# Patient Record
Sex: Female | Born: 1955 | Race: White | Hispanic: No | Marital: Single | Smoking: Former smoker
Health system: Southern US, Community
[De-identification: ages and names within clinical notes are randomized; demographics above are authoritative.]

## PROBLEM LIST (undated history)

## (undated) ENCOUNTER — Emergency Department (HOSPITAL_BASED_OUTPATIENT_CLINIC_OR_DEPARTMENT_OTHER): Admission: EM | Payer: Self-pay | Source: Home / Self Care

## (undated) DIAGNOSIS — F32A Depression, unspecified: Secondary | ICD-10-CM

## (undated) DIAGNOSIS — E119 Type 2 diabetes mellitus without complications: Secondary | ICD-10-CM

## (undated) DIAGNOSIS — G8929 Other chronic pain: Secondary | ICD-10-CM

## (undated) DIAGNOSIS — E785 Hyperlipidemia, unspecified: Secondary | ICD-10-CM

## (undated) DIAGNOSIS — G4733 Obstructive sleep apnea (adult) (pediatric): Secondary | ICD-10-CM

## (undated) DIAGNOSIS — K219 Gastro-esophageal reflux disease without esophagitis: Secondary | ICD-10-CM

## (undated) DIAGNOSIS — F329 Major depressive disorder, single episode, unspecified: Secondary | ICD-10-CM

## (undated) DIAGNOSIS — M549 Dorsalgia, unspecified: Secondary | ICD-10-CM

## (undated) DIAGNOSIS — R52 Pain, unspecified: Secondary | ICD-10-CM

## (undated) DIAGNOSIS — M545 Low back pain, unspecified: Secondary | ICD-10-CM

## (undated) DIAGNOSIS — M47817 Spondylosis without myelopathy or radiculopathy, lumbosacral region: Secondary | ICD-10-CM

## (undated) DIAGNOSIS — Z1231 Encounter for screening mammogram for malignant neoplasm of breast: Secondary | ICD-10-CM

## (undated) HISTORY — DX: Gastro-esophageal reflux disease without esophagitis: K21.9

## (undated) HISTORY — PX: OTHER SURGICAL HISTORY: SHX169

## (undated) HISTORY — PX: BREAST LUMPECTOMY: SHX2

## (undated) HISTORY — DX: Depression, unspecified: F32.A

## (undated) HISTORY — PX: ANKLE SURGERY: SHX546

## (undated) HISTORY — DX: Hyperlipidemia, unspecified: E78.5

## (undated) HISTORY — PX: GALLBLADDER SURGERY: SHX652

## (undated) HISTORY — DX: Obstructive sleep apnea (adult) (pediatric): G47.33

## (undated) HISTORY — DX: Type 2 diabetes mellitus without complications: E11.9

## (undated) HISTORY — PX: KNEE SURGERY: SHX244

## (undated) HISTORY — DX: Major depressive disorder, single episode, unspecified: F32.9

## (undated) HISTORY — PX: BACK SURGERY: SHX140

---

## 1998-04-02 NOTE — ED Provider Notes (Signed)
[facility]                      EMERGENCY DEPARTMENT TREATMENT REPORT   NAME:  Susan Mclaughlin, Susan Mclaughlin   MR#:  [id number]     BILLING #: [id number]         DOS: 04/02/1998  TIME: 6:38                                                                    P   cc:   Haze Justin, M.D.   Primary Physician:   CHIEF COMPLAINT:  Headache, nausea, and cough.   HISTORY OF PRESENT ILLNESS:  This is a 43 year old female with a complaint   of headache, nausea, and cough for the past three days. She describes   pressure in the front of her head and along her nose.  She has also been   nauseous, had some dry heaves and a cough that has been dry but feels as if   there is some congestion in her chest. She is sensitive to the light and   has a lot of retro-orbital pain with no blurred vision.  She also complains   of dizziness when she stands up. She has not been eating much over the past   few days and has been drinking a small amount of fluid.   REVIEW OF SYSTEMS:   CONSTITUTIONAL:  Denies fever, negative for chills, positive for cold   sweat.   EYES:  Positive for photophobia.   ENT:   Positive for nasal congestion, head congestion, question postnasal   drainage.   RESPIRATORY:  Positive for dry cough, no shortness of breath or wheezes.   CARDIOVASCULAR:  Denies any chest pain, pressure, or palpitations.   GASTROINTESTINAL:  Positive for nausea, positive dry heaves, no vomiting,   no abdominal pain, no diarrhea.   GENITOURINARY:  No dysuria, frequency, or urgency.   MUSCULOSKELETAL:  Positive for mild generalized myalgias.   INTEGUMENTARY:  Denies rash.   NEUROLOGICAL:   Positive for headache, see HPI.  Positive for dizziness   when she stands.  No paresthesias, numbness, or weakness.   PAST MEDICAL HISTORY:  She has a hiatal hernia.  Total abdominal   hysterectomy with bilateral salpingo-oophorectomy.   MEDICATIONS:  Prilosec, dicyclomine, estrogen.   ALLERGIES:  Sulfa, erythromycin.    SOCIAL HISTORY:  Positive for tobacco.   PHYSICAL EXAMINATION:   GENERAL:  She is well developed and well nourished.   VITAL SIGNS:  Blood pressure 106/76, pulse 94, respirations 20, temperature   97.7, 02 saturation 100% on room air.   HEENT:  Eyes:  Conjunctiva clear, lids normal.  Pupils equal, symmetrical,   and normally reactive.   ENT:  Canals and TMs clear bilaterally.   Turbinates are swollen, positive nasal congestion.  Nasal mucosa, septum,   and turbinates unremarkable.  Head/face: Very tender over the frontal and   mildly tender over the maxillary sinuses.   NECK:  Supple, symmetrical.  There is some mild bilateral anterior cervical   tenderness.  No discrete lymphadenopathy.   RESPIRATORY:  Clear and equal BS.  No respiratory distress, tachypnea, or   accessory muscle use.   CARDIOVASCULAR:  Heart regular, without murmurs,  gallops, rubs, or thrills.   PMI not displaced.   GASTROINTESTINAL:  Abdomen nondistended, good bowel sounds.  It is soft.   No organomegaly, no masses.   MUSCULOSKELETAL:  Extremity examination is unremarkable.  Nailbeds are pink   with good capillary refill.   SKIN:  Warm and dry without rashes.   NEUROLOGIC:  Cranial nerves, deep tendon reflexes, strength, and light   touch sensation are unremarkable.   CONTINUATION BY DR. Henrene Hawking:   IMPRESSION: Acute cephalgia with sinus congestion.  Rule out sinusitis.   COURSE IN THE EMERGENCY DEPARTMENT:   Intravenous normal saline   established. Patient received a liter, 12.5 of Phenergan, and 12.5 of   Demerol.     Patient with some improvement,  however, still complaining of   headache.  Patient subsequently received repeat dose of Demerol and   Phenergan.   COURSE IN THE EMERGENCY DEPARTMENT:   On re-evaluation,  patient with   frontal sinus congestion and headache, all localized to that area,   involving the frontal and maxillary sinuses.  Neck completely supple.   Neurological examination normal.    PLAN:  Amoxicillin as prescribed.  Vicodin for pain. Entex for congestion.   Return for any worsening.   CONDITION ON DISCHARGE:   Stable.   FINAL DIAGNOSIS:  Acute cephalgia-acute sinusitis.   _________________________________   Haze Justin, M.D.   cd D:  04/02/1998 T:  04/02/1998  7:14 P   [id number]   Fara Chute, PA

## 1998-06-29 NOTE — ED Provider Notes (Signed)
[facility]                      EMERGENCY DEPARTMENT TREATMENT REPORT   NAME:  Susan Mclaughlin, Susan Mclaughlin   MR#:  [id number]     BILLING #: [id number]         DOS: 06/29/1998  TIME: 1:26                                                                    A   cc:   Docia Furl, M.D.   Primary Physician:   The patient was evaluated at 0115 hours.   CHIEF COMPLAINT:  Headache.   HISTORY OF PRESENT ILLNESS:  Onset mild headache this afternoon.  This   evening after watching a movie she sat up and became lightheaded.  Her   headache increased.  Headache is located in the forehead and in the face,   associated with lightheadedness, photosensitivity and nausea.  Symptoms are   the same as prior episode of migraine headaches.  Last episode was in   January of this year.   REVIEW OF SYSTEMS:   CONSTITUTIONAL:  No fever, chills, weight loss.   ENT: No sore throat, runny nose or other URI symptoms.   RESPIRATORY: Occasional cough.   GASTROINTESTINAL:  No vomiting, diarrhea, or abdominal pain.   GENITOURINARY:  No dysuria, frequency, or urgency.   INTEGUMENTARY:  No rashes.   Denies complaints in any other system.   PAST MEDICAL HISTORY:   Pain, left knee; migraine headaches.   FAMILY HISTORY:   Hypercholesterolemia, diabetes, arthritis.   PAST SURGICAL HISTORY:   Appendectomy, cholecystectomy, total abdominal   hysterectomy, bilateral salpingo-oophorectomy, breast cyst, arthroscopy X   two.   SOCIAL HISTORY:   Single.   ALLERGIES:  Sulfa, erythromycin.   MEDICATIONS:  Allegra D, Premarin, Prilosec, Bentyl.   PHYSICAL EXAMINATION:   GENERAL:   The patient  is a well developed, well nourished female.   VITAL SIGNS:  Blood pressure 121/78, pulse 93, respirations 16, and   temperature 97.   HEENT:   Face-Generalized tenderness of the forehead.  Eyes:  Conjunctiva   clear, lids normal.  Pupils equal, symmetrical, and normally reactive.    Ears/Nose:  Hearing is grossly intact to voice.  Internal and external   examinations of the ears are unremarkable.     Mouth/Throat:  Surfaces of   the pharynx, palate, and tongue are pink, moist, and without lesions.   NECK:  Supple, nontender, symmetrical, no masses or JVD, trachea midline,   thyroid not enlarged, nodular, or tender.   No cervical or submandibular lymphadenopathy palpated.   LUNGS:  Scattered rhonchi.   GI:  Abdomen soft, nontender, without complaint of pain to palpation.  No   hepatomegaly or splenomegaly.   No abdominal or inguinal masses appreciated   by inspection or palpation.   NEUROLOGIC:  Cranial nerves, deep tendon reflexes, strength, and light   touch sensation are unremarkable.   CONTINUATION BY DR. Manson Passey:   History and physical per physician assistant. I agree with the above.   IMPRESSION/MANAGEMENT PLAN:   This is a lady with a history of migraine   headaches, having a migraine headache similar to headaches in  the past.   After reviewing old records we see that she has been here with similar   symptoms here within about the last 8 months.  She is not having any neuro   deficits, has no focal deficits whatsoever.  Is alert and oriented times   three.   This appears to be an acute exacerbation of a chronic problem.   COURSE IN THE EMERGENCY DEPARTMENT:   An IV was placed, and she was given   approximately 25 mg of Benadryl and 10 mg of Compazine.  We also bolused   her with one liter of fluid.  I frequently checked on her between 1:30 and   3:00 in the morning, and by the time a full liter of fluid had gone in, she   was alert, oriented, and the pain was essentially gone.  At this point, she   is stable for discharge.  There was no diagnostic studies done.   FINAL DIAGNOSIS:   1.  Acute cephalgia.   DISPOSITION:   Home on Tylenol No. 3 and fluids.  She is going to follow-up   on the free clinic.  If her symptosm worsen, of course she can return to   the Emergency Room.    ____________________________   Docia Furl, M.D.   jb/ec  D:  06/29/1998 T:  06/29/1998  2:06 A   [id number]   Blanchard Mane, PA

## 1998-10-12 NOTE — Op Note (Signed)
[facility]                                OPERATION REPORT   NAME:     Susan Mclaughlin, Susan Mclaughlin   MR#:      [id number]                          DATE:             10/01/1998   SS#       [id number]                       PT. LOCATION:     OR  OR36   Harvie Heck, M.D.   cc:   Harvie Heck, M.D.   [facility] [address]                     [place], [place] [address]   PREOPERATIVE DIAGNOSIS:   Left knee locking   POSTOPERATIVE DIAGNOSIS:   Lateral meniscus tear; chondral cracking of trochlea.   OPERATION:   Arthroscopic partial lateral meniscectomy   SURGEON:   Dr. Janene Harvey   ASSISTANT:   Cindee Lame   ANESTHESIA:   General   TOURNIQUET TIME:   Approximately 20 minutes   INDICATIONS:  The patient is a 43 year old female.  She is status post   injury to her knee in the remote past.  She had an MRI which revealed   perhaps a tear of the anterior horn of the medial meniscus.  She complained   of locking and catching.  An intraarticular injection of lidocaine had   totally eliminated  the pain and she requested arthroscopic evaluation and   correction of any intraarticular problems.   FINDINGS:  There was a lateral meniscus that was somewhat discoid in   appearance.  The posterior one half and inner one half appeared to have   some flap tearing which was unstable and was impinging in the joint. There   were very mild chondral lesions on the lateral femoral condyle and lateral   tibial plateau corresponding to this.  The remainder of the intraarticular   structures were normal.   DESCRIPTION OF PROCEDURE:  The patient was brought to the operating room   suite.  General anesthesia  was smoothly induced.  The knee was examined   and felt to be stable.  It was prepped and draped in a sterile fashion.   Over a thigh tourniquet and a leg holder an Esmarch bandage was applied.   The tourniquet was inflated to 325 mm/hg.  Through anterolateral and   anteromedial portals the  knee was examined and treated arthroscopically.   The suprapatellar pouch was benign.  The  patella was normal.  The trochlea   showed some very mild frayed  two and three chondral cracks.   The medial   joint space was entered.  The femoral condyle, tibial plateau and meniscus   were examined, probed and found to be normal.  The anterior horn was   inspected and felt to be normal.  The notch was entered.  The anterior   cruciate ligament and posterior cruciate ligament were intact.  The lateral   joint space was entered and the above findings were noted.  Utilizing   arthroscopic baskets and  shavers the meniscus was trimmed to a smooth,   stable rim.  All loose fragments were vacuumed from the knee.  The knee was   well irrigated.  The portal sites were closed with 4-0 nylon.  20 cc of   1/4% Marcaine with epinephrine along with 3 mg of Duramorph was injected   into the knee and a sterile compressive dressing was applied.  The   tourniquet was released; the patient was awakened and taken to the recovery   room in good condition.  There were no complications.  Blood loss was   minimal.  Postoperative plan is to rehabilitate the patient's knee.   ___________________________   Harvie Heck, M.D.   Keltoi.Punches  D:  10/01/1998  T:  10/12/1998 12:59 P   [id number]

## 1998-10-12 NOTE — Op Note (Signed)
[facility]                                OPERATION REPORT   NAME:     Susan Mclaughlin, Susan Mclaughlin   MR#:      [id number]                          DATE:             10/01/1998   SS#       [id number]                       PT. LOCATION:     OR  OR36   Harvie Heck, M.D.   cc:   Harvie Heck, M.D.   [facility] - [address]                     [place], [place] [address]   PREOPERATIVE DIAGNOSIS:   Left knee locking   POSTOPERATIVE DIAGNOSIS:   Lateral meniscus tear; chondral cracking of trochlea.   OPERATION:   Arthroscopic partial lateral meniscectomy   SURGEON:   Dr. Janene Harvey   ASSISTANT:   Cindee Lame   ANESTHESIA:   General   TOURNIQUET TIME:   Approximately 20 minutes   INDICATIONS:  The patient is a 43 year old female.  She is status post   injury to her knee in the remote past.  She had an MRI which revealed   perhaps a tear of the anterior horn of the medial meniscus.  She complained   of locking and catching.  An intraarticular injection of lidocaine had   totally eliminated  the pain and she requested arthroscopic evaluation and   correction of any intraarticular problems.   FINDINGS:  There was a lateral meniscus that was somewhat discoid in   appearance.  The posterior one half and inner one half appeared to have   some flap tearing which was unstable and was impinging in the joint. There   were very mild chondral lesions on the lateral femoral condyle and lateral   tibial plateau corresponding to this.  The remainder of the intraarticular   structures were normal.   DESCRIPTION OF PROCEDURE:  The patient was brought to the operating room   suite.  General anesthesia  was smoothly induced.  The knee was examined   and felt to be stable.  It was prepped and draped in a sterile fashion.   Over a thigh tourniquet and a leg holder an Esmarch bandage was applied.   The tourniquet was inflated to 325 mm/hg.  Through anterolateral and    anteromedial portals the knee was examined and treated arthroscopically.   The suprapatellar pouch was benign.  The  patella was normal.  The trochlea   showed some very mild frayed  two and three chondral cracks.   The medial   joint space was entered.  The femoral condyle, tibial plateau and meniscus   were examined, probed and found to be normal.  The anterior horn was   inspected and felt to be normal.  The notch was entered.  The anterior   cruciate ligament and posterior cruciate ligament were intact.  The lateral   joint space was entered and the above findings were noted.  Utilizing   arthroscopic baskets and  shavers the meniscus was trimmed to a smooth,   stable rim.  All loose fragments were vacuumed from the knee.  The knee was   well irrigated.  The portal sites were closed with 4-0 nylon.  20 cc of   1/4% Marcaine with epinephrine along with 3 mg of Duramorph was injected   into the knee and a sterile compressive dressing was applied.  The   tourniquet was released; the patient was awakened and taken to the recovery   room in good condition.  There were no complications.  Blood loss was   minimal.  Postoperative plan is to rehabilitate the patient's knee.   ___________________________   Harvie Heck, M.D.   Keltoi.Punches  D:  10/01/1998  T:  10/12/1998 12:59 P   [id number]

## 1998-10-28 NOTE — Procedures (Signed)
[facility]                         PERIPHERAL VASCULAR LABORATORY   NAME:       Rouser, Aolani A               DATE:     10/28/1998   AGE/DOB:    43  /  11/28/1955                 ROOM #:   OP   SEX:        F                                 MR#:      [id number]   REFERRING PHYSICIAN:      SHELDON L. COHN, M.D.   CHIEF COMPLAINT/SYMPTOMS:  Left leg pain and swelling   EXAMINATION:  LOWER EXTREMITY VENOUS   INTERPRETATION:  Duplex demonstrates no evidence of left lower extremity or   right common femoral deep venous thrombosis and doppler survey demonstrates   reflux in the left posterior tibial vein.   IMPRESSION:   1.  No evidence of left lower extremity or right common femoral deep venous       thrombosis.   2.  Right posterior tibial venous valvular incompetency.   ______________________________________________   CHARLES SCOTT MCENROE, M.D.   jcb  D: 10/28/1998  T: 10/29/1998  7:22 A       Verified by:______

## 1998-10-29 NOTE — Procedures (Signed)
[facility]                         PERIPHERAL VASCULAR LABORATORY   NAME:       [name] A               DATE:     10/28/1998   AGE/DOB:    43  /  Mar 24, 1955                 ROOM #:   OP   SEX:        F                                 MR#:      [id number]   REFERRING PHYSICIAN:      Harvie Heck, M.D.   CHIEF COMPLAINT/SYMPTOMS:  Left leg pain and swelling   EXAMINATION:  LOWER EXTREMITY VENOUS   INTERPRETATION:  Duplex demonstrates no evidence of left lower extremity or   right common femoral deep venous thrombosis and doppler survey demonstrates   reflux in the left posterior tibial vein.   IMPRESSION:   1.  No evidence of left lower extremity or right common femoral deep venous       thrombosis.   2.  Right posterior tibial venous valvular incompetency.   ______________________________________________   Randall An, M.D.   jcb  D: 10/28/1998  T: 10/29/1998  7:22 A       Verified by:______

## 1998-12-22 NOTE — ED Provider Notes (Signed)
[facility]                      EMERGENCY DEPARTMENT TREATMENT REPORT   OUTPATIENT CENTER ASSIGNMENT   NAME:  TINSLEY, EVERMAN   MR #:  [id number]   BILLING #: [id number]        DOS: 12/22/1998  TIME:11:10 P   cc:   [facility] COPY   Primary Physician:   CHIEF COMPLAINT:  Chest pain.   HISTORY OF PRESENT ILLNESS:   The patient is a 43 year old female who   states she was in line at the [employer] for food, started having a dull   substernal chest pain, nausea, felt a little diaphoretic.  Did not faint,   but she was very scared by the pain she had in her chest.  She did not have   any shortness of breath at the time.  States she has never had this happen   before.  She had similar symptoms once before with her hiatal hernia, but   she had that repaired and has not had problems since then.  Currently, she   states she is a little nauseous.  The chest pain is essentially gone and   she is feeling better, but it was an overwhelming feeling, she states.   States that she can't really rate the pain from a 1 to 10.  It was a dull   pressure, again sitting in the middle of her chest with no radiation to the   left arm or neck.   REVIEW OF SYSTEMS:   CONSTITUTIONAL:  No fever, chills, weight loss.   EYES: No visual symptoms.   ENT: No sore throat, runny nose or other URI symptoms.   ENDOCRINE:  No diabetic symptoms.   HEMATOLOGIC/LYMPHATIC:  No excessive bruising or lymph node swelling.   ALLERGIC/IMMUNOLOGIC:  No urticaria or allergy symptoms.   RESPIRATORY:  No cough, shortness of breath, or wheezing.   GENITOURINARY:  No dysuria, frequency, or urgency.   MUSCULOSKELETAL:  No joint pain or swelling.   INTEGUMENTARY:  No rashes.   GASTROINTESTINAL:  Did not have abdominal pain, but had nausea.   Otherwise:  All other systems negative.   PAST MEDICAL HISTORY:   She has a history of reflux, hiatal hernia.   SOCIAL HISTORY:  She smokes.    FAMILY HISTORY:   She has no cardiac family history.   PAST SURGICAL HISTORY:   She has had an appendectomy, hiatal hernia repair,   and a hysterectomy.   PHYSICAL EXAMINATION:   VITAL SIGNS:  stable.  She appears comfortable.   GENERAL APPEARANCE:  Patient appears well-developed and well-nourished.   Appearance and behavior are age and situation appropriate.   HEENT:  Eyes:  Conjunctiva clear, lids normal.  Pupils equal, symmetrical,   and normally reactive.     Ears/Nose:  Hearing is grossly intact to voice.   Internal and external examinations of the ears are unremarkable.   LYMPHATICS:   No cervical or submandibular lymphadenopathy palpated.   RESPIRATORY:  Clear and equal BS.  No respiratory distress, tachypnea, or   accessory muscle use.   CARDIOVASCULAR:  Heart regular, without murmurs, gallops, rubs, or thrills.   PMI not displaced.   GI:  Abdomen soft, nontender, without complaint of pain to palpation.  No   hepatomegaly or splenomegaly.   EXTREMITIES:  No clubbing, cyanosis, or edema.   PSYCHIATRIC:  Judgment appears appropriate.   Recent and  remote memory   appear to be intact.  Oriented to time, place and person.  Mood and affect   appropriate.   SKIN:  Warm and dry without rashes.   Gait not tested.   IMPRESSION/MANAGEMENT PLAN:   The patient with 15 to 20 minutes of chest   pain earlier which did not come on with exercise.  The patient is a smoker   which is her main risk factor.  Patient with chest pain.  Acute ischemic   coronary disease must be considered first, and the patient protected   against the consequences of same, while other etiologies (including   infectious, metabolic, pulmonary, GI, and musculoskeletal) are considered.   DIAGNOSTIC TESTING:  Chest x-ray was clear.  EKG normal sinus rhythm.   Her   myoglobin was 20, CPK was 72, troponin 0.0.   She had a white blood count   of 10.4, H&amp;H of 12 and 36.  Electrolytes were normal.  Sodium 134,   potassium 3.8, chloride 101, CO2 26.    COURSE IN THE EMERGENCY DEPARTMENT:  She received aspirin,  as well as some   medication for nausea here in the Emergency Department.  She is much more   comfortable at this time.  I explained to her she will be going to the [facility]   for a stress test in the morning and rule out protocol.   FINAL DIAGNOSIS:  Chest pain.   DISPOSITION:   The initial Emergency Department evaluation of this patient   appears to be negative for evidence of an acute coronary ischemia requiring   hospital admission or urgent intervention.  However, ischemic coronary   disease has not been eliminated as a consideration.  Consequently, the   patient will be assigned to the Outpatient Center for serial cardiac   enzymes and, if these are negative, resting and stress echocardiography or   other additional diagnostic testing.    The patient is in stable condition.   ____________________________   Docia Furl, M.D.   jb D:  12/22/1998 T:  12/22/1998 11:13 P   [id number]

## 1998-12-22 NOTE — Procedures (Signed)
[facility]                          STRESS ECHOCARDIOGRAM REPORT   NAME:    Susan Mclaughlin, Susan Mclaughlin                          SS#:        [id number]   13   DOB:     Feb 14, 1956                                   AGE:        43   SEX:     F                                            ROOM#:      [id number]   MR#:     [id number]                                     DATE:       10/12/200   0   REFERRING PHYS:N. BrownTAPE/INDEX:                    121/4510   PRETEST DATA:   INDICATION:  Chest pain   MEDS TAKEN:  --   MEDS HELD:  Prilosec; Estrogen; Zyrtec   TARGET HEART RATE:  175     85%:  149   RISK FACTORS:  Cigarette smoking habit (1 pack/day)   BASELINE ECG:  Normal sinus rhythm; within normal limits   EXERCISE SUPERVISED BY:  Monico Blitz, RN, CS, FNP-C   TEST RESULTS:           BRUCE PROTOCOL     STAGE    SPEED (MPH)   GRADE (%)   TIME (MIN:SEC)      HR       BP   Resting                                                  72     110/76       1          1.7           10           3:00          100     132/80       2          2.5           12           3:00          121     138/88       3          3.4           14           1:00          139     ---/--  Recovery                                Immediate       139     130/80                                             2:00           84     140/80                                             4:00           78     120/80                                             6:00           76     112/74   REASON FOR STOPPING:  achieved target HR        TOTAL EXERCISE TIME:  7:00   ACHIEVED HEART RATE:  139 (79%  max HR)         EST. METS:  7-8   HR RESPONSE:  did not reach adequate HR         PEAK RPP:  19,182   BP RESPONSE:  Normal                            CHEST PAIN:  None   OBSERVED DYSRHYTHMIAS:  One PVC   ST SEGMENT CHANGES:  None                               WALL MOTION ANALYSIS    LV WALL SEGMENT              PRE-EXERCISE            POST-EXERCISE   Basal Anteroseptal              Normal                Hyperkinesis   Basal Septal                    Normal                Hyperkinesis   Basal Inferoseptal              Normal                Hyperkinesis   Basal Posterior                 Normal                Hyperkinesis   Basal Lateral                   Normal                Hyperkinesis   Basal Anterior  Normal                Hyperkinesis   Mid Anteroseptal                Normal                Hyperkinesis   Mid Septal                      Normal                Hyperkinesis   Mid Inferoseptal                Normal                Hyperkinesis   Mid Posterior                   Normal                Hyperkinesis   Mid Lateral                     Normal                Hyperkinesis   Mid Anterior                    Normal                Hyperkinesis   Apical Septal                   Normal                Hyperkinesis   Apical Inferior                 Normal                Hyperkinesis   Apical Lateral                  Normal                Hyperkinesis   Apical Anterior                 Normal                Hyperkinesis   LV  Chamber Size                                        Smaller   ECG INTERPRETATION:  Inadequate, but negative to achieved HR.   ECHO INTERPRETATION:  Normal; Negative to achieved HR.   OVERALL IMPRESSION:  Negative for ischemia at suboptimal HR.

## 1998-12-22 NOTE — Discharge Summary (Signed)
[facility]                       OUTPATIENT CENTER DISCHARGE SUMMARY   MORGANE, JOERGER   MR#:  [id number]     BILLING #: [id number]     DOS: 12/22/1998  DOD:12/23/1998   TIME: 11:33 A   cc:   Primary Physician:   SUMMARY:  The patient is a 43 year old female who was seen by Dr. Altamease Oiler last evening within the Emergency Department. The patient presented   with dull substernal chest pain that was associated with diaphoresis and   which was of unclear etiology.  The patient's initial studies were found to   be normal and the possibility of an acute cardiac cause was still   considered.  The patient was placed in the OPPU where she was monitored and   she remained in stable condition.  Her serial cardiac enzymes were normal   and for that reason the patient was taken for a stress echocardiogram   earlier this morning and this did return normal as interpreted by the   cardiologist with no evidence of coronary disease within the sensitivity of   the testing performed.  The patient was pain free at the time she was seen   by me.   PHYSICAL EXAMINATION:   GENERAL:  The patient was alert and conversant.   VITAL SIGNS:  Blood pressure 99/54, pulse 73, respiratory rate equal to 16.   NECK:  Supple.   LUNGS:  Clear to auscultation.   CHEST WALL:  Normal without appreciable tenderness noted.   CARDIOVASCULAR:  Heart regular, without murmurs, gallops, rubs, or thrills.   PMI not displaced. GI:  Abdomen soft, nontender, without complaint of pain   to palpation.  No hepatomegaly or splenomegaly.   EXTREMITIES:  The patient had no calf tenderness noted.  Cords were not   palpable and Homans signs were not present.   DISCUSSION:  The patient did well with the protocol and has no evidence of   acute cardiac condition within the sensitivity of this stress   echocardiogram.  The patient was advised to return for any complications    related to this presentation.  She was strongly advised to discontinue   smoking and to follow up with her primary physician as needed for further   care.   FINAL DIAGNOSIS:  Acute chest pain evaluation, noncardiac per stress   echocardiogram.   DISPOSITION:  The patient was discharged in stable condition and will   follow up as outlined above.   ____________________________   Damien Fusi, M.D.   cd D:  12/23/1998 T:  12/24/1998  1:14 P   [id number]

## 1998-12-22 NOTE — ED Provider Notes (Signed)
[facility]                      EMERGENCY DEPARTMENT TREATMENT REPORT   NAME:  CADEN, FATICA   MR #:  [id number]   BILLING #: [id number]        DOS: 12/21/1998  TIME: 9:33 P   cc:   Primary Physician:   The patient was seen at 2120 hours.   CHIEF COMPLAINT:   Low back pain.   HISTORY OF PRESENT ILLNESS:  This 43 year old female presents with a   two-week history of bilateral low back pain in the sacral area which is   10/10, radiates to the left hip, is sometimes associated with some tingling   in the toes, but no pain in the legs.  No known trauma to the back.   Tylenol has not helped.  No other complaints.   REVIEW OF SYSTEMS:   CONSTITUTIONAL:  No fever, chills, weight loss.   ENDOCRINE:  No diabetic symptoms.   HEMATOLOGIC/LYMPHATIC:  No excessive bruising or lymph node swelling.   RESPIRATORY:  No cough, shortness of breath, or wheezing.   CARDIOVASCULAR:  No chest pain, chest pressure, or palpitations.   GASTROINTESTINAL:  No vomiting, diarrhea, or abdominal pain.   GENITOURINARY:  Frequency, no dysuria or urgency or hematuria.   Denies complaints in any other system.   PAST MEDICAL HISTORY:   Irritable bowel syndrome, migraines.   MEDICATIONS:  Premarin and Prilosec.   ALLERGIES:   E-mycin, sulfa.   She says Tylenol No. 3 gives her migraines.   PHYSICAL EXAMINATION:   GENERAL:  The patient is a 43 year old female who was weeping when I   entered the room.   VITAL SIGNS:   Blood pressure 114/62, pulse 88, respirations 18, and   temperature 97.3.   HEENT:  Eyes-Pupils equal, round, reactive to light and accommodation.  ;   Extraocular movements are intact.   RESPIRATORY:  Lungs clear to auscultation with full excursion bilaterally.   CARDIOVASCULAR:   Heart regular rate and rhythm, no significant murmurs,   rubs, or gallops.   DP pulses 2+ and equal bilaterally.    No peripheral   edema or significant varicosities.   GASTROINTESTINAL:  Bowel sounds X four.    ABDOMEN:  Soft, supple, nontender, nondistended.  Minimal left   costovertebral angle tenderness noted.   MUSCULOSKELETAL:  Neck and back-Midline without any vertebral tenderness.   Bilateral sacral musculature is tender to palpation, left greater than   right.  Negative straight leg raising.  Stance and gait appear normal.   SKIN:  Warm and dry without rashes.   NEUROLOGIC:  Cranial nerves, deep tendon reflexes, strength, and light   touch sensation are unremarkable.   DIAGNOSTIC TESTING:  Urine dip was performed and was negative.   FINAL DIAGNOSIS:  Lumbosacral strain.   DISPOSITION:   The patient was seen by Dr. Clydene Pugh who agrees with the   assessment and plan.  The patient was discharged to home with verbal and   written instructions and a referral to Orthopaedics for follow-up if no   better in one week.  She is instructed to rest and apply moist heat to the   affected area    She was given a shot of Toradol in  the Emergency   Department and sent home with instructions to use Motrin for discomfort,   also given a prescription for Vicodin, #6.   Electronically Signed By:  Lucita Ferrara, M.D. 12/26/1998 12:16   ____________________________   Lucita Ferrara, M.D.   Dictated by:  Dineen Kid, PA-C   jb D:  12/21/1998 T:  12/22/1998 11:59 P   [id number]

## 1998-12-22 NOTE — Procedures (Signed)
[facility]                          STRESS ECHOCARDIOGRAM REPORT   NAME:    Susan Mclaughlin, Susan Mclaughlin                          SS#:        [id number]   13   DOB:     03/17/1955                                   AGE:        43   SEX:     F                                            ROOM#:      [id number]   MR#:     [id number]                                     DATE:       10/12/200   0   REFERRING PHYS:N. BrownTAPE/INDEX:                    121/4510   PRETEST DATA:   INDICATION:  Chest pain   MEDS TAKEN:  --   MEDS HELD:  Prilosec; Estrogen; Zyrtec   TARGET HEART RATE:  175     85%:  149   RISK FACTORS:  Cigarette smoking habit (1 pack/day)   BASELINE ECG:  Normal sinus rhythm; within normal limits   EXERCISE SUPERVISED BY:  Allison Perkins, RN, CS, FNP-C   TEST RESULTS:           BRUCE PROTOCOL     STAGE    SPEED (MPH)   GRADE (%)   TIME (MIN:SEC)      HR       BP   Resting                                                  72     110/76       1          1.7           10           3:00          100     132/80       2          2.5           12           3:00          121     138/88       3          3.4           14           1:00          139     ---/--     Recovery                                Immediate       139     130/80                                             2:00           84     140/80                                             4:00           78     120/80                                             6:00           76     112/74   REASON FOR STOPPING:  achieved target HR        TOTAL EXERCISE TIME:  7:00   ACHIEVED HEART RATE:  139 (79%  max HR)         EST. METS:  7-8   HR RESPONSE:  did not reach adequate HR         PEAK RPP:  19,182   BP RESPONSE:  Normal                            CHEST PAIN:  None   OBSERVED DYSRHYTHMIAS:  One PVC   ST SEGMENT CHANGES:  None                               WALL MOTION ANALYSIS    LV WALL SEGMENT              PRE-EXERCISE            POST-EXERCISE   Basal Anteroseptal              Normal                Hyperkinesis   Basal Septal                    Normal                Hyperkinesis   Basal Inferoseptal              Normal                Hyperkinesis   Basal Posterior                 Normal                Hyperkinesis   Basal Lateral                   Normal                Hyperkinesis   Basal Anterior                    Normal                Hyperkinesis   Mid Anteroseptal                Normal                Hyperkinesis   Mid Septal                      Normal                Hyperkinesis   Mid Inferoseptal                Normal                Hyperkinesis   Mid Posterior                   Normal                Hyperkinesis   Mid Lateral                     Normal                Hyperkinesis   Mid Anterior                    Normal                Hyperkinesis   Apical Septal                   Normal                Hyperkinesis   Apical Inferior                 Normal                Hyperkinesis   Apical Lateral                  Normal                Hyperkinesis   Apical Anterior                 Normal                Hyperkinesis   LV  Chamber Size                                        Smaller   ECG INTERPRETATION:  Inadequate, but negative to achieved HR.   ECHO INTERPRETATION:  Normal; Negative to achieved HR.   OVERALL IMPRESSION:  Negative for ischemia at suboptimal HR.

## 1998-12-23 NOTE — Procedures (Signed)
[facility]                          STRESS ECHOCARDIOGRAM REPORT   NAME:    Susan Mclaughlin, Susan Mclaughlin                          SS#:        [id number]   13   DOB:     1955-12-28                                   AGE:        43   SEX:     F                                            ROOM#:      [id number]   MR#:     [id number]                                     DATE:       10/12/200   0   REFERRING PHYS:N. BrownTAPE/INDEX:                    121/4510   PRETEST DATA:   INDICATION:  Chest pain   MEDS TAKEN:  --   MEDS HELD:  Prilosec; Estrogen; Zyrtec   TARGET HEART RATE:  175     85%:  149   RISK FACTORS:  Cigarette smoking habit (1 pack/day)   BASELINE ECG:  Normal sinus rhythm; within normal limits   EXERCISE SUPERVISED BY:  Monico Blitz, RN, CS, FNP-C   TEST RESULTS:           BRUCE PROTOCOL     STAGE    SPEED (MPH)   GRADE (%)   TIME (MIN:SEC)      HR       BP   Resting                                                  72     110/76       1          1.7           10           3:00          100     132/80       2          2.5           12           3:00          121     138/88       3          3.4           14           1:00          139     ---/--  Recovery                                Immediate       139     130/80                                             2:00           84     140/80                                             4:00           78     120/80                                             6:00           76     112/74   REASON FOR STOPPING:  achieved target HR        TOTAL EXERCISE TIME:  7:00   ACHIEVED HEART RATE:  139 (79%  max HR)         EST. METS:  7-8   HR RESPONSE:  did not reach adequate HR         PEAK RPP:  19,182   BP RESPONSE:  Normal                            CHEST PAIN:  None   OBSERVED DYSRHYTHMIAS:  One PVC   ST SEGMENT CHANGES:  None                               WALL MOTION ANALYSIS   LV WALL SEGMENT              PRE-EXERCISE             POST-EXERCISE   Basal Anteroseptal              Normal                Hyperkinesis   Basal Septal                    Normal                Hyperkinesis   Basal Inferoseptal              Normal                Hyperkinesis   Basal Posterior                 Normal                Hyperkinesis   Basal Lateral                   Normal                Hyperkinesis   Basal Anterior  Normal                Hyperkinesis   Mid Anteroseptal                Normal                Hyperkinesis   Mid Septal                      Normal                Hyperkinesis   Mid Inferoseptal                Normal                Hyperkinesis   Mid Posterior                   Normal                Hyperkinesis   Mid Lateral                     Normal                Hyperkinesis   Mid Anterior                    Normal                Hyperkinesis   Apical Septal                   Normal                Hyperkinesis   Apical Inferior                 Normal                Hyperkinesis   Apical Lateral                  Normal                Hyperkinesis   Apical Anterior                 Normal                Hyperkinesis   LV  Chamber Size                                        Smaller   ECG INTERPRETATION:  Inadequate, but negative to achieved HR.   ECHO INTERPRETATION:  Normal; Negative to achieved HR.   OVERALL IMPRESSION:  Negative for ischemia at suboptimal HR.

## 1998-12-23 NOTE — Procedures (Signed)
[facility]                          STRESS ECHOCARDIOGRAM REPORT   NAME:    Susan Mclaughlin, Susan Mclaughlin                          SS#:        [id number]   13   DOB:     02/26/1956                                   AGE:        43   SEX:     F                                            ROOM#:      [id number]   MR#:     [id number]                                     DATE:       10/12/200   0   REFERRING PHYS:N. BrownTAPE/INDEX:                    121/4510   PRETEST DATA:   INDICATION:  Chest pain   MEDS TAKEN:  --   MEDS HELD:  Prilosec; Estrogen; Zyrtec   TARGET HEART RATE:  175     85%:  149   RISK FACTORS:  Cigarette smoking habit (1 pack/day)   BASELINE ECG:  Normal sinus rhythm; within normal limits   EXERCISE SUPERVISED BY:  Allison Perkins, RN, CS, FNP-C   TEST RESULTS:           BRUCE PROTOCOL     STAGE    SPEED (MPH)   GRADE (%)   TIME (MIN:SEC)      HR       BP   Resting                                                  72     110/76       1          1.7           10           3:00          100     132/80       2          2.5           12           3:00          121     138/88       3          3.4           14           1:00          139     ---/--     Recovery                                Immediate       139     130/80                                             2:00           84     140/80                                             4:00           78     120/80                                             6:00           76     112/74   REASON FOR STOPPING:  achieved target HR        TOTAL EXERCISE TIME:  7:00   ACHIEVED HEART RATE:  139 (79%  max HR)         EST. METS:  7-8   HR RESPONSE:  did not reach adequate HR         PEAK RPP:  19,182   BP RESPONSE:  Normal                            CHEST PAIN:  None   OBSERVED DYSRHYTHMIAS:  One PVC   ST SEGMENT CHANGES:  None                               WALL MOTION ANALYSIS   LV WALL SEGMENT              PRE-EXERCISE             POST-EXERCISE   Basal Anteroseptal              Normal                Hyperkinesis   Basal Septal                    Normal                Hyperkinesis   Basal Inferoseptal              Normal                Hyperkinesis   Basal Posterior                 Normal                Hyperkinesis   Basal Lateral                   Normal                Hyperkinesis   Basal Anterior                    Normal                Hyperkinesis   Mid Anteroseptal                Normal                Hyperkinesis   Mid Septal                      Normal                Hyperkinesis   Mid Inferoseptal                Normal                Hyperkinesis   Mid Posterior                   Normal                Hyperkinesis   Mid Lateral                     Normal                Hyperkinesis   Mid Anterior                    Normal                Hyperkinesis   Apical Septal                   Normal                Hyperkinesis   Apical Inferior                 Normal                Hyperkinesis   Apical Lateral                  Normal                Hyperkinesis   Apical Anterior                 Normal                Hyperkinesis   LV  Chamber Size                                        Smaller   ECG INTERPRETATION:  Inadequate, but negative to achieved HR.   ECHO INTERPRETATION:  Normal; Negative to achieved HR.   OVERALL IMPRESSION:  Negative for ischemia at suboptimal HR.

## 1999-06-15 NOTE — ED Provider Notes (Signed)
[facility]                      EMERGENCY DEPARTMENT TREATMENT REPORT   NAME:  Susan Mclaughlin, Susan Mclaughlin   MR #:  [id number]   BILLING #: [id number]        DOS: 06/11/1999  TIME: 2:15 P   cc:   Primary Physician:   CHIEF COMPLAINT:   Continued sinus congestion, drainage and cough.   HISTORY OF PRESENT ILLNESS:   The patient is a 44 year old female sinus   congestion, drainage, cough over the last 2 weeks time. She has been on   amoxicillin and questionable Bactrim last week.  Complains of continued   sinus congestion and drainage as well as cough productive  of greenish   sputum production. She has also had occasional wheezing.  Denies associated   fevers, significant shortness of breath.   REVIEW OF SYSTEMS:   ENT:  Positive as noted.   RESPIRATORY:  Positive as noted.   PAST MEDICAL HISTORY:  Remarkable for asthma and environmental allergies.   SOCIAL HISTORY:  The patient smokes less than one pack per day of   cigarettes.   PHYSICAL EXAMINATION:   GENERAL:   The patient is alert, cooperative.   VITAL SIGNS:  Blood pressure 129/76, pulse rate 83, respiratory rate 18,   temperature 98.4.  O2 saturations 99% on room air.   HEENT:   Eyes:  Conjunctiva clear, lids normal.  Pupils equal, symmetrical,   and normally reactive.   Mouth/Throat:  Surfaces of the pharynx, palate,   and tongue are pink, moist, and without lesions.   Bilateral maxillary   sinus tenderness as noted to palpation. Bilateral TMs clear.   NECK:  Supple, nontender, symmetrical, no masses or JVD, trachea midline,   thyroid not enlarged, nodular, or tender.   RESPIRATORY:  Clear and equal BS.  No respiratory distress, tachypnea, or   accessory muscle use.   CARDIOVASCULAR:  Heart regular, without murmurs, gallops, rubs, or thrills.   PMI not displaced.   GI:  Abdomen soft, nontender, without complaint of pain to palpation.  No   hepatomegaly or splenomegaly.    MUSCULOSKELETAL:    Nails:  No clubbing or deformities.  Nailbeds pink with   prompt capillary refill.   SKIN:  Warm and dry without rashes.   FINAL DIAGNOSIS:   1.  Acute sinusitis and bronchitis.   DISPOSITION:   The patient was discharged home with prescription for   Zithromax and Medrol dose pack to be taken as directed.  She is also given   a prescription for Entex LA.  She is to follow with her primary care   physician. She is to be re-evaluated for worsening of symptoms.   Electronically Signed By:   Erlinda Hong, M.D. 06/25/1999 14:07   ____________________________   TODD Wilfrid Lund, M.D.   dh D:  06/11/1999 T:  06/15/1999  1:49 P   [id number]

## 2000-08-16 NOTE — Procedures (Signed)
[facility]                           ELECTROENCEPHALOGRAM REPORT   NAME:     Susan Mclaughlin, Susan Mclaughlin                           DATE:     08/16/2000   EEG#:     [id number]                                      MR#:      [id number]   ROOM:     OP                                          DOB:      08-13-1955   REFERRING PHYSICIAN:    Steele Sizer   COMMENTS: EEG has been requested for  evaluation  of  a  seizure  disorder.   The patient apparently had an episode of passing out about 2 weeks ago.   MEDICATIONS:  Estratest, Detrol, Prevacid, Dilantin.   This  is a 21-channel digitally obtained  EEG  tracing  obtained  with  the   surface electrodes placed according  to  the  international  classification   system of 10/20.  This is an awake EEG  tracing  with periods of drowsiness   and light sleep.  There was no well  defined  alpha  rhythm  noted  in  the   posterior head region and the predominant background rhythm in this tracing   was low to medium voltage fast beta  activity.   During  the  drowsy period   there was symmetrical attenuation of the  background rhythms and during the   light sleep stages vortex sharp transient  waves were seen.  The background   rhythms appeared to be symmetrical over  both hemispheres without any focal   slowing  or  any  focal  features. There  were  no  paroxysmal  discharges,   abnormal sharp waves, spikes or spike  and  wave  discharges  noted in this   tracing.    Photic  stimulation  did   not   produce   any   abnormalities.   Hyperventilation was not performed.   IMPRESSION:  This is an awake EEG tracing  with  periods  of drowsiness and   light sleep which is felt to be within  normal  limits. There was excessive   beta activity which is either a normal  variant  or from medication effect.   There  were  no  focal  features or paroxysmal  discharges  noted  in  this   tracing.

## 2000-08-16 NOTE — Procedures (Signed)
[facility]                           ELECTROENCEPHALOGRAM REPORT   NAME:     Susan Mclaughlin, Susan Mclaughlin                           DATE:     08/16/2000   EEG#:     [id number]                                      MR#:      [id number]   ROOM:     OP                                          DOB:      06/07/1955   REFERRING PHYSICIAN:    H. Shah   COMMENTS: EEG has been requested for  evaluation  of  a  seizure  disorder.   The patient apparently had an episode of passing out about 2 weeks ago.   MEDICATIONS:  Estratest, Detrol, Prevacid, Dilantin.   This  is a 21-channel digitally obtained  EEG  tracing  obtained  with  the   surface electrodes placed according  to  the  international  classification   system of 10/20.  This is an awake EEG  tracing  with periods of drowsiness   and light sleep.  There was no well  defined  alpha  rhythm  noted  in  the   posterior head region and the predominant background rhythm in this tracing   was low to medium voltage fast beta  activity.   During  the  drowsy period   there was symmetrical attenuation of the  background rhythms and during the   light sleep stages vortex sharp transient  waves were seen.  The background   rhythms appeared to be symmetrical over  both hemispheres without any focal   slowing  or  any  focal  features. There  were  no  paroxysmal  discharges,   abnormal sharp waves, spikes or spike  and  wave  discharges  noted in this   tracing.    Photic  stimulation  did   not   produce   any   abnormalities.   Hyperventilation was not performed.   IMPRESSION:  This is an awake EEG tracing  with  periods  of drowsiness and   light sleep which is felt to be within  normal  limits. There was excessive   beta activity which is either a normal  variant  or from medication effect.   There  were  no  focal  features or paroxysmal  discharges  noted  in  this   tracing.

## 2006-11-04 NOTE — ED Provider Notes (Signed)
[facility]                      EMERGENCY DEPARTMENT TREATMENT REPORT   NAME:  Susan Mclaughlin, Susan Mclaughlin         PT. LOCATION:      ER  [phone]          DOB:                                                                         AGE:   MR #:      BILLING #:           DOA:  11/04/2006   DOD:              SEX:  F   [id number]   [id number]   cc:   Primary Physician:   CHIEF COMPLAINT: Back pain.   HISTORY OF PRESENT ILLNESS:  [age] female presents with lower   back pain that has been going on for at least 3 years with some occasional   numbness of her feet and pain radiating down the left leg.  The patient   states she has been seen by her family doctor for this and was previously   on OxyContin, but has not been on that in the past year.  She has been   taking Tylenol, but her back has been hurting her and she is here upstairs   with her daughter who is having a baby and she decided to come in.  No new   trauma to the back.  No fever.  No other complaints.   REVIEW OF SYSTEMS:   CONSTITUTIONAL:  No fever.   EAR, NOSE, AND THROAT:  No upper respiratory infection symptoms.   MUSCULOSKELETAL:  Positive back pain.   GENITOURINARY:  States some strong-smelling urine.  No dysuria.   GASTROINTESTINAL: No abdominal pain.   NEUROVASCULAR:  Occasional numbness both feet.   All other systems negative.   PAST MEDICAL HISTORY: Chronic back pain, has had prior back surgery 7 years   ago.   MEDICATIONS:  Prevacid, Effexor, estrogen.  Had been on OxyContin, but that   was a year ago.   ALLERGIES:     Codeine, sulfa, erythromycin.   SOCIAL HISTORY:  States she is here visiting her daughter who is having a   baby.  She will be here until November.  [place] in [place].   FAMILY HISTORY:  Noncontributory.   PHYSICAL EXAMINATION:   VITAL SIGNS:  Blood pressure 129/87, pulse 83, respirations 17, temperature   98.6.   GENERAL APPEARANCE:  The patient appears well developed and well  nourished.   Appearance and behavior are age and situation appropriate.   HEENT:  Eyes:  Conjunctivae clear, lids normal.  Pupils equal, symmetrical,   and normally reactive.   NECK:  No vertebral tenderness.  No paraspinal tenderness.   BACK:  No thoracic tenderness. Some tenderness noted lumbar paraspinal   muscles bilaterally.  No redness, no swelling.   RESPIRATORY:  Clear and equal breath sounds.  No respiratory distress,   tachypnea, or accessory muscle use.   CARDIOVASCULAR:  Heart regular, without murmurs, gallops, rubs, or thrills.  PMI not displaced.   GI:  Abdomen soft, nontender, without complaint of pain to palpation.  No   hepatomegaly or splenomegaly.   EXTREMITIES:  Slight edema noted lower extremities.  No erythema.  Dorsalis   pedis pulse is intact bilaterally.  NEUROLOGICAL: DTRs 2+/4+ bilateral   lower extremities.  Sensation intact bilateral lower extremities.   DIAGNOSTIC TESTING:  Urine dip was obtained, which was negative.   FINAL DIAGNOSIS:  Chronic back pain with acute exacerbation.   DISPOSITION:  The patient was discharged with a prescription for Robaxin   and Lodine.  She was told that she needs a physician in the area since she   will be here for a few months.  Advised that we do not manage chronic back   pain.  The patient was evaluated by myself and  Dr. Frances Furbish who agrees   with the above assessment and plan.   Electronically Signed By:   Stormy Card, M.D. 11/25/2006 17:14   ____________________________   Stormy Card, M.D.   My signature above authenticates this document and my orders, the final   diagnosis(es), discharge prescription(s) and instructions in the [facility] record.   ZGA  D:  11/04/2006  T:  11/04/2006  2:05 P   [id number]   DIANA A HOULE

## 2008-11-27 LAB — CBC WITH AUTOMATED DIFF
ABS. BASOPHILS: 0.1 10*3/uL (ref 0.0–0.1)
ABS. EOSINOPHILS: 0.1 10*3/uL (ref 0.0–0.4)
ABS. LYMPHOCYTES: 1.9 10*3/uL (ref 0.8–3.5)
ABS. MONOCYTES: 0.5 10*3/uL (ref 0–1.0)
ABS. NEUTROPHILS: 4.4 10*3/uL (ref 1.8–8.0)
BASOPHILS: 1 % (ref 0–3)
EOSINOPHILS: 1 % (ref 0–5)
HCT: 35.6 % — ABNORMAL LOW (ref 36.0–46.0)
HGB: 11.9 g/dL — ABNORMAL LOW (ref 12.0–16.0)
LYMPHOCYTES: 27 % (ref 20–51)
MCH: 28.8 PG (ref 25.0–35.0)
MCHC: 33.5 g/dL (ref 31.0–37.0)
MCV: 86 FL (ref 78.0–102.0)
MONOCYTES: 8 % (ref 2–9)
MPV: 7.3 FL — ABNORMAL LOW (ref 7.4–10.4)
NEUTROPHILS: 63 % (ref 42–75)
PLATELET: 351 10*3/uL (ref 130–400)
RBC: 4.14 M/uL (ref 4.10–5.10)
RDW: 14.7 % — ABNORMAL HIGH (ref 11.5–14.5)
WBC: 7 10*3/uL (ref 4.5–13.0)

## 2008-11-27 LAB — METABOLIC PANEL, COMPREHENSIVE
A-G Ratio: 1.6 (ref 0.8–1.7)
ALT (SGPT): 31 U/L (ref 30–65)
AST (SGOT): 16 U/L (ref 15–37)
Albumin: 4.4 g/dL (ref 3.4–5.0)
Alk. phosphatase: 144 U/L — ABNORMAL HIGH (ref 50–136)
Anion gap: 9 mmol/L (ref 5–15)
BUN/Creatinine ratio: 12 (ref 12–20)
BUN: 12 MG/DL (ref 7–18)
Bilirubin, total: 0.3 MG/DL (ref 0.1–0.9)
CO2: 27 MMOL/L (ref 21–32)
Calcium: 9.7 MG/DL (ref 8.4–10.4)
Chloride: 105 MMOL/L (ref 100–108)
Creatinine: 1 MG/DL (ref 0.6–1.3)
GFR est AA: >60 mL/min/{1.73_m2} (ref >60)
GFR est non-AA: >60 mL/min/{1.73_m2} (ref >60)
Globulin: 2.8 g/dL (ref 2.0–4.0)
Glucose: 102 MG/DL — ABNORMAL HIGH (ref 74–99)
Potassium: 4.3 MMOL/L (ref 3.5–5.5)
Protein, total: 7.2 g/dL (ref 6.4–8.2)
Sodium: 141 MMOL/L (ref 136–145)

## 2008-11-27 LAB — LIPID PANEL
CHOL/HDL Ratio: 3 (ref 0–5.0)
Cholesterol, total: 202 MG/DL — ABNORMAL HIGH (ref 0–200)
HDL Cholesterol: 68 MG/DL — ABNORMAL HIGH (ref 40–60)
LDL, calculated: 102 MG/DL — ABNORMAL HIGH (ref 0–100)
LDL/HDL Ratio: 1.5
Triglyceride: 160 MG/DL — ABNORMAL HIGH (ref 0–150)
VLDL, calculated: 32 MG/DL

## 2008-11-27 LAB — TSH 3RD GENERATION: TSH: 3.25 u[IU]/mL (ref 0.51–6.27)

## 2008-11-27 LAB — T4, FREE: T4, Free: 1.1 NG/DL (ref 0.89–1.76)

## 2008-11-28 LAB — VITAMIN D, 25 HYDROXY: Vitamin D 25-Hydroxy: 27 ng/mL — ABNORMAL LOW (ref 30–80)

## 2009-01-02 LAB — LIPID PANEL
CHOL/HDL Ratio: 2.2 (ref 0–5.0)
Cholesterol, total: 158 MG/DL (ref 0–200)
HDL Cholesterol: 72 MG/DL — ABNORMAL HIGH (ref 40–60)
LDL, calculated: 42.6 MG/DL (ref 0–100)
LDL/HDL Ratio: 0.6
Triglyceride: 217 MG/DL — ABNORMAL HIGH (ref 0–150)
VLDL, calculated: 43.4 MG/DL

## 2009-01-02 LAB — METABOLIC PANEL, COMPREHENSIVE
A-G Ratio: 1.7 (ref 0.8–1.7)
ALT (SGPT): 32 U/L (ref 30–65)
AST (SGOT): 16 U/L (ref 15–37)
Albumin: 4.5 g/dL (ref 3.4–5.0)
Alk. phosphatase: 127 U/L (ref 50–136)
Anion gap: 7 mmol/L (ref 5–15)
BUN/Creatinine ratio: 20 (ref 12–20)
BUN: 18 MG/DL (ref 7–18)
Bilirubin, total: 0.3 MG/DL (ref 0.1–0.9)
CO2: 35 MMOL/L — ABNORMAL HIGH (ref 21–32)
Calcium: 10.2 MG/DL (ref 8.4–10.4)
Chloride: 105 MMOL/L (ref 100–108)
Creatinine: 0.9 MG/DL (ref 0.6–1.3)
GFR est AA: >60 mL/min/{1.73_m2} (ref >60)
GFR est non-AA: >60 mL/min/{1.73_m2} (ref >60)
Globulin: 2.7 g/dL (ref 2.0–4.0)
Glucose: 118 MG/DL — ABNORMAL HIGH (ref 74–99)
Potassium: 4.7 MMOL/L (ref 3.5–5.5)
Protein, total: 7.2 g/dL (ref 6.4–8.2)
Sodium: 147 MMOL/L — ABNORMAL HIGH (ref 136–145)

## 2009-01-02 LAB — HEMOGLOBIN A1C WITH EAG: Hemoglobin A1c: 5.9 % (ref 4.8–6.0)

## 2010-05-18 NOTE — Progress Notes (Signed)
Subject here today for a 12 week follow up in a clinical trial for idiopathic overactive bladder and urge incontinence. Vitals obtained. Urine sample obtained and sent to laboratory for resulting. Bladder diary reviewed for completeness. No adverse events or changes in con-meds reported.           For Complete Documentation of this Clinical Trial Visit See The Source Document For The Study Subject As The First Place For Documentation Is The Source Document.       Rob Bunting

## 2010-05-25 MED ORDER — NITROFURANTOIN MACROCRYSTAL 100 MG CAP
100 mg | ORAL_CAPSULE | Freq: Two times a day (BID) | ORAL | Status: AC
Start: 2010-05-25 — End: 2010-05-30

## 2010-05-25 NOTE — Telephone Encounter (Signed)
Subject is in a clinical trial for IOAB and urge incontinence. Urine C&S performed per the study. Subject notified of positive urine culture. RX for Macrobid 100 mg BID x 5 days sent to [facility] . Subject has noticed an increase in incontinence but no other urinary symptoms.        Rob Bunting

## 2010-07-13 NOTE — Progress Notes (Signed)
Subject here today for a unscheduled visit in a clinical trial for IOAB and urge incontinence. Vitals obtained. Urine sample obtained. Urine processed and sent to laboratory for resulting. All adverse events and medication changes are recorded. Subject has next appointment.      BP 110/70   Pulse 66   Temp(Src) 97.6 ??F (36.4 ??C) (Oral)   Resp 12   Ht 5\' 1"  (1.549 m)   Wt 157 lb (71.215 kg)   BMI 29.67 kg/m2        For Complete Documentation of this Clinical Trial Visit See The Source Document For The Study Subject As The First Place For Documentation Is The Source Document.       Rob Bunting

## 2010-07-26 NOTE — Progress Notes (Signed)
Subject here today for qualification for treatment visit in a clinical trial for urge urinary incontinence and IOAB. Subjects bladder diary was reviewed and based on the information recorded, the subject did not qualify for re-treatment per protocol guidelines.        For Complete Documentation of this Clinical Trial Visit See The Source Document For The Study Subject As The First Place For Documentation Is The Source Document.       Rob Bunting

## 2010-08-12 NOTE — Progress Notes (Signed)
Subject here today for qualification for treatment in a clinical trial for IOAB and urge incontinence. Vitals obtained. Labs drawn, processed, and sent to laboratory for resulting. Bladder scan performed. All study questionnaires completed. All adverse events and con-meds changes are recorded on the proper source document form. Subject was scheduled for treatment.      BP 122/80   Pulse 82   Temp(Src) 97.6 ??F (36.4 ??C) (Oral)        For Complete Documentation of this Clinical Trial Visit See The Source Document For The Study Subject As The First Place For Documentation Is The Source Document.         Rob Bunting

## 2010-08-19 MED ORDER — CIPROFLOXACIN 500 MG TAB
500 mg | ORAL_TABLET | Freq: Two times a day (BID) | ORAL | Status: AC
Start: 2010-08-19 — End: 2010-08-26

## 2010-08-19 NOTE — Telephone Encounter (Signed)
Subject is a participant in a clinical trial for IOAB and urge incontinence. Subject's urine culture that is performed as part of the clinical trial came back positive for infection.  Subject informed of positive urine culture results. Prescription was called to the subjects pharmacy.         Rob Bunting

## 2010-08-30 NOTE — Progress Notes (Addendum)
08/30/2010    Chief Complaint   Patient presents with   ??? Clinical Trials     IOAB and incontinence - Botox   ??? Procedure       Susan Mclaughlin is a 55 y.o. WF previously enrolled in a clinical trial for IOAB and urge incontinence. She has recently requested retreatment with study drug and presents today for cysto/injection of botox (100IU).    Pt's last treatment was January of 2011 with improvement in symptoms. She did require CIC for elevated PVRs post-treatment.   Recent urine C&S was positive - pt was asymptomatic but treated as per study protocol. She is now on Cipro 500mg  BID through 3 days s/p treatment.     All paperwork, including consent, completed.      Cysto was performed after lidocaine was instilled into her bladder for the appropriate time. ??  Cysto was performed under sterile conditions ??  Her bladder was completely inspected there were no bladder stones or tumors  The botox was was injected into the bladder following study protocol the syringes were given to me with 10 cc and 20 solution was injected with 20 injections and 0.5 cc per injection ??  The injections were evenly distributed avoiding the trigone ??  We flushed the last needle as described. There was no active bleeding ??  The patient voided with out difficulty prior to leaving ??    1. OAB (overactive bladder)    2. Incontinence        F/u as per study protocol    For Complete Documentation of this Clinical Trial Visit See The Source Document For The Study Subject As The First Place For Documentation Is The Source Document.     Lacey Jensen, PA     Pt seen and examined  Agree with above  Cysto done by me per protocol  Pt tolerated well  Jamey Reas, MD

## 2010-08-30 NOTE — Patient Instructions (Signed)
Have a nice day!

## 2010-08-31 NOTE — Progress Notes (Signed)
Subject presents today for IOAB clinical trial injection visit. Subject states no complaints and no changes in her concomitant medications. Vitals were obtained. Labs collected, processed and sent to laboratory for resulting. Subject stated she took the antibiotic (Cipro 500 mg) as directed per the study protocol. Injection procedure performed per protocol guidelines. Subject voided without difficulty after the injection procedure. Subject stated no complaints. Subject will return in 2 weeks.          For Complete Documentation of this Clinical Trial Visit See The Source Document For The Study Subject As The First Place For Documentation Is The Source Document.       Rob Bunting

## 2010-09-19 NOTE — Progress Notes (Addendum)
Subject here today for PTX visit #4 week 2. Subject states no changes in her health and no changes in con-meds. Vitals were obtained. Labs were drawn, processed and sent to laboratory for resulting. Bladder scan obtained. PVR was 180 mls. Subject states no problems with urination. Bladder diary reviewed for completeness. Subject was given next appointment.      BP 122/77   Pulse 89   Temp 98.1 ??F (36.7 ??C)   Resp 12   Ht 5\' 1"  (1.549 m)   Wt 158 lb (71.668 kg)   BMI 29.85 kg/m2        For Complete Documentation of this Clinical Trial Visit See The Source Document For The Study Subject As The First Place For Documentation Is The Source Document.      Rob Bunting

## 2010-09-27 MED ORDER — TRIMETHOPRIM 100 MG TAB
100 mg | ORAL_TABLET | Freq: Every day | ORAL | Status: DC
Start: 2010-09-27 — End: 2010-11-29

## 2010-09-27 NOTE — Telephone Encounter (Signed)
Subject is participating in a clinical trial for IOAB and urge incontinence. The subject had a positive urine culture.  Phone call to subject to advise subject of positive urine culture results. LMOM regarding results. Per Dr. Nada Boozer prescription for Trimethoprim 100 mg 1 PO daily #90 sent e-scribe to the subjects pharmacy listed above.      Rob Bunting

## 2010-10-11 NOTE — Progress Notes (Signed)
Subject here today for PTX #4 week 6 visit. Vitals obtained.Urine sample obtained. Urine dipstick was negative for leukocyte esterase, nitrites and blood. Labs processed and sent to laboratory for resulting. Bladder scan obtained. PVR was 119 mls. Bladder diary reviewed for completeness. Subject stated no change in her health and no changes in concomitant medications.         BP 138/90   Pulse 90   Temp(Src) 98.3 ??F (36.8 ??C) (Oral)   Ht 5\' 1"  (1.549 m)   Wt 158 lb (71.668 kg)   BMI 29.85 kg/m2        For Complete Documentation of this Clinical Trial Visit See The Source Document For The Study Subject As The First Place For Documentation Is The Source Document.       Rob Bunting, MA, CCRP

## 2010-11-22 NOTE — Progress Notes (Signed)
Subject here today for post treatment 3 week 12 visit. Vitals obtained. Labs drawn, processed and sent to laboratory for resulting. PVR performed. Bladder diary reviewed for completeness. Subject states no changes in her health and no changes in her concomitant medications.      BP 120/80   Pulse 66   Temp(Src) 98.4 ??F (36.9 ??C) (Oral)   Resp 12   Ht 5\' 1"  (1.549 m)   Wt 156 lb (70.761 kg)   BMI 29.48 kg/m2        For Complete Documentation of this Clinical Trial Visit See The Source Document For The Study Subject As The First Place For Documentation Is The Source Document.       Rob Bunting, MA, CCRP

## 2010-11-29 MED ORDER — CIPROFLOXACIN 500 MG TAB
500 mg | ORAL_TABLET | Freq: Two times a day (BID) | ORAL | Status: AC
Start: 2010-11-29 — End: 2010-12-06

## 2010-11-29 MED ORDER — NITROFURANTOIN (25% MACROCRYSTAL FORM) 100 MG CAP
100 mg | ORAL_CAPSULE | Freq: Every day | ORAL | Status: AC
Start: 2010-11-29 — End: 2011-02-27

## 2010-11-29 NOTE — Telephone Encounter (Signed)
Subject is a participant in a clinical trial for IOAB. The urine sample sent per the study protocol guidelines was positive for infection. Subject was notified of the result. Subject was advised to stop taking Trimethoprim and start taking Cipro 500 mg 1 BID # 14 and upon completion of the Cipro she is to start taking Macrobid 100 mg 1 daily . Prescriptions were sent to pharmacy above via e-script. Subject stated understanding.          Rob Bunting, MA, CCRP

## 2011-02-15 NOTE — Progress Notes (Signed)
The subject is here today for a12 week follow up visit follow up visit in a clinical trial for IOAB and and urge incontinence. Vitals were obtained. Urine sample was obtained. Urine sample was dipped and was negative. Urine sample was processed and sent to the laboratory for resulting. The subject states she was diagnosed with a sinus infection on 02/11/11 and given Amoxicillin. A bladder scan was performed. PVR was 250 ml. The subject states no other changes in her health. Medication changes are noted and recorded in the source documents. No other changes noted. The subjects diary was returned and reviewed for completeness. The subject was re-educated on the importance of capturing all of the information that is required in the diary. The subject was given her next appointment.      BP 130/80   Pulse 100   Temp(Src) 98.4 ??F (36.9 ??C) (Oral)   Resp 14   Ht 5\' 1"  (1.549 m)        For Complete Documentation of this Clinical Trial Visit See The Source Document For The Study Subject As The First Place For Documentation Is The Source Document.      Rob Bunting

## 2011-02-15 NOTE — Progress Notes (Signed)
The new version of the informed consent, version 5 has been reviewed/read by the subject. Discussion took place regarding the changes. The subject expressed continued participation. The ICF was signed and dated by the subject and all other parties. A copy of the signed and dated ICF was given to the subject for home files.           Rob Bunting, MA, CCRP

## 2011-03-22 ENCOUNTER — Encounter

## 2011-04-04 ENCOUNTER — Encounter

## 2011-04-04 MED ORDER — GADOPENTETATE DIMEGLUMINE 7.5 MMOL/15 ML IV SYRINGE
7.5 mmol/15 mL (469.01 mg/mL) | Freq: Once | INTRAVENOUS | Status: AC
Start: 2011-04-04 — End: 2011-04-04
  Administered 2011-04-04: 14:00:00 via INTRAVENOUS

## 2011-04-08 LAB — METABOLIC PANEL, BASIC
Anion gap: 5 mmol/L (ref 3.0–18)
BUN/Creatinine ratio: 16 (ref 12–20)
BUN: 12 MG/DL (ref 7.0–18)
CO2: 32 MMOL/L (ref 21–32)
Calcium: 9.2 MG/DL (ref 8.5–10.1)
Chloride: 105 MMOL/L (ref 100–108)
Creatinine: 0.77 MG/DL (ref 0.6–1.3)
GFR est AA: >60 mL/min/{1.73_m2} (ref >60)
GFR est non-AA: >60 mL/min/{1.73_m2} (ref >60)
Glucose: 102 MG/DL — ABNORMAL HIGH (ref 74–99)
Potassium: 4.3 MMOL/L (ref 3.5–5.5)
Sodium: 142 MMOL/L (ref 136–145)

## 2011-04-08 LAB — CBC WITH AUTOMATED DIFF
ABS. BASOPHILS: 0 10*3/uL (ref 0.0–0.06)
ABS. EOSINOPHILS: 0.1 10*3/uL (ref 0.0–0.4)
ABS. LYMPHOCYTES: 2.7 10*3/uL (ref 0.9–3.6)
ABS. MONOCYTES: 0.6 10*3/uL (ref 0.05–1.2)
ABS. NEUTROPHILS: 5.9 10*3/uL (ref 1.8–8.0)
BASOPHILS: 0 % (ref 0–2)
EOSINOPHILS: 1 % (ref 0–5)
HCT: 39 % (ref 35.0–45.0)
HGB: 13.2 g/dL (ref 12.0–16.0)
LYMPHOCYTES: 29 % (ref 21–52)
MCH: 28.1 PG (ref 24.0–34.0)
MCHC: 33.8 g/dL (ref 31.0–37.0)
MCV: 83 FL (ref 74.0–97.0)
MONOCYTES: 7 % (ref 3–10)
MPV: 9.1 FL — ABNORMAL LOW (ref 9.2–11.8)
NEUTROPHILS: 63 % (ref 40–73)
PLATELET: 305 10*3/uL (ref 135–420)
RBC: 4.7 M/uL (ref 4.20–5.30)
RDW: 12.9 % (ref 11.6–14.5)
WBC: 9.3 10*3/uL (ref 4.6–13.2)

## 2011-04-08 MED ORDER — OXYCODONE-ACETAMINOPHEN 5 MG-325 MG TAB
5-325 mg | ORAL_TABLET | ORAL | Status: DC | PRN
Start: 2011-04-08 — End: 2011-07-04

## 2011-04-08 MED ORDER — AMOXICILLIN 875 MG TAB
875 mg | ORAL_TABLET | Freq: Two times a day (BID) | ORAL | Status: AC
Start: 2011-04-08 — End: 2011-04-18

## 2011-04-08 MED ORDER — OXYCODONE-ACETAMINOPHEN 5 MG-325 MG TAB
5-325 mg | ORAL | Status: AC
Start: 2011-04-08 — End: 2011-04-08
  Administered 2011-04-08: 19:00:00 via ORAL

## 2011-04-08 MED ORDER — KETOROLAC TROMETHAMINE 30 MG/ML INJECTION
30 mg/mL (1 mL) | INTRAMUSCULAR | Status: AC
Start: 2011-04-08 — End: 2011-04-08
  Administered 2011-04-08: 17:00:00 via INTRAVENOUS

## 2011-04-08 MED FILL — KETOROLAC TROMETHAMINE 30 MG/ML INJECTION: 30 mg/mL (1 mL) | INTRAMUSCULAR | Qty: 1

## 2011-04-08 MED FILL — OXYCODONE-ACETAMINOPHEN 5 MG-325 MG TAB: 5-325 mg | ORAL | Qty: 1

## 2011-04-08 NOTE — ED Notes (Signed)
Patient armband removed and shredded  I have reviewed discharge instructions with the patient.  The patient verbalized understanding. Patient is ambulatory and leaving with two prescriptions.  Patient is advised not to drive or operate heavy machinery while taking pain medicatiion.  Patient also advised not to take any over the counter tylenol while taking pain medication.  NAD noted and VSS.

## 2011-04-08 NOTE — ED Notes (Signed)
Patient returned from restroom.

## 2011-04-08 NOTE — ED Notes (Signed)
"  I've been sick, had a cough and a headache and today I have tingling down my face and I'm dizzy"

## 2011-04-08 NOTE — ED Notes (Signed)
Patient with obvious nasal congestion.  C/o Tingling sensation down both sides of face, denies diffenernce in perseption of touch to area.  Speech clear, no arm drift.

## 2011-04-08 NOTE — ED Notes (Signed)
Patient states that her pain level is a 10/10 on a numeric scale for her headache.  Patient resting on stretcher with eyes closed easily aroused.  NAD noted and VSS

## 2011-04-08 NOTE — ED Notes (Signed)
Patient provided with an Ice pack to help aleve headache pain.

## 2011-04-08 NOTE — ED Notes (Signed)
Patient assisted to restroom.  Patient denies any needs at this time.  NAD noted and VSS.

## 2011-04-08 NOTE — ED Provider Notes (Signed)
HPI Comments: Pt presents to ED c/o R frontal h/a. Began yesterday at rest. Pt states that she has attempted tylenol w/o relief (she has percocet at home as well).   Pt c/o dizziness  When she gets up she states hseh gets dizzy, although asx in bed. Pt denies any other c/o.  Pt is taking multiple meds, stopped her Savella on friday because it bothered her stomach.    Patient is a 56 y.o. female presenting with headaches, dizziness, and cough. The history is provided by the patient.   Headache  This is a new problem. The current episode started 12 to 24 hours ago. The problem occurs constantly. The problem has not changed since onset.Associated symptoms include headaches. Nothing aggravates the symptoms. Nothing relieves the symptoms. She has tried acetaminophen for the symptoms.   Dizziness   Associated symptoms include headaches and dizziness.   Cough  Associated symptoms include headaches.        Past Medical History   Diagnosis Date   ??? Depression    ??? Degenerative disc disease    ??? Arthritis    ??? Scoliosis    ??? Migraine headache    ??? Hx pulmonary embolism 2009   ??? Sleep apnea    ??? GERD (gastroesophageal reflux disease)    ??? Urge incontinence of urine    ??? Low back pain    ??? ADD (attention deficit disorder)    ??? Headache    ??? Esophageal reflux         Past Surgical History   Procedure Date   ??? Hx back surgery 08/01   ??? Hx total abdominal hysterectomy 1984   ??? Hx cholecystectomy    ??? Hx appendectomy    ??? Hx breast biopsy    ??? Pr drainage of hematoma/fluid 2004   ??? Hx other surgical      NISSEN FUNDOPLICATION   ??? Hx other surgical 08/03/2010     KNEE ARTHROPATHY   ??? Hx breast lumpectomy          Family History   Problem Relation Age of Onset   ??? Heart Disease Brother    ??? High Cholesterol Brother    ??? Diabetes Daughter    ??? Diabetes Father    ??? Heart Surgery Father    ??? Heart defect Mother    ??? High Cholesterol Mother    ??? Other Neg Hx      anethesia reaction        History     Social History   ??? Marital Status:  DIVORCED     Spouse Name: N/A     Number of Children: N/A   ??? Years of Education: N/A     Occupational History   ??? Not on file.     Social History Main Topics   ??? Smoking status: Former Smoker -- 1.0 packs/day for 32 years     Types: Cigarettes     Quit date: 11/01/2007   ??? Smokeless tobacco: Not on file   ??? Alcohol Use: No   ??? Drug Use: No   ??? Sexually Active: Not Currently     Other Topics Concern   ??? Not on file     Social History Narrative   ??? No narrative on file                  ALLERGIES: Codeine; Erythromycin; and Sulfa(sulfonamide antibiotics)      Review of Systems   Constitutional: Negative.  Genitourinary: Negative.    Musculoskeletal: Negative.    Neurological: Positive for dizziness and headaches.   Hematological: Negative.    Psychiatric/Behavioral: Negative.    All other systems reviewed and are negative.        Filed Vitals:    04/08/11 0946 04/08/11 1020   BP: 134/79    Pulse: 90    Temp: 97.3 ??F (36.3 ??C)    Resp: 16    Height: 5\' 1"  (1.549 m)    Weight: 72.576 kg (160 lb)    SpO2: 99% 98%            Physical Exam   Nursing note and vitals reviewed.  Constitutional: She is oriented to person, place, and time. She appears well-developed.   HENT:   Head: Normocephalic.       Right Ear: External ear normal.   Left Ear: External ear normal.   Nose: Nose normal.   Mouth/Throat: Oropharynx is clear and moist.   Eyes: Conjunctivae are normal. Pupils are equal, round, and reactive to light.   Neck: Normal range of motion. No tracheal deviation present. No thyromegaly present.   Cardiovascular: Normal rate, regular rhythm and intact distal pulses.    Pulmonary/Chest: Effort normal.   Abdominal: Soft.   Musculoskeletal: Normal range of motion.   Neurological: She is alert and oriented to person, place, and time.   Skin: Skin is warm and dry.   Psychiatric: She has a normal mood and affect.        MDM    Procedures  11:44 AM  Pt resting in nad  1:15 PM  Resting in nad.

## 2011-04-08 NOTE — ED Notes (Signed)
Dr Francee Piccolo at bedside assessing patient.

## 2011-04-19 NOTE — Telephone Encounter (Signed)
Phoned patient to go over Certificate of Medical Necessity questionnaire (impared mobility YES, impaired endurance YES, restricted activity YES, skin breakdown NO, impared respiration NO assistance with ADL NO, impaired speech NO, nutritional supplements NO, height 5'5  Weight 180 lbs) Faxed to [facility] @ [phone] showing last examined by practitioner on 02/25/11 (Research Encounter)

## 2011-05-02 NOTE — Telephone Encounter (Signed)
05/02/11 Dr Nada Boozer pt called to check status of her Incontinence Supply [employer]. Pt states [facility] faxed request for auth to our office. Pt states she really needs & would like the nurse to check on the order for her. Pt can be reached at [phone]

## 2011-05-03 NOTE — Telephone Encounter (Signed)
Phoned patient to go over incontinence supplies order status//[facility] conference call with [employer] @ [phone] there a representative by the name of Susan Mclaughlin advise order is pending///[employer] requested for form to be faxed to direct # [phone] for completion, once order is received and completed order to be faxed to [phone]//All parties verbalized understanding

## 2011-05-03 NOTE — Progress Notes (Signed)
Certificate of Medical Necessity faxed to [facility] @ [phone]

## 2011-05-04 LAB — AMB POC AVEE DRUG SCREEN
AMPHETAMINES UR POC: POSITIVE
OPIATES UR POC: POSITIVE
OXYCODONE UR POC: POSITIVE

## 2011-05-04 MED ORDER — HYDROCODONE-ACETAMINOPHEN 7.5 MG-325 MG TAB
ORAL_TABLET | Freq: Three times a day (TID) | ORAL | Status: DC | PRN
Start: 2011-05-04 — End: 2011-07-03

## 2011-05-04 MED ORDER — METAXALONE 800 MG TAB
800 mg | ORAL_TABLET | Freq: Three times a day (TID) | ORAL | Status: DC | PRN
Start: 2011-05-04 — End: 2011-07-03

## 2011-05-04 MED ORDER — GABAPENTIN 600 MG TAB
600 mg | ORAL_TABLET | Freq: Three times a day (TID) | ORAL | Status: DC
Start: 2011-05-04 — End: 2011-07-03

## 2011-05-04 NOTE — Progress Notes (Signed)
HISTORY OF PRESENT ILLNESS  Susan Mclaughlin is a 56 y.o. female.  HPI Comments: New patient, referred for back pain by Dr.Adelshaheed.  Initial visit survey reviewed. On the pain diagram, the patient indicates low back pain, neck pain, pain to both knees.  The least amount of pain, 7/10. Greatest and average pain, 8/10.  The patient indicates that medication helps general activity, mood, walking, relations with other people, sleep, enjoyment of life.  I reviewed the list of medications, this includes Wellbutrin, Adderall, gabapentin, 300 mg in the morning, 2 tablets at night.  --chief complaint-low back pain. The back pain for well over 10 years. History of one lumbar spine surgery, fusion, 2001. Significant improvement of most symptoms for about 8 years after the surgery.  Back pain is achy, present during the day and night, almost constant. Intermittent symptoms into the left leg, sometimes intermittent feeling of numbness to the left foot, rarely to the right foot.  Back pain increases with extended sitting or standing or walking. She reports increased back pain when she lies flat on her back. She reports intermittent muscle spasms.        -Prescription monitoring program reviewed.--6 different prescribers, 4 different pharmacies. Many prescriptions for Percocet and Adderall. A few prescriptions for hydrocodone. Most recent prescriptions, Percocet-5 mg, #40, January 16. Percocet, 5 mg, #10, January 28.  The patient was educated on the Tylenol component of certain pain medications. The patient was reminded, they should keep track of total Tylenol intakeand make sure liver function tests have been checked with primary care physician.    -opioid risk tool has been reviewed, and will be scanned. Total score--3  -The available medical record/information has been reviewed including notes from the referring physician's office. Medical information reviewed, includes progress note from the referring physician's office.  Medical information indicates a history of depression, a DD, that the patient is seeing psychiatry. That the patient has seen orthopedic clinic about knee pain in the past. History of irritable bowel syndrome, arthritis. Unfortunately, at this time, I do not have any notes from orthopedic clinic. No radiology reports from the referring physician's office. We will try to track down other information.  However I was able to review some radiology reports in the electronic medical record system.  -Radiologist impression, MRI of the lumbar spine, January 2013-mild degenerative changes of lumbar spine above the level of prior L5-S1 fusion. Findings are most notable at L4-5.  X-ray thoracic spine, November 2012, radiologist impression: Mild scoliosis and degenerative changes. Radiologist impression, x-ray of the lumbar spine, November 2012-stable posterior fusion of L5 and S1. Mild degenerative changes.    -New patient survey reviewed and will be scanned.Please see this form for more information concerning PMH,FH,SH, and ROS.  The majority of today's visit was spent counseling and coordinating care.Total visit time was greater than 60 minutes.        - Preliminary urine screen reviewed.       - Additional time was spent reviewing medical records,interpreting data and educating the patient.                              -Discussing Dxes,condition and treatment options,possible side effects/risks/complications.        Review of Systems   Constitutional: Negative for fever and chills.   HENT: Positive for neck pain. Negative for sore throat.    Eyes: Negative for blurred vision and double vision.   Respiratory: Negative  for cough and shortness of breath.    Cardiovascular: Positive for leg swelling. Negative for chest pain.   Gastrointestinal: Positive for heartburn, nausea and constipation.   Genitourinary: Negative for dysuria and urgency.   Musculoskeletal: Positive for back pain and joint pain.   Skin: Negative for itching  and rash.   Neurological: Positive for headaches. Negative for dizziness and seizures.   Endo/Heme/Allergies: Positive for environmental allergies. Does not bruise/bleed easily.   Psychiatric/Behavioral: Positive for depression and memory loss. The patient is nervous/anxious and has insomnia.        Physical Exam   Nursing note and vitals reviewed.  Constitutional: She appears well-developed and well-nourished. She is cooperative. She does not have a sickly appearance.   HENT:   Head: Normocephalic and atraumatic.   Right Ear: External ear normal. No drainage.   Left Ear: External ear normal. No drainage.   Nose: Nose normal.   Mouth/Throat: No oropharyngeal exudate.   Eyes: Lids are normal. Right eye exhibits no discharge. Left eye exhibits no discharge. Right conjunctiva has no hemorrhage. Left conjunctiva has no hemorrhage. Pupils are equal.   Neck: Neck supple. No tracheal deviation present. No mass present.   Cardiovascular: Normal rate and regular rhythm.    No murmur heard.  Pulmonary/Chest: Effort normal and breath sounds normal. No respiratory distress.   Musculoskeletal:        Lumbar back: She exhibits decreased range of motion and tenderness. She exhibits no spasm.   Neurological: She is alert. She has normal reflexes. No cranial nerve deficit.        Slow, stiff gait without assistive device.  Negative compression test. Negative seated straight leg raise test.  Grossly normal strength, light-touch sensation, deep tendon reflexes of lower extremities, except for the left patellar reflex did seem to be decreased compared to the right.  Tenderness to the right hip and the left sacroiliac region.   Skin: Skin is intact. No abrasion and no rash noted. She is not diaphoretic. No cyanosis or erythema.   Psychiatric: Her speech is normal and behavior is normal. Judgment and thought content normal. Her mood appears anxious. Her affect is not angry. Cognition and memory are normal. She does not express  inappropriate judgment. She does not exhibit a depressed mood.       ASSESSMENT and PLAN  Encounter Diagnoses   Name Primary?   ??? LBP (low back pain) Yes   ??? Encounter for long-term (current) use of other medications    ??? Radiculitis, lumbosacral    ??? Chronic pain syndrome    ??? Knee pain, bilateral    ??? S/P lumbar fusion    ??? OA (osteoarthritis)    ??? DJD (degenerative joint disease) of lumbar spine    ??? History of depression    ??? ADD (attention deficit disorder)    followup in 2 months.I have encouraged her to follow with her psychiatrist, who prescribes the Wellbutrin and Adderall.  -Pain is a complex sensory and emotional experience intimately related to the concept of suffering and the life experiences and psychological makeup of the individual. There is a host of psychosocial issues,including depression,anxiety,fear,altered social roles,and loss of activities which have a strong scientific basis for contributing to the chronic pain state. The effective treatment of chronic pain involves understanding the individual with pain. Issues of fear avoidance,catastrophizing,belief systems about exercise and injury,sleep disturbance,family dynamics,and other factors may play a role in the patient's experience of pain.     -increase the  gabapentin. I have prescribed 600 mg, 3 times a day.  Hydrocodone, 7.5 mg, 3 times a day as needed. She reported today, hydrocodone and oxycodone seem to be equal and effectiveness for her pain.  She reports some benefit from Skelaxin in the past and is not tried other muscle relaxers.  Skelaxin, 800 mg, 3 times a day as needed.  Savella and Lyrica caused nausea, she has not tried Cymbalta.  She has tried tramadol.  She has tried nonsteroidal anti-inflammatory drugs and may continue as directed by her primary care physician.  She has tried physical therapy. She reports several spinal injections. Unfortunately we do not know the specific type of injection. She feels that physical therapy  and injections at this point are unlikely to help, have encouraged her to consider retrying. I have requested a consult with Dr. Cristy Folks to discuss possible injections, recommendations from an injection/procedure standpoint.  Prescription monitoring program indicated 6 different prescribers.  -she denies any EMG in the past, consider an EMG in the future.      1. -Medications are prescribed with the objective of pain relief and improved physical and psychosocial function in this patient.(improve quality of life)  2. The patient has been counseled  on proper use of prescribed medications.  3. The patient has been counseled  about chronic medical conditions and their relationship to anxiety and depression.  4. Reviewed with patient self-help tools, home exercise, and lifestyle changes to assist the patient in self-management of symptoms.  5. Advised patient to have a primary care provider to continue care for health maintenance and general medical conditions and support for referral to specialty care as needed.  6. Reviewed with patient the treatment plan, goals of treatment plan, and limitations of treatment plan, to include the potential for side effects from medications. If side effects occur, it is the responsibility of the patient to inform the clinic so that a change in the treatment plan can be made in a safe manner. The patient is advised that stopping prescribed medication may cause an increase in symptoms and possible medication withdrawal symptoms. The patient is informed an emergency room evaluation may be necessary if this occurs.  DISPOSITION: The patient???s condition and plan were discussed at length and all questions were answered. The patient agrees with the plan.

## 2011-05-10 LAB — AMB POC URINALYSIS DIP STICK AUTO W/O MICRO
Bilirubin (UA POC): NEGATIVE
Blood (UA POC): NEGATIVE
Glucose (UA POC): NEGATIVE
Ketones (UA POC): NEGATIVE
Leukocyte esterase (UA POC): NEGATIVE
Nitrites (UA POC): POSITIVE
Protein (UA POC): NEGATIVE mg/dL
Specific gravity (UA POC): 1.015 (ref 1.001–1.035)
Urobilinogen (UA POC): 0.2 (ref 0.2–1)
pH (UA POC): 7 (ref 4.6–8.0)

## 2011-05-10 LAB — AMB POC PVR, MEAS,POST-VOID RES,US,NON-IMAGING: PVR: 292 cc

## 2011-05-10 NOTE — Progress Notes (Signed)
The subject is here today for 12 week follow up visit in a clinical trial for OAB and urge incontinence. Vitals were obtained BP 130/90   Pulse 60   Temp(Src) 98 ??F (36.7 ??C) (Oral)   Resp 12   Ht 5\' 1"  (1.549 m)   Wt 158 lb (71.668 kg)   BMI 29.85 kg/m2. A urine sample was obtained. A bladder scan was performed which showed a PVR of 292 mls. The subject states she is leaking urine and wearing a depends. She states that she does not have a sense of urinary urgency most of the time she just leaks urine without being aware. Per Dr. Nada Boozer the subject will be scheduled for a urodynamic study. The subject states that there are no other changes in her health. She states she has had changes in her concomitant medications.       For Complete Documentation of this Clinical Trial Visit See The Source Document For The Study Subject As The First Place For Documentation Is The Source Document.

## 2011-05-11 NOTE — Telephone Encounter (Signed)
Certificate of Medical Necessity re-faxed to [employer] with missing information completed [phone]

## 2011-05-16 MED ORDER — CIPROFLOXACIN 250 MG TAB
250 mg | ORAL_TABLET | Freq: Two times a day (BID) | ORAL | Status: AC
Start: 2011-05-16 — End: 2011-05-23

## 2011-05-16 NOTE — Telephone Encounter (Signed)
Telephone call to notify subject of positive urine culture result. The subject is participating in a clinical trial for IOAB and urge incontinence.  Per Dr. Nada Boozer the subject will take Cipro 250 mg bid # 14. The subject was instructed to start taking prophylactic Macrobid after she finishes the Cipro. The subject stated understanding.      Rob Bunting

## 2011-05-31 NOTE — Progress Notes (Addendum)
[facility]  Without Video- Fluoroscopy    Name: Susan Mclaughlin   Date: 05/31/2011    Referred by: Dr. Nada Boozer      1. Urge incontinence  PR ELECTRO-UROFLOWMETRY, FIRST, PR ANAL/URINARY MUSCLE STUDY, PR VOIDING PRESS STUDY INTRA-ABDOMINAL VOID, PR COMPLEX CYSTOMETROGRAM  W/VOID PRESS&URETHRAL PROFILE     Orders Placed This Encounter   ??? ([id number]) - PR ELECTRO-UROFLOWMETRY, FIRST   ??? ([id number]) - PR ANAL/URINARY MUSCLE STUDY   ??? ([id number]) - PR VOIDING PRESS STUDY INTRA-ABDOMINAL VOID   ??? ([id number]) - COMPLEX CYSTOMETROGRAM  W/VOID PRESS&URETHRAL PROFILE       Past Medical History   Diagnosis Date   ??? Depression    ??? Degenerative disc disease    ??? Arthritis    ??? Scoliosis    ??? Migraine headache    ??? Hx pulmonary embolism 2009   ??? Sleep apnea    ??? GERD (gastroesophageal reflux disease)    ??? Urge incontinence of urine    ??? Low back pain    ??? ADD (attention deficit disorder)    ??? Headache    ??? Esophageal reflux    ??? Arthritis    ??? Heartburn       Past Surgical History   Procedure Date   ??? Hx back surgery 08/01   ??? Hx total abdominal hysterectomy 1984   ??? Hx cholecystectomy    ??? Hx appendectomy    ??? Hx breast biopsy    ??? Pr drainage of hematoma/fluid 2004   ??? Hx other surgical      NISSEN FUNDOPLICATION   ??? Hx other surgical 08/03/2010     KNEE ARTHROPATHY   ??? Hx breast lumpectomy        Urine dipstick shows negative for all components.    Voiding Diary: Did not do       Leaking Episodes: yes   Pads per Day: 1-5    Type of Pad: pullups              Uroflometry  Voided Volume 87.7  ml   Maximum Flow Rate 14.9  ml/sec   Average Flow Rate 6.1  ml/sec   Voiding Time 16.7  ml/sec   Flow Pattern intermittent   Post Void Residual Cath 315  ml   Comments:     Cystometrogram  Sensation delayed   First sensation 501 ml   Strong desire 531  ml   Capacity 617  ml   Compliance: normal     Detrusor overactivity:no                  Stress Incontinence  Reduction of prolapse: n/a   Valsalva: no     Cough: Yes:  Pressure > 100  cm     Volume: 200  ml  Type:drops   Stress induced overactivity: Cough: no  Valsalva: no     Micturition  Voided Volume: 119  ml   Detrusor Pressure: At peak flow rate: 11  cm H2O Maximum: 31  cm H2O   Duration Contraction: > 2 min  continuous    Post Void Contraction: no    Valsalva: partial   External Sphincter: unrelaxed   Max Flow Rate: 6.8  ml/sec   Average Flow Rate 2.6  ml/sec   Voiding Time: 1:27  minutes   Flow Pattern: intermittent   Post Void Residual: aspirated 645 ml    Comments: She has a large capacity, compliant bladder with delayed sensation. No  DO.  She demonstrated mild  SUI with cough/drops at 200 ml volume.  She voided with a low-high detrusor contraction, partial valsalva, low intermittent flow with poorly relaxed external sphincter and high PVR. She was instructed to try double voiding by leaning forward. Timed voiding may be of benefit as well as re-evaluation by PT. UDS report as well as tracing from 1/10 scanned into media.     The Patient Instruction Sheet was reviewed with and given to patient who expressesd understanding. understood.    Constance Holster RN    UDS reviewed  Agree with above  Jamey Reas, MD          Follow-up Disposition: Not on File

## 2011-06-02 ENCOUNTER — Encounter

## 2011-06-26 NOTE — Telephone Encounter (Signed)
Metaxalone #90/30 was approved by [facility] until 06/12/2012.

## 2011-07-03 MED ORDER — METAXALONE 800 MG TAB
800 mg | ORAL_TABLET | Freq: Three times a day (TID) | ORAL | Status: DC | PRN
Start: 2011-07-03 — End: 2011-10-03

## 2011-07-03 MED ORDER — HYDROCODONE-ACETAMINOPHEN 7.5 MG-325 MG TAB
ORAL_TABLET | Freq: Three times a day (TID) | ORAL | Status: DC | PRN
Start: 2011-07-03 — End: 2011-10-03

## 2011-07-03 MED ORDER — GABAPENTIN 600 MG TAB
600 mg | ORAL_TABLET | Freq: Three times a day (TID) | ORAL | Status: DC
Start: 2011-07-03 — End: 2011-10-03

## 2011-07-03 NOTE — Progress Notes (Signed)
Did not bring medications. Patient reminded to bring in all medications on every appointment    HISTORY OF PRESENT ILLNESS  Susan Mclaughlin is a 56 y.o. female.  HPI Comments: Meds help with pain control and quality of life.  No new side effects reported today. She does have constipation from the hydrocodone.No new medical problems reported today.Visit survey reviewed, and will be scanned. Recent level of pain relief-- 40  %.   Chief complaint, low back pain, neck pain.  The patient is using hydrocodone, metaxalone,gabapentin      Review of Systems   Constitutional: Negative for fever and chills.   HENT: Positive for neck pain. Negative for sore throat.    Eyes: Negative for blurred vision and double vision.   Respiratory: Negative for cough and shortness of breath.    Cardiovascular: Positive for leg swelling. Negative for chest pain.   Gastrointestinal: Positive for heartburn, nausea and constipation.   Genitourinary: Negative for dysuria and urgency.   Musculoskeletal: Positive for back pain and joint pain.   Skin: Negative for itching and rash.   Neurological: Positive for headaches. Negative for dizziness and seizures.   Endo/Heme/Allergies: Positive for environmental allergies. Does not bruise/bleed easily.   Psychiatric/Behavioral: Positive for depression and memory loss. The patient is nervous/anxious and has insomnia.        Physical Exam   Nursing note and vitals reviewed.  Constitutional: She appears well-developed and well-nourished. She is cooperative. She does not have a sickly appearance.   HENT:   Head: Normocephalic and atraumatic.   Right Ear: External ear normal. No drainage.   Left Ear: External ear normal. No drainage.   Nose: Nose normal.   Eyes: Lids are normal. Right eye exhibits no discharge. Left eye exhibits no discharge. Right conjunctiva has no hemorrhage. Left conjunctiva has no hemorrhage.   Neck: Neck supple. No tracheal deviation present. No mass present.   Pulmonary/Chest: Effort  normal. No respiratory distress.   Neurological: She is alert. No cranial nerve deficit.        Gait with cane   Skin: Skin is intact. No rash noted. She is not diaphoretic. No erythema.   Psychiatric: Her speech is normal and behavior is normal. Judgment and thought content normal. Her mood appears not anxious. Her affect is not angry. Cognition and memory are normal. She does not express inappropriate judgment. She does not exhibit a depressed mood.       ASSESSMENT and PLAN  Encounter Diagnoses   Name Primary?   ??? LBP (low back pain) Yes   ??? Radiculitis, lumbosacral    ??? Encounter for long-term (current) use of other medications    ??? Chronic pain syndrome    ??? Knee pain, bilateral    ??? S/P lumbar fusion    ??? OA (osteoarthritis)    ??? DJD (degenerative joint disease) of lumbar spine    ??? History of depression    ??? ADD (attention deficit disorder)    ??? UTI (lower urinary tract infection)    ??? Back pain    ??? Hypotension    ??? Nausea    ??? Difficulty walking    No signs of addiction or diversion.The primary Dxes.,including pain--and the associated Dxes/Comorbitities are controlled.  Pain/Pain control/Meds and Quality Of Life have been reviewed.  Possible changes to treatment plan considered/reviewed.Support/education given.  Today-no significant change to meds.  Hydrocodone, 7.5 mg, 3 times a day as needed.  Metaxalone, 800 mg, 3 times a day as  needed  Gabapentin, 600 mg 3 times a day  Adderall is from her psychiatrist  Follow up -- 2 months.

## 2011-07-04 NOTE — Progress Notes (Signed)
07/04/2011  Chief Complaint: review UDS done 05/31/2011    HPI: [name] is a 56 year old CF here today to review UDS done 05/31/2011. She has been on an OAB/Botox research study. UDS recommended because of confusing pattern of incontinence.     Stress incontinence: sometimes  Urge incontinence: sometimes, vague. Sometimes leaks without warning  Pads per day: Depends, 5-6  Nocturia x 0 HS 9-10PM, rises 6:15AM  Daytime frequency: x 2-3 per day  Difficulty starting stream: often  Enuresis: pads may be a little wet  Bowel pattern: "IBS constipation"    Urodynamics done 05/31/2011  Micturition  Voided Volume: 119  ml   Detrusor Pressure: At peak flow rate: 11  cm H2O Maximum: 31  cm H2O   Duration Contraction: > 2 min  continuous    Post Void Contraction: no    Valsalva: partial   External Sphincter: unrelaxed   Max Flow Rate: 6.8  ml/sec   Average Flow Rate 2.6  ml/sec   Voiding Time: 1:27  minutes   Flow Pattern: intermittent   Post Void Residual: aspirated 645 ml    Comments: She has a large capacity, compliant bladder with delayed sensation. No DO.  She demonstrated mild  SUI with cough/drops at 200 ml volume.  She voided with a low-high detrusor contraction, partial valsalva, low intermittent flow with poorly relaxed external sphincter and high PVR. She was instructed to try double voiding by leaning forward. Timed voiding may be of benefit as well as re-evaluation by PT. UDS report as well as tracing from 1/10 scanned into media.     G2 P2 vaginal birth x1. C-section x 1  Previous gyn surgery: hysterectomy, BSO  Previous urologic problems or surgery:     Caffeine: occasional  Soda: no  Water: vague, about 4 glasses per day  Alcohol: no  Smoking: occasional  Social: divorced    Occupation: SSI disabled b/c back pain  ROS  Pertinent ROS noted in HPI. Medication list and PMH/PSH reviewed.   Today denies chest pain, sh  ortness of breath, cough. No fever, chills, nausea or vomiting.       Past Medical History    Diagnosis Date   ??? Depression    ??? Degenerative disc disease    ??? Arthritis    ??? Scoliosis    ??? Migraine headache    ??? Hx pulmonary embolism 2009   ??? Sleep apnea    ??? GERD (gastroesophageal reflux disease)    ??? Urge incontinence of urine    ??? Low back pain    ??? ADD (attention deficit disorder)    ??? Headache    ??? Esophageal reflux    ??? Arthritis    ??? Heartburn      Past Surgical History   Procedure Date   ??? Hx back surgery 08/01   ??? Hx total abdominal hysterectomy 1984   ??? Hx cholecystectomy    ??? Hx appendectomy    ??? Hx breast biopsy    ??? Pr drainage of hematoma/fluid 2004   ??? Hx other surgical      NISSEN FUNDOPLICATION   ??? Hx other surgical 08/03/2010     KNEE ARTHROPATHY   ??? Hx breast lumpectomy      Current Outpatient Prescriptions   Medication Sig Dispense Refill   ??? simvastatin (ZOCOR) 20 mg tablet Take  by mouth nightly.       ??? conjugated estrogens (PREMARIN) 0.3 mg tablet Take  by  mouth.       ??? linaclotide (LINZESS) 145 mcg cap capsule Take  by mouth Daily (before breakfast).       ??? gabapentin (NEURONTIN) 600 mg tablet Take 1 Tab by mouth three (3) times daily for 30 days.  90 Tab  3   ??? HYDROcodone-acetaminophen (NORCO) 7.5-325 mg per tablet Take 1 Tab by mouth three (3) times daily as needed for Pain for 30 days.  90 Tab  1   ??? metaxalone (SKELAXIN) 800 mg tablet Take 1 Tab by mouth three (3) times daily as needed for Pain for 30 days.  90 Tab  3   ??? loratadine (CLARITIN) 10 mg tablet Take 10 mg by mouth daily.       ??? Dexlansoprazole (DEXILANT) 60 mg CpDM Take 60 mg by mouth daily.         ??? ondansetron hcl (ZOFRAN) 8 mg tablet Take 8 mg by mouth every eight (8) hours as needed.         ??? amphetamine-dextroamphetamine (ADDERALL) 10 mg tablet Take 10 mg by mouth.         ??? aspirin (ASPIRIN) 325 mg tablet Take 325 mg by mouth daily.         ??? buPROPion SR (WELLBUTRIN SR) 100 mg SR tablet Take 100 mg by mouth two (2) times a day.    Indications: DEPRESSION         Allergies   Allergen Reactions   ???  Codeine Unknown (comments)   ??? Erythromycin Unknown (comments)   ??? Sulfa (Sulfonamide Antibiotics) Unknown (comments)     Physical Exam    BP 120/72   Ht 5\' 1"  (1.549 m)   Wt 177 lb (80.287 kg)   BMI 33.44 kg/m2    General: overweight CF in NAD. Affect appropriate.   Neuro: alert, oriented x3, memory intact. No obvious sensory-motor deficit.   HEENT: neck supple, symmetric.  RESP: breathing unlabored, no audible wheeze or cough  CV: no peripheral edema. Few varicosities.   ABD: soft, rounded, non tender. No CVAT. Scars:   MS: no obvious deformity, FROM, stable gait. Ambulatory aids: no  SKIN: thin, dry   GU: deferred    IMPRESSION  1. Urinary incontinence: probable overflow with small component of SUI  2. Voiding dysfunction: Valsalva voiding and loss of ability to void volitionally  3. Elevated PVR    Encounter Diagnoses   Name Primary?   ??? Dysfunctional voiding of urine Yes   ??? Incomplete bladder emptying    ??? Stress incontinence      PLAN:  1. Self catheterization 4-5 times daily recommended. Patient has done in the past and states no difficulty doing.  2. No medication or surgery indicated.   Jamey Reas, MD
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
## 2011-08-02 NOTE — Progress Notes (Signed)
The new version of the informed consent, version 6 dated 05/12/11 has been reviewed/read by the subject. Discussion took place regarding the changes. The subject expressed continued participation. The ICF was signed and dated by the subject and all other parties. A copy of the signed and dated ICF was given to the subject for home files.       Rob Bunting, MA, CCRP

## 2011-08-02 NOTE — Patient Instructions (Signed)
The subject will follow up with Dr. Nada Boozer on 08/15/11

## 2011-08-02 NOTE — Progress Notes (Addendum)
The subject is here today for a follow up visit in a clinical trial for OAB and urge incontinence. Vitals were obtained BP 122/82   Pulse 88   Temp(Src) 98 ??F (36.7 ??C) (Oral)   Resp 14 A urine sample was obtained. Urine sample was processed and sent to the laboratory for resulting. A bladder scan was performed. The PVR was 92 ml. The subject stated that she felt as if she had a UTI. She states she just feels tired and her urine has a foul odor. The symptoms started approximately 07/26/11. A urine sample was also sent to the local lab  to obtain a faster result. The subject stated she is not having any difficulty performing CIC. She still notes some incontinence. She did state she was cathing 3 times a day and I told her DR. MCCammon recommended 4-5 times a day. Concomitant medications were reviewed with the subject and any changes noted on the source document and CRF. The subject stated no new adverse events. No other changes noted.      For Complete Documentation of this Clinical Trial Visit See The Source Document For The Study Subject As The First Place For Documentation Is The Source Document.       Rob Bunting, MA, CCRP

## 2011-08-04 LAB — CULTURE, URINE

## 2011-08-04 MED ORDER — CIPROFLOXACIN 500 MG TAB
500 mg | ORAL_TABLET | Freq: Two times a day (BID) | ORAL | Status: DC
Start: 2011-08-04 — End: 2011-10-03

## 2011-08-04 MED ORDER — TRIMETHOPRIM 100 MG TAB
100 mg | ORAL_TABLET | Freq: Two times a day (BID) | ORAL | Status: AC
Start: 2011-08-04 — End: 2011-08-11

## 2011-08-04 MED ORDER — TRIMETHOPRIM 100 MG TAB
100 mg | ORAL_TABLET | Freq: Two times a day (BID) | ORAL | Status: DC
Start: 2011-08-04 — End: 2011-08-04

## 2011-08-04 NOTE — Telephone Encounter (Signed)
Phoned patient to advise positive C&S per Dr. Fransisco Beau. Trimpex Rx e-scribed to pharmacy in chart.  Patient verbalized understanding

## 2011-08-04 NOTE — Telephone Encounter (Signed)
The subject was prescribed trimethoprim but the culture is resistant to trimethoprim. Per Dr. Nada Boozer prescription was changed to Cipro and sent to the pharmacy. The subject was notified of the change and stated understanding.      Rob Bunting, MA, CCRP

## 2011-08-04 NOTE — Telephone Encounter (Signed)
Message copied by Etheleen Sia on Fri Aug 04, 2011  4:35 PM  ------       Message from: Lara Mulch A       Created: Fri Aug 04, 2011 12:43 PM         tmp 100 mg po bid # 14       Thanks

## 2011-08-04 NOTE — Progress Notes (Signed)
Quick Note:    tmp 100 mg po bid # 14  Thanks    ______

## 2011-08-04 NOTE — Telephone Encounter (Signed)
Subject notified of positive urine culture. I informed the subject that a prescription for Trimethoprim had been sent to the [facility] pharmacy per her instructions. The subject was advised to stop taking macrobid while she was taking the trimethoprim and to start taking the macrobid again as directed when she finished the trimethoprim. The subject stated understanding.      Rob Bunting, MA, CCRP

## 2011-08-10 ENCOUNTER — Inpatient Hospital Stay: Payer: MEDICARE

## 2011-08-10 MED ORDER — DIAZEPAM 5 MG TAB
5 mg | Freq: Once | ORAL | Status: AC
Start: 2011-08-10 — End: 2011-08-10
  Administered 2011-08-10: 14:00:00 via ORAL

## 2011-08-10 MED ADMIN — lidocaine (pf) (XYLOCAINE) 10 mg/mL (1 %) injection: @ 14:00:00 | NDC 54569319800

## 2011-08-10 MED ADMIN — lidocaine (PF) (XYLOCAINE) 20 mg/mL (2 %) injection Soln: @ 14:00:00 | NDC 00467819210

## 2011-08-10 MED FILL — LIDOCAINE (PF) 20 MG/ML (2 %) IJ SOLN: 20 mg/mL (2 %) | INTRAMUSCULAR | Qty: 15

## 2011-08-10 MED FILL — DIAZEPAM 5 MG TAB: 5 mg | ORAL | Qty: 2

## 2011-08-10 NOTE — Procedures (Signed)
[facility]    PROCEDURE REPORT      PATIENT:  Susan Mclaughlin  DATE OF BIRTH:  05/05/1955  DATE OF SERVICE:  08/10/2011  [facility]    PRE-PROCEDURE DIAGNOSIS:  See Above    POST-PROCEDURE DIAGNOSIS:  See Above                PROCEDURE:  1. Bilateral diagnostic lumbar facet injections (via the L2, L3, L4 medial branch nerve blocks) ([id number], [id number])  2. Fluoroscopic needle guidance ([id number])    LEVELS TREATED:  Bilateral L3-4, L4-5    ANESTHESIA:  See Medication Administration Record    COMPLICATIONS: None.    PHYSICIAN:  August Luzonald J Beauden Tremont, MD    PRE-PROCEDURE NOTE:  Pre-procedural assessment of the patient was performed including a limited history and physical examination. The details of the procedure were discussed with the patient, including the risks, benefits and alternative options and an informed consent was obtained. The patient???s NPO status, if necessary for the specific procedure and/or administration of moderate intravenous sedation, if utilized, and availability of a responsible adult to escort the patient following the procedure were confirmed.    PROCEDURE NOTE:  The patient was brought to the procedure suite and positioned on the fluoroscopy table in the prone position. Physiologic monitors were applied and supplemental oxygen was administered via nasal cannula. The skin was prepped in the standard surgical fashion and sterile drapes were applied over the procedure site. Please refer to the Flowsheet for documentation of the patient???s vital signs and the Medication Administration Record for any oral and/or intravenous sedation administered prior to or during the procedure.    1% Lidocaine was utilized for local anesthesia. Under AP fluoroscopic guidance a 22-gauge, 3-1/2 inch short bevel spinal needle was advanced to the junction of the superior articular process and transverse process of each  vertebral level immediately inferior to the above-mentioned dorsal rami medial branch nerves. A needle was also placed along the sacral ala to block the L5 medial branch nerve. After all needles were placed, 1 mL of 2% lidocaine was injected at each location after the negative aspiration of blood, air or CSF. The needles were removed and the stilets were replaced. The procedure was performed on the contralateral side in the same fashion and at the same levels using the same volume of local anesthetic following negative aspiration of blood, air or CSF. The needles were removed intact.  The area was thoroughly cleaned and sterile bandages applied as necessary. The patient tolerated the procedure well and vital signs remained stable throughout the procedure.    The patient was assessed immediately following the procedure and was noted to have greater than 50% reduction in pain (a reduction from 10/10 to 4/10 in severity of lumbar pain). Based on these results, this procedure will be repeated to assess if the patient is an appropriate candidate for radiofrequency ablation of the above-mentioned medial branch nerves.     POST-PROCEDURE COURSE:  The patient was escorted from the procedure suite in satisfactory condition and recovered per facility protocol based on the type of procedure performed and/or the sedation utilized. The patient did not experience any adverse events and remained hemodynamically stable during the post-procedure period.      DISCHARGE NOTE:  Upon discharge, the patient was able to tolerate fluids and was in no acute distress.  The patient was oriented to person, place and time  and vital signs were stable. Appropriate post-procedure instructions were provided and explained to the patient in detail and all questions were answered.    August Luz, MD 08/10/2011 10:18 AM

## 2011-08-10 NOTE — Procedures (Signed)
[facility] [place]    PROCEDURE REPORT      PATIENT:  Susan Mclaughlin  DATE OF BIRTH:  12/15/1955  DATE OF SERVICE:  08/10/2011  SITE:  [facility]    PRE-PROCEDURE DIAGNOSIS:  See Above    POST-PROCEDURE DIAGNOSIS:  See Above                PROCEDURE:  1. Bilateral diagnostic lumbar facet injections (via the L2, L3, L4 medial branch nerve blocks) (64493, 64494)  2. Fluoroscopic needle guidance ([id number])    LEVELS TREATED:  Bilateral L3-4, L4-5    ANESTHESIA:  See Medication Administration Record    COMPLICATIONS: None.    PHYSICIAN:  Clyda Smyth J Emmanuella Mirante, MD    PRE-PROCEDURE NOTE:  Pre-procedural assessment of the patient was performed including a limited history and physical examination. The details of the procedure were discussed with the patient, including the risks, benefits and alternative options and an informed consent was obtained. The patient???s NPO status, if necessary for the specific procedure and/or administration of moderate intravenous sedation, if utilized, and availability of a responsible adult to escort the patient following the procedure were confirmed.    PROCEDURE NOTE:  The patient was brought to the procedure suite and positioned on the fluoroscopy table in the prone position. Physiologic monitors were applied and supplemental oxygen was administered via nasal cannula. The skin was prepped in the standard surgical fashion and sterile drapes were applied over the procedure site. Please refer to the Flowsheet for documentation of the patient???s vital signs and the Medication Administration Record for any oral and/or intravenous sedation administered prior to or during the procedure.    1% Lidocaine was utilized for local anesthesia. Under AP fluoroscopic guidance a 22-gauge, 3-1/2 inch short bevel spinal needle was advanced to the junction of the superior articular process and transverse process of each  vertebral level immediately inferior to the above-mentioned dorsal rami medial branch nerves. A needle was also placed along the sacral ala to block the L5 medial branch nerve. After all needles were placed, 1 mL of 2% lidocaine was injected at each location after the negative aspiration of blood, air or CSF. The needles were removed and the stilets were replaced. The procedure was performed on the contralateral side in the same fashion and at the same levels using the same volume of local anesthetic following negative aspiration of blood, air or CSF. The needles were removed intact.  The area was thoroughly cleaned and sterile bandages applied as necessary. The patient tolerated the procedure well and vital signs remained stable throughout the procedure.    The patient was assessed immediately following the procedure and was noted to have greater than 50% reduction in pain (a reduction from 10/10 to 4/10 in severity of lumbar pain). Based on these results, this procedure will be repeated to assess if the patient is an appropriate candidate for radiofrequency ablation of the above-mentioned medial branch nerves.     POST-PROCEDURE COURSE:  The patient was escorted from the procedure suite in satisfactory condition and recovered per facility protocol based on the type of procedure performed and/or the sedation utilized. The patient did not experience any adverse events and remained hemodynamically stable during the post-procedure period.      DISCHARGE NOTE:  Upon discharge, the patient was able to tolerate fluids and was in no acute distress.  The patient was oriented to person, place and time   and vital signs were stable. Appropriate post-procedure instructions were provided and explained to the patient in detail and all questions were answered.    Daliana Leverett J Marqui Formby, MD 08/10/2011 10:18 AM

## 2011-08-10 NOTE — Interval H&P Note (Signed)
Date of Surgery Update:  Susan Mclaughlin was seen and examined.  History and physical has been reviewed. There have been no significant clinical changes since the completion of the originally dated History and Physical.    Signed By: August Luz, MD     Aug 10, 2011 10:09 AM

## 2011-08-10 NOTE — Addendum Note (Signed)
Addended by: Eddie Candle on: 08/10/2011 04:11 PM     Modules accepted: Medications

## 2011-08-11 NOTE — Telephone Encounter (Signed)
Pt confirmed upcoming appointment with Dr. McCammon.

## 2011-08-15 LAB — AMB POC URINALYSIS DIP STICK AUTO W/O MICRO
Bilirubin (UA POC): NEGATIVE
Blood (UA POC): NEGATIVE
Glucose (UA POC): NEGATIVE
Ketones (UA POC): NEGATIVE
Leukocyte esterase (UA POC): NEGATIVE
Nitrites (UA POC): POSITIVE
Protein (UA POC): NEGATIVE mg/dL
Specific gravity (UA POC): 1.015 (ref 1.001–1.035)
Urobilinogen (UA POC): 0.2 (ref 0.2–1)
pH (UA POC): 6 (ref 4.6–8.0)

## 2011-08-17 MED ORDER — NITROFURANTOIN (25% MACROCRYSTAL FORM) 100 MG CAP
100 mg | ORAL_CAPSULE | Freq: Every day | ORAL | Status: DC
Start: 2011-08-17 — End: 2011-10-03

## 2011-08-17 NOTE — Telephone Encounter (Signed)
Refilled Macrobid prescription to [facility] pharmacy via e-script. The subject will finish taking the Cipro that was prescribed before she starts taking the Macrobid.      Rob Bunting

## 2011-08-21 NOTE — Progress Notes (Signed)
HPI: [name] is a 56 year old CF here today to review UDS done 05/31/2011. She has been on an OAB/Botox research study. UDS recommended because of confusing pattern of incontinence.   She was shown to have poor emptying and to start CIC  She is tolerating CIC without problems    Stress incontinence: sometimes when she hasnt cathed as frequent  No other significant change  One PPD  Nocturia x 0 HS 9-10PM, rises 6:15AM     Difficulty starting stream: often   Enuresis: pads may be a little wet   Bowel pattern: "IBS constipation"   Urodynamics done 05/31/2011   Micturition   Voided Volume:  119 ml      Detrusor Pressure:  At peak flow rate: 11 cm H2O  Maximum: 31 cm H2O    Duration Contraction:  > 2 min continuous     Post Void Contraction:  no     Valsalva:  partial    External Sphincter:  unrelaxed    Max Flow Rate:  6.8 ml/sec    Average Flow Rate  2.6 ml/sec    Voiding Time:  1:27 minutes    Flow Pattern:  intermittent    Post Void Residual:  aspirated 645 ml    Comments: She has a large capacity, compliant bladder with delayed sensation. No DO. She demonstrated mild SUI with cough/drops at 200 ml volume. She voided with a low-high detrusor contraction, partial valsalva, low intermittent flow with poorly relaxed external sphincter and high PVR. She was instructed to try double voiding by leaning forward. Timed voiding may be of benefit as well as re-evaluation by PT. UDS report as well as tracing from 1/10 scanned into media.      Current Outpatient Prescriptions   Medication Sig Dispense Refill   ??? metaxalone (SKELAXIN) 800 mg tablet Take  by mouth.       ??? gabapentin (NEURONTIN) 600 mg tablet Take  by mouth three (3) times daily.       ??? HYDROcodone-acetaminophen (NORCO) 5-325 mg per tablet Take  by mouth.       ??? ibuprofen (MOTRIN) 600 mg tablet Take  by mouth every six (6) hours as needed.       ??? ciprofloxacin (CIPRO) 500 mg tablet Take 1 Tab by mouth two (2) times a day.  14 Tab  0   ??? simvastatin  (ZOCOR) 20 mg tablet Take  by mouth nightly.       ??? conjugated estrogens (PREMARIN) 0.3 mg tablet Take  by mouth.       ??? ARIPiprazole (ABILIFY) 2 mg tablet Take 10 mg by mouth daily.       ??? linaclotide (LINZESS) 145 mcg cap capsule Take  by mouth Daily (before breakfast).       ??? loratadine (CLARITIN) 10 mg tablet Take 10 mg by mouth daily.       ??? Dexlansoprazole (DEXILANT) 60 mg CpDM Take 60 mg by mouth daily.         ??? ondansetron hcl (ZOFRAN) 8 mg tablet Take 8 mg by mouth every eight (8) hours as needed.         ??? amphetamine-dextroamphetamine (ADDERALL) 10 mg tablet Take 10 mg by mouth.         ??? aspirin (ASPIRIN) 325 mg tablet Take 325 mg by mouth daily.         ??? buPROPion SR (WELLBUTRIN SR) 100 mg SR tablet Take 150 mg by mouth two (  2) times a day. Indications: DEPRESSION       ??? nitrofurantoin, macrocrystal-monohydrate, (MACROBID) 100 mg capsule Take 1 Cap by mouth daily.  90 Cap  1     Past Medical History   Diagnosis Date   ??? Depression    ??? Degenerative disc disease    ??? Arthritis    ??? Scoliosis    ??? Migraine headache    ??? Hx pulmonary embolism 2009   ??? Sleep apnea    ??? GERD (gastroesophageal reflux disease)    ??? Urge incontinence of urine    ??? Low back pain    ??? ADD (attention deficit disorder)    ??? Headache    ??? Esophageal reflux    ??? Arthritis    ??? Heartburn      Past Surgical History   Procedure Date   ??? Hx back surgery 08/01   ??? Hx total abdominal hysterectomy 1984   ??? Hx cholecystectomy    ??? Hx appendectomy    ??? Hx breast biopsy    ??? Pr drainage of hematoma/fluid 2004   ??? Hx other surgical      NISSEN FUNDOPLICATION   ??? Hx other surgical 08/03/2010     KNEE ARTHROPATHY   ??? Hx breast lumpectomy      Allergies   Allergen Reactions   ??? Codeine Unknown (comments)   ??? Erythromycin Unknown (comments)   ??? Sulfa (Sulfonamide Antibiotics) Unknown (comments)     History   Substance Use Topics   ??? Smoking status: Former Smoker -- 1.0 packs/day for 32 years     Types: Cigarettes     Quit date: 11/01/2007    ??? Smokeless tobacco: Not on file   ??? Alcohol Use: No     Family History   Problem Relation Age of Onset   ??? Heart Disease Brother    ??? High Cholesterol Brother    ??? Diabetes Daughter    ??? Diabetes Father    ??? Heart Surgery Father    ??? Heart defect Mother    ??? High Cholesterol Mother    ??? Other Neg Hx      anethesia reaction     Filed Vitals:    08/15/11 0945   Height: 5\' 1"  (1.549 m)   Weight: 165 lb (74.844 kg)     WDWN female  NAD          Results for orders placed in visit on 08/15/11   AMB POC URINALYSIS DIP STICK AUTO W/O MICRO       Component Value Range    Color Yellow  (none)    Clarity Clear  (none)    Glucose Negative  (none)    Bilirubin Negative  (none)    Ketones Negative  (none)    Spec.Grav. 1.015  1.001 - 1.035    Blood Negative  (none)    pH 6.0  4.6 - 8.0    Protein, Urine Negative  Negative mg/dL    Urobilinogen 0.2 mg/dL  0.2 - 1    Nitrites Positive  (none)    Leukocyte esterase Negative  (none)             IMPRESSION   1. Urinary incontinence: probable overflow with small component of SUI   2. Voiding dysfunction: Valsalva voiding and loss of ability to void volitionally   3. Elevated PVR  PLAN:   1. Self catheterization 4-5 times daily recommended. Patient has done in the past and states no difficulty doing.   2. No medication or surgery indicated.   Pt doing well with CIC  Cont CIC   F/U 6 months sooner if needed      Jamey Reas, MD

## 2011-08-22 ENCOUNTER — Inpatient Hospital Stay: Payer: MEDICARE

## 2011-08-22 MED ADMIN — lidocaine (pf) (XYLOCAINE) 10 mg/mL (1 %) injection: @ 14:00:00 | NDC 54569319800

## 2011-08-22 MED ADMIN — diazepam (VALIUM) tablet 10 mg: ORAL | @ 13:00:00 | NDC 68084035911

## 2011-08-22 MED ADMIN — lidocaine (PF) (XYLOCAINE) 20 mg/mL (2 %) injection Soln: @ 14:00:00 | NDC 00467819210

## 2011-08-22 MED FILL — BD POSIFLUSH NORMAL SALINE 0.9 % INJECTION SYRINGE: INTRAMUSCULAR | Qty: 10

## 2011-08-22 MED FILL — DIAZEPAM 5 MG TAB: 5 mg | ORAL | Qty: 2

## 2011-08-22 MED FILL — LIDOCAINE (PF) 20 MG/ML (2 %) IJ SOLN: 20 mg/mL (2 %) | INTRAMUSCULAR | Qty: 15

## 2011-08-22 NOTE — Procedures (Signed)
[facility]    PROCEDURE REPORT      PATIENT:  Susan Mclaughlin  DATE OF BIRTH:  [birth year]  DATE OF SERVICE:  08/22/2011  [facility]    PRE-PROCEDURE DIAGNOSIS:  See Above    POST-PROCEDURE DIAGNOSIS:  See Above                PROCEDURE:  1. Bilateral diagnostic lumbar facet injections (via the L2, L3, L4 medial branch nerve blocks) ([id number], [id number])  2. Fluoroscopic needle guidance ([id number])    LEVELS TREATED:  Bilateral L3-4, L4-5    ANESTHESIA:  See Medication Administration Record    COMPLICATIONS: None.    PHYSICIAN:  August Luzonald J Tonie Elsey, MD    PRE-PROCEDURE NOTE:  Pre-procedural assessment of the patient was performed including a limited history and physical examination. The details of the procedure were discussed with the patient, including the risks, benefits and alternative options and an informed consent was obtained. The patient???s NPO status, if necessary for the specific procedure and/or administration of moderate intravenous sedation, if utilized, and availability of a responsible adult to escort the patient following the procedure were confirmed.    PROCEDURE NOTE:  The patient was brought to the procedure suite and positioned on the fluoroscopy table in the prone position. Physiologic monitors were applied and supplemental oxygen was administered via nasal cannula. The skin was prepped in the standard surgical fashion and sterile drapes were applied over the procedure site. Please refer to the Flowsheet for documentation of the patient???s vital signs and the Medication Administration Record for any oral and/or intravenous sedation administered prior to or during the procedure.    1% Lidocaine was utilized for local anesthesia. Under AP fluoroscopic guidance a 22-gauge, 3-1/2 inch short bevel spinal needle was advanced to the junction of the superior articular process and transverse process of each  vertebral level immediately inferior to the above-mentioned dorsal rami medial branch nerves. A needle was also placed along the sacral ala to block the L5 medial branch nerve. After all needles were placed, 1 mL of 2% lidocaine was injected at each location after the negative aspiration of blood, air or CSF. The needles were removed and the stilets were replaced. The procedure was performed on the contralateral side in the same fashion and at the same levels using the same volume of local anesthetic following negative aspiration of blood, air or CSF. The needles were removed intact.  The area was thoroughly cleaned and sterile bandages applied as necessary. The patient tolerated the procedure well and vital signs remained stable throughout the procedure.    The patient was assessed immediately following the procedure and was noted to have greater than 50% reduction in pain (a reduction from 8/10 to 3/10 in severity of lumbar pain). Based on these results, the patient was considered an appropriate candidate for radiofrequency ablation of the above-mentioned medial branch nerves and will be scheduled for that procedure.     POST-PROCEDURE COURSE:  The patient was escorted from the procedure suite in satisfactory condition and recovered per facility protocol based on the type of procedure performed and/or the sedation utilized. The patient did not experience any adverse events and remained hemodynamically stable during the post-procedure period.      DISCHARGE NOTE:  Upon discharge, the patient was able to tolerate fluids and was in no acute distress.  The patient was oriented to person, place and time  and vital signs were stable. Appropriate post-procedure instructions were provided and explained to the patient in detail and all questions were answered.    August Luz, MD 08/22/2011 9:51 AM

## 2011-08-22 NOTE — Procedures (Signed)
[facility]    PROCEDURE REPORT      PATIENT:  Susan Mclaughlin  DATE OF BIRTH:  01/31/1956  DATE OF SERVICE:  08/22/2011  SITE:  [facility]    PRE-PROCEDURE DIAGNOSIS:  See Above    POST-PROCEDURE DIAGNOSIS:  See Above                PROCEDURE:  1. Bilateral diagnostic lumbar facet injections (via the L2, L3, L4 medial branch nerve blocks) ([id number], 64494)  2. Fluoroscopic needle guidance ([id number])    LEVELS TREATED:  Bilateral L3-4, L4-5    ANESTHESIA:  See Medication Administration Record    COMPLICATIONS: None.    PHYSICIAN:  Kerrigan Glendening J Dianara Smullen, MD    PRE-PROCEDURE NOTE:  Pre-procedural assessment of the patient was performed including a limited history and physical examination. The details of the procedure were discussed with the patient, including the risks, benefits and alternative options and an informed consent was obtained. The patient???s NPO status, if necessary for the specific procedure and/or administration of moderate intravenous sedation, if utilized, and availability of a responsible adult to escort the patient following the procedure were confirmed.    PROCEDURE NOTE:  The patient was brought to the procedure suite and positioned on the fluoroscopy table in the prone position. Physiologic monitors were applied and supplemental oxygen was administered via nasal cannula. The skin was prepped in the standard surgical fashion and sterile drapes were applied over the procedure site. Please refer to the Flowsheet for documentation of the patient???s vital signs and the Medication Administration Record for any oral and/or intravenous sedation administered prior to or during the procedure.    1% Lidocaine was utilized for local anesthesia. Under AP fluoroscopic guidance a 22-gauge, 3-1/2 inch short bevel spinal needle was advanced to the junction of the superior articular process and transverse process of each  vertebral level immediately inferior to the above-mentioned dorsal rami medial branch nerves. A needle was also placed along the sacral ala to block the L5 medial branch nerve. After all needles were placed, 1 mL of 2% lidocaine was injected at each location after the negative aspiration of blood, air or CSF. The needles were removed and the stilets were replaced. The procedure was performed on the contralateral side in the same fashion and at the same levels using the same volume of local anesthetic following negative aspiration of blood, air or CSF. The needles were removed intact.  The area was thoroughly cleaned and sterile bandages applied as necessary. The patient tolerated the procedure well and vital signs remained stable throughout the procedure.    The patient was assessed immediately following the procedure and was noted to have greater than 50% reduction in pain (a reduction from 8/10 to 3/10 in severity of lumbar pain). Based on these results, the patient was considered an appropriate candidate for radiofrequency ablation of the above-mentioned medial branch nerves and will be scheduled for that procedure.     POST-PROCEDURE COURSE:  The patient was escorted from the procedure suite in satisfactory condition and recovered per facility protocol based on the type of procedure performed and/or the sedation utilized. The patient did not experience any adverse events and remained hemodynamically stable during the post-procedure period.      DISCHARGE NOTE:  Upon discharge, the patient was able to tolerate fluids and was in no acute distress.  The patient was oriented to person, place and time   and vital signs were stable. Appropriate post-procedure instructions were provided and explained to the patient in detail and all questions were answered.    Jennae Hakeem J Mikayla Chiusano, MD 08/22/2011 9:51 AM

## 2011-08-22 NOTE — Interval H&P Note (Signed)
Date of Surgery Update:  EISLEY BARBER was seen and examined.  History and physical has been reviewed. There have been no significant clinical changes since the completion of the originally dated History and Physical.    Signed By: August Luz, MD     August 22, 2011 9:43 AM

## 2011-08-31 ENCOUNTER — Inpatient Hospital Stay: Payer: MEDICARE

## 2011-08-31 MED ADMIN — lidocaine (pf) (XYLOCAINE) 10 mg/mL (1 %) injection: @ 14:00:00 | NDC 54569319800

## 2011-08-31 MED ADMIN — midazolam (VERSED) injection: INTRAVENOUS | @ 14:00:00 | NDC 63323041112

## 2011-08-31 MED ADMIN — ropivacaine (NAROPIN) injection: @ 14:00:00 | NDC 63323028510

## 2011-08-31 MED ADMIN — fentaNYL citrate (PF) injection: INTRAVENOUS | @ 14:00:00 | NDC 00409127632

## 2011-08-31 MED ADMIN — betamethasone (CELESTONE) injection: @ 14:00:00 | NDC 99990502503

## 2011-08-31 MED FILL — KENALOG 40 MG/ML SUSPENSION FOR INJECTION: 40 mg/mL | INTRAMUSCULAR | Qty: 1

## 2011-08-31 MED FILL — BETAMETHASONE ACET & SOD PHOS 6 MG/ML SUSP FOR INJECTION: 6 mg/mL | INTRAMUSCULAR | Qty: 10

## 2011-08-31 MED FILL — SODIUM CHLORIDE 0.9 % IV: INTRAVENOUS | Qty: 1000

## 2011-08-31 MED FILL — FENTANYL CITRATE (PF) 50 MCG/ML IJ SOLN: 50 mcg/mL | INTRAMUSCULAR | Qty: 2

## 2011-08-31 MED FILL — XYLOCAINE-MPF 10 MG/ML (1 %) INJECTION SOLUTION: 10 mg/mL (1 %) | INTRAMUSCULAR | Qty: 10

## 2011-08-31 MED FILL — MIDAZOLAM 1 MG/ML IJ SOLN: 1 mg/mL | INTRAMUSCULAR | Qty: 2

## 2011-08-31 MED FILL — NAROPIN (PF) 2 MG/ML (0.2 %) INJECTION SOLUTION: 2 mg/mL (0. %) | INTRAMUSCULAR | Qty: 10

## 2011-08-31 MED FILL — BD POSIFLUSH NORMAL SALINE 0.9 % INJECTION SYRINGE: INTRAMUSCULAR | Qty: 10

## 2011-08-31 NOTE — Procedures (Signed)
[facility]    PROCEDURE REPORT      PATIENT:  Susan Mclaughlin  DATE OF BIRTH:  [birth year]  DATE OF SERVICE:  08/31/2011  [facility]    PRE-PROCEDURE DIAGNOSIS:  See Above    POST-PROCEDURE DIAGNOSIS:  See Above                PROCEDURE:    1. Left radiofrequency thermocoagulation of lumbar medial branch nerves, L2, L3, L4 (64635, 64636 x1)  2. Fluoroscopic needle guidance (spinal) ([id number])  3. Supervision of moderate sedation [phone]([id number])    ANESTHESIA:  Local with moderate IV sedation. See Medication Administration Record for specific medications and dosage.    COMPLICATIONS: None.    PHYSICIAN:  August Luzonald J Ravon Mcilhenny, MD    PRE-PROCEDURE NOTE:  Pre-procedural assessment of the patient was performed including a limited history and physical examination.  The details of the procedure were discussed with the patient, including the risks, benefits and alternative options and an informed consent was obtained. The patient???s NPO status, if necessary for the specific procedure and/or administration of moderate intravenous sedation, if utilized, and availability of a responsible adult to escort the patient following the procedure were confirmed.    A peripheral intravenous cannula was placed without difficulty and lactated Ringer???s solution administered. See nursing notes for details.    PROCEDURE NOTE:  The patient was brought to the procedure suite and positioned on the fluoroscopy table in the prone position. Physiologic monitors were applied and supplemental oxygen was administered via nasal cannula. The skin was prepped in the standard surgical fashion and sterile drapes were applied over the procedure site. Please refer to the Flowsheet for documentation of the patient???s vital signs and the Medication Administration Record for any oral and/or intravenous sedation administered prior to or during the procedure.    1%  Lidocaine was utilized for local anesthesia. Under 10-15 degree ipsilateral oblique fluoroscopic guidance a 10 cm 18 gauge radiofrequency needle with a 10 mm curved active tip was advanced to the junction of the superior articular process and transverse process of each vertebral level immediately inferior to the above-mentioned dorsal rami medial branch nerves. After each individual needle was placed, sensory and motor testing, at 50 Hz and 2 Hz, respectively, were performed which elicited ipsilateral deep local back discomfort without evidence of motor stimulation in the ipsilateral gluteal muscles or extremity. Following this, 2 mL of a solution comprised of 1 mL of triamcinolone (40 mg/mL), 2 mL of 0.2% ropivacaine and 3 mL of 1% lidocaine was injected after the negative aspiration of blood, air or CSF. Final correct needle placement was then confirmed by viewing each needle in AP and lateral fluoroscopic views in addition to repeat sensory and motor testing which, again, elicited ipsilateral deep local back discomfort without evidence of motor stimulation in the ipsilateral gluteal muscles or extremity.  Finally, medial branch nerve radiofrequency thermocoagulation was then performed at each level for 90 seconds at 80?? centigrade x 1 cycle. Each needle was removed intact.  The area was thoroughly cleaned and sterile bandages applied as necessary. The patient tolerated the procedure well without complication and the vital signs remained stable throughout the procedure.    POST-PROCEDURE COURSE:   The patient was escorted from the procedure suite in satisfactory condition and recovered per facility protocol based on the type of procedure performed and/or the sedation utilized. The patient did not experience  any adverse events and remained hemodynamically stable during the post-procedure period.    DISCHARGE NOTE:  Upon discharge, the patient was able to tolerate fluids and was in no acute distress.  The patient  was oriented to person, place and time and vital signs were stable. Appropriate post-procedure instructions were provided and explained to the patient in detail and all questions were answered.    August Luz, MD 08/31/2011 10:17 AM

## 2011-08-31 NOTE — Progress Notes (Signed)
Error in scheduling.

## 2011-08-31 NOTE — Interval H&P Note (Signed)
H&P Update:  Susan Mclaughlin was seen and examined.  History and physical has been reviewed. There have been no significant clinical changes since the completion of the originally dated History and Physical.    Signed By: August Luz, MD     August 31, 2011 10:16 AM

## 2011-08-31 NOTE — H&P (Signed)
[facility]  Brief Pre-Procedure History & Physical    PATIENT NAME:  Susan Mclaughlin   DATE OF BIRTH:  [birth year]  DATE OF SERVICE:  08/31/2011      CHIEF COMPLAINT:  Pain    HISTORY OF PRESENT ILLNESS:  Susan Mclaughlin presents today for a previously diagnosed problem contributing to some or all of this patient???s pain. The location and pattern of the pain has not changed substantially since the last visit in our office. No other significant medical changes have occurred in the last 30 days.    PAST MEDICAL HISTORY:  The patient  has a past medical history of Depression; Degenerative disc disease; Arthritis; Scoliosis; Migraine headache; pulmonary embolism (2009); Sleep apnea; GERD (gastroesophageal reflux disease); Urge incontinence of urine; Low back pain; ADD (attention deficit disorder); Headache; Esophageal reflux; Arthritis; and Heartburn.    PAST SURGICAL HISTORY:  The patient  has past surgical history that includes back surgery (08/01); total abdominal hysterectomy (1984); cholecystectomy; appendectomy; breast biopsy; drainage of hematoma/fluid (2004); other surgical; other surgical (08/03/2010); and breast lumpectomy.    CURRENT MEDICATIONS:  See Medication Administration Record [facility]) in the patient's electronic record.    ALLERGIES:    Allergies   Allergen Reactions   ??? Codeine Unknown (comments)   ??? Erythromycin Unknown (comments)   ??? Sulfa (Sulfonamide Antibiotics) Unknown (comments)       FAMILY HISTORY:  The patient family history includes Diabetes in her daughter and father; Heart Disease in her brother; Heart Surgery in her father; Heart defect in her mother; and High Cholesterol in her brother and mother.  There is no history of Other.    SOCIAL HISTORY:  The patient  reports that she quit smoking about 3 years ago. Her smoking use included Cigarettes. She has a 32 pack-year smoking history. She does not have any smokeless tobacco history on file. The  patient  reports that she does not drink alcohol. She also  reports that she does not use illicit drugs.    REVIEW OF SYSTEMS:  Susan Mclaughlin denies any fever, chills, unexplained weight loss, use of antibiotics for recent infection or bleeding abnormalities.    PHYSICAL EXAM:  VS: BP 147/87   Pulse 92   Temp 97.1 ??F (36.2 ??C)   Resp 14   Ht 5\' 1"  (1.549 m)   Wt 72.576 kg (160 lb)   BMI 30.23 kg/m2   SpO2 96%  Gen: Well-developed. Body habitus consistent with recorded height and weight and the calculated BMI. No apparent distress.  Head: Normocephalic, atraumatic.  Skin: No obvious rashes, lesions or infection.  Pulm: Respirations are even and unlabored.   Psych:    Mood, affect and speech ??? Appropriate.    ASSESSMENT:   1. Lumbar spondylosis    PLAN:  Proceed with scheduled procedure.     August Luz, MD 08/31/2011 10:16 AM

## 2011-08-31 NOTE — Addendum Note (Signed)
Addended by: Loudon Krakow JONATHAN on: 08/31/2011 08:13 AM     Modules accepted: Orders

## 2011-08-31 NOTE — Telephone Encounter (Signed)
All pre procedure instructions given pertaining to Radiofrequency. Patient instructed to stop  taking Ibuprofen and self administered aspirin. Patient will be accompanied by daughter / medicaid transportation to procedure suite.

## 2011-08-31 NOTE — Procedures (Signed)
[facility]    PROCEDURE REPORT      PATIENT:  Susan Mclaughlin  DATE OF BIRTH:  12/03/1955  DATE OF SERVICE:  08/31/2011  SITE:  [facility] Special Procedures Suite    PRE-PROCEDURE DIAGNOSIS:  See Above    POST-PROCEDURE DIAGNOSIS:  See Above                PROCEDURE:    1. Left radiofrequency thermocoagulation of lumbar medial branch nerves, L2, L3, L4 (64635, 64636 x1)  2. Fluoroscopic needle guidance (spinal) (77003)  3. Supervision of moderate sedation (99144)    ANESTHESIA:  Local with moderate IV sedation. See Medication Administration Record for specific medications and dosage.    COMPLICATIONS: None.    PHYSICIAN:  Kitai Purdom J Bradan Congrove, MD    PRE-PROCEDURE NOTE:  Pre-procedural assessment of the patient was performed including a limited history and physical examination.  The details of the procedure were discussed with the patient, including the risks, benefits and alternative options and an informed consent was obtained. The patient???s NPO status, if necessary for the specific procedure and/or administration of moderate intravenous sedation, if utilized, and availability of a responsible adult to escort the patient following the procedure were confirmed.    A peripheral intravenous cannula was placed without difficulty and lactated Ringer???s solution administered. See nursing notes for details.    PROCEDURE NOTE:  The patient was brought to the procedure suite and positioned on the fluoroscopy table in the prone position. Physiologic monitors were applied and supplemental oxygen was administered via nasal cannula. The skin was prepped in the standard surgical fashion and sterile drapes were applied over the procedure site. Please refer to the Flowsheet for documentation of the patient???s vital signs and the Medication Administration Record for any oral and/or intravenous sedation administered prior to or during the procedure.    1%  Lidocaine was utilized for local anesthesia. Under 10-15 degree ipsilateral oblique fluoroscopic guidance a 10 cm 18 gauge radiofrequency needle with a 10 mm curved active tip was advanced to the junction of the superior articular process and transverse process of each vertebral level immediately inferior to the above-mentioned dorsal rami medial branch nerves. After each individual needle was placed, sensory and motor testing, at 50 Hz and 2 Hz, respectively, were performed which elicited ipsilateral deep local back discomfort without evidence of motor stimulation in the ipsilateral gluteal muscles or extremity. Following this, 2 mL of a solution comprised of 1 mL of triamcinolone (40 mg/mL), 2 mL of 0.2% ropivacaine and 3 mL of 1% lidocaine was injected after the negative aspiration of blood, air or CSF. Final correct needle placement was then confirmed by viewing each needle in AP and lateral fluoroscopic views in addition to repeat sensory and motor testing which, again, elicited ipsilateral deep local back discomfort without evidence of motor stimulation in the ipsilateral gluteal muscles or extremity.  Finally, medial branch nerve radiofrequency thermocoagulation was then performed at each level for 90 seconds at 80?? centigrade x 1 cycle. Each needle was removed intact.  The area was thoroughly cleaned and sterile bandages applied as necessary. The patient tolerated the procedure well without complication and the vital signs remained stable throughout the procedure.    POST-PROCEDURE COURSE:   The patient was escorted from the procedure suite in satisfactory condition and recovered per facility protocol based on the type of procedure performed and/or the sedation utilized. The patient did not experience   any adverse events and remained hemodynamically stable during the post-procedure period.    DISCHARGE NOTE:  Upon discharge, the patient was able to tolerate fluids and was in no acute distress.  The patient  was oriented to person, place and time and vital signs were stable. Appropriate post-procedure instructions were provided and explained to the patient in detail and all questions were answered.    Tilden Broz J Mikaella Escalona, MD 08/31/2011 10:17 AM

## 2011-10-03 MED ORDER — GABAPENTIN 600 MG TAB
600 mg | ORAL_TABLET | Freq: Three times a day (TID) | ORAL | Status: AC
Start: 2011-10-03 — End: 2011-11-02

## 2011-10-03 MED ORDER — METAXALONE 800 MG TAB
800 mg | ORAL_TABLET | Freq: Three times a day (TID) | ORAL | Status: AC | PRN
Start: 2011-10-03 — End: 2011-11-02

## 2011-10-03 MED ORDER — HYDROCODONE-ACETAMINOPHEN 7.5 MG-325 MG TAB
ORAL_TABLET | Freq: Three times a day (TID) | ORAL | Status: AC | PRN
Start: 2011-10-03 — End: 2011-11-02

## 2011-10-03 NOTE — H&P (View-Only) (Signed)
Pill count  [facility] 7.5/325 fill date 07/03/11 #40  HISTORY OF PRESENT ILLNESS  Susan Mclaughlin is a 56 y.o. female.  HPI Comments: Meds help with pain control and quality of life.  No new side effects reported today.No new medical problems reported today.Visit survey reviewed, and will be scanned.   Chief complaint, low back pain, leg symptoms. Neck pain. The pain.  Using gabapentin, hydrocodone, metaxalone.  Increased stress recently, her father fell and suffered a hip fracture. Recent surgery.      Review of Systems   Constitutional: Negative for fever and chills.   HENT: Positive for neck pain. Negative for sore throat.    Eyes: Negative for blurred vision and double vision.   Respiratory: Negative for cough and shortness of breath.    Cardiovascular: Positive for leg swelling. Negative for chest pain.   Gastrointestinal: Positive for heartburn, nausea and constipation.   Genitourinary: Negative for dysuria and urgency.   Musculoskeletal: Positive for back pain and joint pain.   Skin: Negative for itching and rash.   Neurological: Positive for headaches. Negative for dizziness and seizures.   Endo/Heme/Allergies: Positive for environmental allergies. Does not bruise/bleed easily.   Psychiatric/Behavioral: Positive for depression and memory loss. Negative for suicidal ideas. The patient is nervous/anxious and has insomnia.        Physical Exam   Nursing note and vitals reviewed.  Constitutional: She appears well-developed and well-nourished. She is cooperative. She does not have a sickly appearance.   HENT:   Head: Normocephalic and atraumatic.   Right Ear: External ear normal. No drainage.   Left Ear: External ear normal. No drainage.   Nose: Nose normal.   Eyes: Lids are normal. Right eye exhibits no discharge. Left eye exhibits no discharge. Right conjunctiva has no hemorrhage. Left conjunctiva has no hemorrhage.   Neck: Neck supple. No tracheal deviation present. No mass present.   Pulmonary/Chest: Effort  normal. No respiratory distress.   Neurological: She is alert. No cranial nerve deficit.        Gait with cane   Skin: Skin is intact. No rash noted. She is not diaphoretic. No erythema.   Psychiatric: Her speech is normal and behavior is normal. Judgment and thought content normal. Her mood appears not anxious. Her affect is not angry. Cognition and memory are normal. She does not express inappropriate judgment. She does not exhibit a depressed mood.       ASSESSMENT and PLAN  Encounter Diagnoses   Name Primary?   ??? Back pain Yes   ??? Radiculitis, lumbosacral    ??? Piriformis syndrome of left side    ??? Hypotension    ??? Nausea    ??? Difficulty walking    ??? Chest pain    ??? Headache    ??? Recurrent UTI    ??? UTI (lower urinary tract infection)    ??? LBP (low back pain)    ??? Encounter for long-term (current) use of other medications    ??? Chronic pain syndrome    ??? Knee pain, bilateral    ??? S/P lumbar fusion    ??? OA (osteoarthritis)    ??? DJD (degenerative joint disease) of lumbar spine    ??? History of depression    ??? ADD (attention deficit disorder)    ??? Lumbosacral spondylosis without myelopathy    ??? Sacroiliitis, not elsewhere classified    ??? Postlaminectomy syndrome, lumbar region    No signs of addiction or diversion.The primary Dxes.,including pain--and  the associated Dxes/Comorbitities are controlled.  Pain/Pain control/Meds and Quality Of Life have been reviewed.  Possible changes to treatment plan considered/reviewed.Support/education given.  Today-no significant change to meds.  Gabapentin, 600 mg, 3 times a day  Hydrocodone, 7.5 mg, 3 times a day as needed  Metaxalone, 800 mg 3 times a day  Follow up -- 2 months.

## 2011-10-03 NOTE — Progress Notes (Signed)
Pill count  [facility]co 7.5/325 fill date 07/03/11 #40  HISTORY OF PRESENT ILLNESS  Susan Mclaughlin is a 56 y.o. female.  HPI Comments: Meds help with pain control and quality of life.  No new side effects reported today.No new medical problems reported today.Visit survey reviewed, and will be scanned.   Chief complaint, low back pain, leg symptoms. Neck pain. The pain.  Using gabapentin, hydrocodone, metaxalone.  Increased stress recently, her father fell and suffered a hip fracture. Recent surgery.      Review of Systems   Constitutional: Negative for fever and chills.   HENT: Positive for neck pain. Negative for sore throat.    Eyes: Negative for blurred vision and double vision.   Respiratory: Negative for cough and shortness of breath.    Cardiovascular: Positive for leg swelling. Negative for chest pain.   Gastrointestinal: Positive for heartburn, nausea and constipation.   Genitourinary: Negative for dysuria and urgency.   Musculoskeletal: Positive for back pain and joint pain.   Skin: Negative for itching and rash.   Neurological: Positive for headaches. Negative for dizziness and seizures.   Endo/Heme/Allergies: Positive for environmental allergies. Does not bruise/bleed easily.   Psychiatric/Behavioral: Positive for depression and memory loss. Negative for suicidal ideas. The patient is nervous/anxious and has insomnia.        Physical Exam   Nursing note and vitals reviewed.  Constitutional: She appears well-developed and well-nourished. She is cooperative. She does not have a sickly appearance.   HENT:   Head: Normocephalic and atraumatic.   Right Ear: External ear normal. No drainage.   Left Ear: External ear normal. No drainage.   Nose: Nose normal.   Eyes: Lids are normal. Right eye exhibits no discharge. Left eye exhibits no discharge. Right conjunctiva has no hemorrhage. Left conjunctiva has no hemorrhage.   Neck: Neck supple. No tracheal deviation present. No mass present.   Pulmonary/Chest: Effort  normal. No respiratory distress.   Neurological: She is alert. No cranial nerve deficit.        Gait with cane   Skin: Skin is intact. No rash noted. She is not diaphoretic. No erythema.   Psychiatric: Her speech is normal and behavior is normal. Judgment and thought content normal. Her mood appears not anxious. Her affect is not angry. Cognition and memory are normal. She does not express inappropriate judgment. She does not exhibit a depressed mood.       ASSESSMENT and PLAN  Encounter Diagnoses   Name Primary?   ??? Back pain Yes   ??? Radiculitis, lumbosacral    ??? Piriformis syndrome of left side    ??? Hypotension    ??? Nausea    ??? Difficulty walking    ??? Chest pain    ??? Headache    ??? Recurrent UTI    ??? UTI (lower urinary tract infection)    ??? LBP (low back pain)    ??? Encounter for long-term (current) use of other medications    ??? Chronic pain syndrome    ??? Knee pain, bilateral    ??? S/P lumbar fusion    ??? OA (osteoarthritis)    ??? DJD (degenerative joint disease) of lumbar spine    ??? History of depression    ??? ADD (attention deficit disorder)    ??? Lumbosacral spondylosis without myelopathy    ??? Sacroiliitis, not elsewhere classified    ??? Postlaminectomy syndrome, lumbar region    No signs of addiction or diversion.The primary Dxes.,including pain--and   the associated Dxes/Comorbitities are controlled.  Pain/Pain control/Meds and Quality Of Life have been reviewed.  Possible changes to treatment plan considered/reviewed.Support/education given.  Today-no significant change to meds.  Gabapentin, 600 mg, 3 times a day  Hydrocodone, 7.5 mg, 3 times a day as needed  Metaxalone, 800 mg 3 times a day  Follow up -- 2 months.

## 2011-10-24 ENCOUNTER — Inpatient Hospital Stay: Payer: MEDICARE

## 2011-10-24 MED ADMIN — ropivacaine (NAROPIN) injection: @ 17:00:00 | NDC 63323028510

## 2011-10-24 MED ADMIN — lidocaine (pf) (XYLOCAINE) 10 mg/mL (1 %) injection: @ 17:00:00 | NDC 54569319800

## 2011-10-24 MED ADMIN — triamcinolone acetonide (KENALOG-40) 40 mg/mL injection: @ 17:00:00 | NDC 00003029305

## 2011-10-24 MED ADMIN — midazolam (VERSED) injection: INTRAVENOUS | @ 17:00:00 | NDC 63323041112

## 2011-10-24 MED ADMIN — fentaNYL citrate (PF) injection: INTRAVENOUS | @ 17:00:00 | NDC 00409127632

## 2011-10-24 MED FILL — XYLOCAINE-MPF 10 MG/ML (1 %) INJECTION SOLUTION: 10 mg/mL (1 %) | INTRAMUSCULAR | Qty: 10

## 2011-10-24 MED FILL — SODIUM CHLORIDE 0.9 % IV: INTRAVENOUS | Qty: 1000

## 2011-10-24 MED FILL — BD POSIFLUSH NORMAL SALINE 0.9 % INJECTION SYRINGE: INTRAMUSCULAR | Qty: 10

## 2011-10-24 MED FILL — NAROPIN (PF) 2 MG/ML (0.2 %) INJECTION SOLUTION: 2 mg/mL (0. %) | INTRAMUSCULAR | Qty: 10

## 2011-10-24 MED FILL — MIDAZOLAM 1 MG/ML IJ SOLN: 1 mg/mL | INTRAMUSCULAR | Qty: 2

## 2011-10-24 MED FILL — FENTANYL CITRATE (PF) 50 MCG/ML IJ SOLN: 50 mcg/mL | INTRAMUSCULAR | Qty: 2

## 2011-10-24 MED FILL — KENALOG 40 MG/ML SUSPENSION FOR INJECTION: 40 mg/mL | INTRAMUSCULAR | Qty: 1

## 2011-10-24 NOTE — Procedures (Signed)
THE [facility]    PROCEDURE REPORT      PATIENT:  Susan Mclaughlin  DATE OF BIRTH:  [birth year]  DATE OF SERVICE:  10/24/2011  SITE:  [facility] Special Procedures Suite    PRE-PROCEDURE DIAGNOSIS:  See Above    POST-PROCEDURE DIAGNOSIS:  See Above                PROCEDURE:    1. Right radiofrequency thermocoagulation of lumbar medial branch nerves, L2, L3, L4 (64635, 64636 x1)  2. Fluoroscopic needle guidance (spinal) (47425)  3. Supervision of moderate sedation [phone])    ANESTHESIA:  Local with moderate IV sedation. See Medication Administration Record for specific medications and dosage.    COMPLICATIONS: None.    PHYSICIAN:  August Luz, MD    PRE-PROCEDURE NOTE:  Pre-procedural assessment of the patient was performed including a limited history and physical examination.  The details of the procedure were discussed with the patient, including the risks, benefits and alternative options and an informed consent was obtained. The patient???s NPO status, if necessary for the specific procedure and/or administration of moderate intravenous sedation, if utilized, and availability of a responsible adult to escort the patient following the procedure were confirmed.    A peripheral intravenous cannula was placed without difficulty and lactated Ringer???s solution administered. See nursing notes for details.    PROCEDURE NOTE:  The patient was brought to the procedure suite and positioned on the fluoroscopy table in the prone position. Physiologic monitors were applied and supplemental oxygen was administered via nasal cannula. The skin was prepped in the standard surgical fashion and sterile drapes were applied over the procedure site. Please refer to the Flowsheet for documentation of the patient???s vital signs and the Medication Administration Record for any oral and/or intravenous sedation administered prior to or during the  procedure.    1% Lidocaine was utilized for local anesthesia. Under 10-15 degree ipsilateral oblique fluoroscopic guidance a 10 cm 18 gauge radiofrequency needle with a 10 mm curved active tip was advanced to the junction of the superior articular process and transverse process of each vertebral level immediately inferior to the above-mentioned dorsal rami medial branch nerves. After each individual needle was placed, sensory and motor testing, at 50 Hz and 2 Hz, respectively, were performed which elicited ipsilateral deep local back discomfort without evidence of motor stimulation in the ipsilateral gluteal muscles or extremity. Following this, 2 mL of a solution comprised of 1 mL of triamcinolone (40 mg/mL), 2 mL of 0.2% ropivacaine and 3 mL of 1% lidocaine was injected after the negative aspiration of blood, air or CSF. Final correct needle placement was then confirmed by viewing each needle in AP and lateral fluoroscopic views in addition to repeat sensory and motor testing which, again, elicited ipsilateral deep local back discomfort without evidence of motor stimulation in the ipsilateral gluteal muscles or extremity.  Finally, medial branch nerve radiofrequency thermocoagulation was then performed at each level for 90 seconds at 80?? centigrade x 1 cycle. Each needle was removed intact.  The area was thoroughly cleaned and sterile bandages applied as necessary. The patient tolerated the procedure well without complication and the vital signs remained stable throughout the procedure.    POST-PROCEDURE COURSE:   The patient was escorted from the procedure suite in satisfactory condition and recovered per facility protocol based on the type of procedure performed and/or the sedation utilized. The patient did not experience  any adverse events and remained hemodynamically stable during the post-procedure period.    DISCHARGE NOTE:  Upon discharge, the patient was able to tolerate fluids and was in no acute  distress.  The patient was oriented to person, place and time and vital signs were stable. Appropriate post-procedure instructions were provided and explained to the patient in detail and all questions were answered.    August Luzonald J Chrishawn Boley, MD 10/24/2011 1:06 PM

## 2011-10-24 NOTE — Procedures (Signed)
THE [place] [facility] [facility]    PROCEDURE REPORT      PATIENT:  Susan Mclaughlin  DATE OF BIRTH:  02-Feb-1956  DATE OF SERVICE:  10/24/2011  SITE:  [facility] Special Procedures Suite    PRE-PROCEDURE DIAGNOSIS:  See Above    POST-PROCEDURE DIAGNOSIS:  See Above                PROCEDURE:    1. Right radiofrequency thermocoagulation of lumbar medial branch nerves, L2, L3, L4 (64635, 64636 x1)  2. Fluoroscopic needle guidance (spinal) (29562)  3. Supervision of moderate sedation [phone]    ANESTHESIA:  Local with moderate IV sedation. See Medication Administration Record for specific medications and dosage.    COMPLICATIONS: None.    PHYSICIAN:  August Luz, MD    PRE-PROCEDURE NOTE:  Pre-procedural assessment of the patient was performed including a limited history and physical examination.  The details of the procedure were discussed with the patient, including the risks, benefits and alternative options and an informed consent was obtained. The patient???s NPO status, if necessary for the specific procedure and/or administration of moderate intravenous sedation, if utilized, and availability of a responsible adult to escort the patient following the procedure were confirmed.    A peripheral intravenous cannula was placed without difficulty and lactated Ringer???s solution administered. See nursing notes for details.    PROCEDURE NOTE:  The patient was brought to the procedure suite and positioned on the fluoroscopy table in the prone position. Physiologic monitors were applied and supplemental oxygen was administered via nasal cannula. The skin was prepped in the standard surgical fashion and sterile drapes were applied over the procedure site. Please refer to the Flowsheet for documentation of the patient???s vital signs and the Medication Administration Record for any oral and/or intravenous sedation administered prior to or during the procedure.     1% Lidocaine was utilized for local anesthesia. Under 10-15 degree ipsilateral oblique fluoroscopic guidance a 10 cm 18 gauge radiofrequency needle with a 10 mm curved active tip was advanced to the junction of the superior articular process and transverse process of each vertebral level immediately inferior to the above-mentioned dorsal rami medial branch nerves. After each individual needle was placed, sensory and motor testing, at 50 Hz and 2 Hz, respectively, were performed which elicited ipsilateral deep local back discomfort without evidence of motor stimulation in the ipsilateral gluteal muscles or extremity. Following this, 2 mL of a solution comprised of 1 mL of triamcinolone (40 mg/mL), 2 mL of 0.2% ropivacaine and 3 mL of 1% lidocaine was injected after the negative aspiration of blood, air or CSF. Final correct needle placement was then confirmed by viewing each needle in AP and lateral fluoroscopic views in addition to repeat sensory and motor testing which, again, elicited ipsilateral deep local back discomfort without evidence of motor stimulation in the ipsilateral gluteal muscles or extremity.  Finally, medial branch nerve radiofrequency thermocoagulation was then performed at each level for 90 seconds at 80?? centigrade x 1 cycle. Each needle was removed intact.  The area was thoroughly cleaned and sterile bandages applied as necessary. The patient tolerated the procedure well without complication and the vital signs remained stable throughout the procedure.    POST-PROCEDURE COURSE:   The patient was escorted from the procedure suite in satisfactory condition and recovered per facility protocol based on the type of procedure performed and/or the sedation utilized. The patient did not experience  any adverse events and remained hemodynamically stable during the post-procedure period.    DISCHARGE NOTE:  Upon discharge, the patient was able to tolerate fluids and was in no acute distress.  The  patient was oriented to person, place and time and vital signs were stable. Appropriate post-procedure instructions were provided and explained to the patient in detail and all questions were answered.    August Luz, MD 10/24/2011 1:06 PM

## 2011-10-24 NOTE — Interval H&P Note (Signed)
H&P Update:  Susan Mclaughlin was seen and examined.  History and physical has been reviewed. There have been no significant clinical changes since the completion of the originally dated History and Physical.  Patient identified by surgeon; surgical site was confirmed by patient and surgeon.    Signed By: August Luz, MD     October 24, 2011 12:46 PM

## 2012-02-14 NOTE — Telephone Encounter (Signed)
I called the subject regarding scheduling a study exit visit for the IOAB clinical trial. The subject is currently residing in [place] as she is providing care to her parents. The subject stated that she would not be able to come back to our office for a study exit visit therefore she would like to withdraw her consent for the IOAB clinical trial.

## 2012-03-28 ENCOUNTER — Encounter

## 2012-05-07 ENCOUNTER — Ambulatory Visit (INDEPENDENT_AMBULATORY_CARE_PROVIDER_SITE_OTHER): Payer: Medicare PPO | Admitting: Pulmonary Disease

## 2012-05-07 ENCOUNTER — Encounter: Payer: Self-pay | Admitting: Pulmonary Disease

## 2012-05-07 VITALS — BP 122/78 | HR 115 | Temp 97.4°F | Ht 62.0 in | Wt 181.2 lb

## 2012-05-07 DIAGNOSIS — G4733 Obstructive sleep apnea (adult) (pediatric): Secondary | ICD-10-CM | POA: Insufficient documentation

## 2012-05-07 MED ORDER — ZOLPIDEM TARTRATE 5 MG PO TABS: 5.0000 mg | ORAL_TABLET | Freq: Every evening | ORAL | Status: DC | PRN

## 2012-05-07 NOTE — Patient Instructions (Signed)
Will arrange for sleep study Will call to arrange for follow up after sleep study reviewed 

## 2012-05-07 NOTE — Assessment & Plan Note (Signed)
She has history of sleep apnea, hypertension, and depression.  She continues to have snoring, sleep disruption, and daytime sleepiness.  I am concerned she still has sleep apnea.  To further assess will arrange for in lab sleep study.  Will then set her up with CPAP again, and follow up after that.

## 2012-05-07 NOTE — Progress Notes (Signed)
  Subjective:    Patient ID: Chelsey Castro, female    DOB: 13-Jun-1955, 57 y.o.   MRN: [id number]  HPI    Review of Systems  Constitutional: Negative for appetite change and unexpected weight change.  HENT: Positive for congestion, sore throat and sneezing. Negative for ear pain, trouble swallowing and dental problem.   Respiratory: Negative for cough and shortness of breath.   Cardiovascular: Negative for chest pain, palpitations and leg swelling.  Gastrointestinal: Negative for abdominal pain.  Musculoskeletal: Positive for arthralgias. Negative for joint swelling.  Skin: Negative for rash.  Neurological: Positive for headaches.  Psychiatric/Behavioral: Positive for dysphoric mood. The patient is not nervous/anxious.        Objective:   Physical Exam        Assessment & Plan:

## 2012-05-07 NOTE — Addendum Note (Signed)
Addended by: Tommie Sams on: 05/07/2012 09:36 AM   Modules accepted: Orders

## 2012-05-07 NOTE — Progress Notes (Signed)
Chief Complaint  Patient presents with  . Advice Only    Pt currently has CPAP but is broken. has not used x 3-4 months. She c/o snoring. Had sleep study 4-5 [place]    History of Present Illness: Chelsey Castro is a 57 y.o. female for evaluation of sleep apnea.  She was diagnosed with sleep apnea while living in [place], and has been using CPAP.  She recently moved to [place].  Her CPAP machine has not been working.  As a result she was referred to pulmonary/sleep medicine.  She goes to bed between 9 and 11 pm.  She has trouble falling asleep. She does not use anything to help her sleep.  She wakes twice to use the bathroom.  She gets out of bed at 630 am.  She did not snore when her CPAP machine was working.  She has been using adderall.  She gets headaches in the morning since she is not using CPAP.  She is sleepy all the time.  She is snoring again, and wakes up frequently feeling like she can't catch her breath.  Her Epworth score is 23 out of 24.  The patient denies sleep walking, sleep talking, bruxism, or nightmares.  There is no history of restless legs.  The patient denies sleep hallucinations, sleep paralysis, or cataplexy.   Tests:    has a past medical history of Diabetes; Hyperlipemia; OSA (obstructive sleep apnea); Depression; and GERD (gastroesophageal reflux disease).   has past surgical history that includes Gallbladder surgery; appenedectomy; hysterectomy; Back surgery; Breast lumpectomy; Knee surgery; and Ankle surgery.   Prior to Admission medications   Medication Sig Start Date End Date Taking? Authorizing Provider  amphetamine-dextroamphetamine (ADDERALL) 20 MG tablet Take 20 mg by mouth 2 (two) times daily.   Yes Historical Provider, MD  ARIPiprazole (ABILIFY) 10 MG tablet Take 10 mg by mouth daily.   Yes Historical Provider, MD  buPROPion (WELLBUTRIN SR) 150 MG 12 hr tablet Take 150 mg by mouth 2 (two) times daily.   Yes Historical  Provider, MD  dexlansoprazole (DEXILANT) 60 MG capsule Take 60 mg by mouth daily.   Yes Historical Provider, MD  gabapentin (NEURONTIN) 600 MG tablet Take 600 mg by mouth 3 (three) times daily as needed.   Yes Historical Provider, MD  Linaclotide (LINZESS) 145 MCG CAPS Take 145 mcg by mouth daily.   Yes Historical Provider, MD  megestrol (MEGACE) 20 MG tablet Take 20 mg by mouth 2 (two) times daily.   Yes Historical Provider, MD  metaxalone (SKELAXIN) 800 MG tablet Take 800 mg by mouth 3 (three) times daily as needed for pain.   Yes Historical Provider, MD  simvastatin (ZOCOR) 20 MG tablet Take 20 mg by mouth every evening.   Yes Historical Provider, MD  trimethoprim (TRIMPEX) 100 MG tablet Take 100 mg by mouth daily.   Yes Historical Provider, MD    Allergies  Allergen Reactions  . Erythromycin   . Sulfa Antibiotics     family history includes Cancer in her sister; Diabetes in her father; Heart disease in her father; and High Cholesterol in her father.   reports that she has been smoking Cigarettes.  She has a 2 pack-year smoking history. She does not have any smokeless tobacco history on file. She reports that she does not drink alcohol or use illicit drugs.  Physical Exam:  General - No distress ENT - No sinus tenderness, no oral exudate, no LAN, no thyromegaly, TM  clear, pupils equal/reactive Cardiac - s1s2 regular, no murmur, pulses symmetric Chest - No wheeze/rales/dullness, good air entry, normal respiratory excursion Back - No focal tenderness Abd - Soft, non-tender, no organomegaly, + bowel sounds Ext - No edema Neuro - Normal strength, cranial nerves intact Skin - No rashes Psych - Normal mood, and behavior.   Assessment/Plan:  Coralyn Helling, MD [place] [facility]/Sleep Pager:  [phone] 05/07/2012, 9:22 AM

## 2012-05-19 ENCOUNTER — Encounter (HOSPITAL_BASED_OUTPATIENT_CLINIC_OR_DEPARTMENT_OTHER): Payer: Medicare PPO

## 2012-06-14 ENCOUNTER — Ambulatory Visit (HOSPITAL_BASED_OUTPATIENT_CLINIC_OR_DEPARTMENT_OTHER): Payer: Medicare PPO | Attending: Pulmonary Disease | Admitting: Radiology

## 2012-06-14 VITALS — Ht 61.0 in | Wt 180.0 lb

## 2012-06-14 DIAGNOSIS — G4733 Obstructive sleep apnea (adult) (pediatric): Secondary | ICD-10-CM

## 2012-06-24 DIAGNOSIS — G4733 Obstructive sleep apnea (adult) (pediatric): Secondary | ICD-10-CM

## 2012-06-25 ENCOUNTER — Telehealth: Payer: Self-pay | Admitting: Pulmonary Disease

## 2012-06-25 DIAGNOSIS — G4733 Obstructive sleep apnea (adult) (pediatric): Secondary | ICD-10-CM

## 2012-06-25 NOTE — Procedures (Signed)
NAME:  Chelsey Castro, Chelsey Castro NO.:  [id number]  MEDICAL RECORD NO.:  [id number]          PATIENT TYPE:  OUT  LOCATION:  SLEEP CENTER                 FACILITY:  [facility]  PHYSICIAN:  Coralyn Helling, MD        DATE OF BIRTH:  1955/10/21  DATE OF STUDY:  06/14/2012                           NOCTURNAL POLYSOMNOGRAM  REFERRING PHYSICIAN:  Coralyn Helling, MD  FACILITY:  [facility]  INDICATION FOR STUDY:  [name] is a 57 year old female who has a history of hypertension and depression.  She was also previously diagnosed with obstructive sleep apnea while living in [place].  She has not been on therapy for sleep apnea recently.  She continued to have snoring, sleep disruption, and daytime sleepiness.  She was referred to the Sleep Lab for further evaluation of hypersomnia with obstructive sleep apnea.  Height is 5 feet and 1 inches, weight is 180 pounds, BMI is 34.  Neck size is 14 inches.  EPWORTH SLEEPINESS SCORE:  16.  MEDICATIONS:  Adderall, fluticasone, metaxalone, gabapentin, trimethoprim, Dexilant, Linzess, and duloxetine.  SLEEP ARCHITECTURE:  Total recording time is 396 minutes.  Total sleep time was 323 minutes.  Sleep efficiency was 81%.  Sleep latency was 20 minutes.  REM latency was 350 minutes.  The patient was observed in all stages of sleep and she slept predominantly in the nonsupine position.  RESPIRATORY DATA:  The average respiratory rate was 22.  Moderate snoring was noted by the technician.  The overall apnea/hypopnea index was 5.7.  The supine apnea-hypopnea index was 20.2.  The nonsupine apnea- hypopnea index was 0.3.  The events were exclusively obstructive in nature.  OXYGEN DATA:  The baseline oxygenation was 94%.  The oxygen saturation nadir was 85%.  The patient spent a total of 0.2 minutes with an oxygen saturation below 88%.  The study was conducted with the patient using room air.  CARDIAC DATA:  The average heart rate was 81  and the rhythm strip showed sinus rhythm.  MOVEMENT-PARASOMNIA:  The periodic limb movement index was 3.5.  The patient had 1 restroom trip.  IMPRESSIONS-RECOMMENDATIONS:  This study shows evidence for mild obstructive sleep apnea with an apnea/hypopnea index of 5.7 and oxygen saturation nadir of 85%.  Of note is that she had a significant positional effect to her sleep-disordered breathing.  In addition, she had minimal amount of REM sleep and therefore, the severity of her sleep apnea could be underestimated.  In addition to diet, exercise, and weight reduction, additional therapeutic interventions could include positional therapy, CPAP therapy, oral appliance, or surgical intervention.     Coralyn Helling, MD Diplomat, American Board of Sleep Medicine    VS/MEDQ  D:  06/24/2012 10:35:55  T:  06/25/2012 03:50:44  Job:  [id number]

## 2012-06-25 NOTE — Telephone Encounter (Signed)
PSG 06/14/12 >> AHI 5.7, SpO2 low 85%, S 20.2, NS 0.3.  Left message explaining she has mild sleep apnea.  I have sent order for auto CPAP set up.  She will need follow up 2 months after CPAP set up.  Will have my nurse follow up with pt to ensure receipt of message, agreement with plan, and scheduling ROV.

## 2012-06-27 NOTE — Telephone Encounter (Signed)
Called and spoke with pt and she stated that she has not been set up with her cpap yet but uses [facility].  She is aware that she will need to be set up appt with [facility] for 2 months after she has been set up with cpap.  Will forward message back to mindy to follow up with pt on Monday.

## 2012-06-27 NOTE — Telephone Encounter (Signed)
Pt returned Mindy's call.  Holly D Pryor ° °

## 2012-06-27 NOTE — Telephone Encounter (Signed)
lmomtcb x1 for pt 

## 2012-07-03 NOTE — Telephone Encounter (Signed)
lmomtcb x1 for pt 

## 2012-07-03 NOTE — Telephone Encounter (Signed)
Pt has recall placed in system as his schedule is not out yet. Pt aware will call once schedule is out.

## 2012-07-17 ENCOUNTER — Encounter: Payer: Self-pay | Admitting: Pulmonary Disease

## 2012-07-25 ENCOUNTER — Telehealth: Payer: Self-pay | Admitting: Pulmonary Disease

## 2012-07-25 NOTE — Telephone Encounter (Signed)
CPAP 07/02/12 to 07/15/12 >> Used on 14 of 14 nights with average 8 hrs 42 min.  Average AHI 0.1 with median CPAP 8 cm H2O and 95 th percentile CPAP 12 cm H2O.   Will have my nurse inform pt that CPAP report looks very good.  She will need ROV in June 2014 to monitor status.

## 2012-07-26 NOTE — Telephone Encounter (Signed)
Returning call can be reached at [phone].Chelsey Castro

## 2012-07-26 NOTE — Telephone Encounter (Signed)
lmomtcb for pt 

## 2012-07-26 NOTE — Telephone Encounter (Signed)
LMTCB

## 2012-08-01 NOTE — Telephone Encounter (Signed)
Pt returned Mindy's call & can be reached at [phone].  Chelsey Castro

## 2012-08-01 NOTE — Telephone Encounter (Signed)
lmomtcb x1 

## 2012-08-01 NOTE — Telephone Encounter (Signed)
Pt is aware of download results. She has been scheduled for 10/07/12 at 9:30am. Need an AM appointment due to prior commitments. Nothing further was needed.

## 2012-10-07 ENCOUNTER — Ambulatory Visit: Payer: Medicare PPO | Admitting: Pulmonary Disease

## 2012-11-20 ENCOUNTER — Encounter: Payer: Self-pay | Admitting: Pulmonary Disease

## 2012-11-20 ENCOUNTER — Ambulatory Visit: Payer: Medicare PPO | Admitting: Pulmonary Disease

## 2012-11-21 ENCOUNTER — Encounter: Payer: Self-pay | Admitting: Pulmonary Disease

## 2013-04-18 ENCOUNTER — Encounter (HOSPITAL_COMMUNITY): Payer: Self-pay | Admitting: Emergency Medicine

## 2013-04-18 ENCOUNTER — Emergency Department (INDEPENDENT_AMBULATORY_CARE_PROVIDER_SITE_OTHER): Payer: Medicare PPO

## 2013-04-18 ENCOUNTER — Emergency Department (INDEPENDENT_AMBULATORY_CARE_PROVIDER_SITE_OTHER)
Admission: EM | Admit: 2013-04-18 | Discharge: 2013-04-18 | Disposition: A | Discharge status: Home / Self Care | Payer: Medicare PPO | Source: Home / Self Care | Attending: Emergency Medicine | Admitting: Emergency Medicine

## 2013-04-18 DIAGNOSIS — S8001XA Contusion of right knee, initial encounter: Secondary | ICD-10-CM

## 2013-04-18 DIAGNOSIS — J029 Acute pharyngitis, unspecified: Secondary | ICD-10-CM

## 2013-04-18 DIAGNOSIS — S8000XA Contusion of unspecified knee, initial encounter: Secondary | ICD-10-CM

## 2013-04-18 LAB — POCT RAPID STREP A: STREPTOCOCCUS, GROUP A SCREEN (DIRECT): NEGATIVE

## 2013-04-18 MED ORDER — HYDROCODONE-ACETAMINOPHEN 5-325 MG PO TABS
ORAL_TABLET | ORAL | Status: AC
  Filled 2013-04-18: qty 2

## 2013-04-18 MED ORDER — AMOXICILLIN 500 MG PO CAPS: 500.0000 mg | ORAL_CAPSULE | Freq: Three times a day (TID) | ORAL | Status: DC

## 2013-04-18 MED ORDER — HYDROCODONE-ACETAMINOPHEN 5-325 MG PO TABS
2.0000 | ORAL_TABLET | Freq: Once | ORAL | Status: AC
  Administered 2013-04-18: 2 via ORAL

## 2013-04-18 MED ORDER — HYDROCODONE-ACETAMINOPHEN 5-325 MG PO TABS: ORAL_TABLET | ORAL | Status: AC

## 2013-04-18 NOTE — ED Provider Notes (Signed)
Chief Complaint   Chief Complaint  Patient presents with  . Knee Injury  . Sore Throat    History of Present Illness   Chelsey Castro is a 58 year old female who is in today for a right knee injury and a sore throat.  1. Right knee injury: This happened about a week ago. The patient lost her balance and fell, striking her right patella. She has a bruise over the patella. She is able to walk but it hurts. It hurts to flex the knee. The knee has been popping. The patient states she loses her balance frequently. There was no syncope or loss of consciousness. She did not injure herself elsewhere and there was no head injury or injury to her upper extremities or chest.  2. Sore throat: The patient has had a one-week history of sore throat, pain on swallowing, hoarseness, nasal congestion, ear pain, and nonproductive cough. She denies fever, chills, difficulty breathing, wheezing, or GI complaints. No known sick exposures.  Review of Systems   Other than as noted above, the patient denies any of the following symptoms: Systemic:  No fevers, chills, sweats, or myalgias. Eye:  No redness or discharge. ENT:  No ear pain, headache, nasal congestion, drainage, sinus pressure, or sore throat. Neck:  No neck pain, stiffness, or swollen glands. Lungs:  No cough, sputum production, hemoptysis, wheezing, chest tightness, shortness of breath or chest pain. GI:  No abdominal pain, nausea, vomiting or diarrhea.  PMFSH   Past medical history, family history, social history, meds, and allergies were reviewed. She's allergic to erythromycin, sulfa, and tramadol. She has obstructive sleep apnea, and degenerative disc disease. She has a long list of medications but states she is not taking most of these. She states the only medication that she takes regularly is Robaxin.  Physical exam   Vital signs:  BP 101/74  Pulse 102  Temp(Src) 99.3 F (37.4 C) (Oral)  Resp 24  SpO2 97% General:  Alert and  oriented.  In no distress.  Skin warm and dry. Eye:  No conjunctival injection or drainage. Lids were normal. ENT:  TMs and canals were normal, without erythema or inflammation.  Nasal mucosa was clear and uncongested, without drainage.  Mucous membranes were moist.  Pharynx was erythematous and swollen with some yellow exudate.  There were no oral ulcerations or lesions. Neck:  Supple, no adenopathy, tenderness or mass. Lungs:  No respiratory distress.  Lungs were clear to auscultation, without wheezes, rales or rhonchi.  Breath sounds were clear and equal bilaterally.  Heart:  Regular rhythm, without gallops, murmers or rubs. Extremities: Exam of the knee reveals a bruise over the patella. This was mildly tender to palpation. There was no fluid or swelling present. The knee joint had a full range of motion with pain on flexion beyond 90. There was some crepitus. Or a sign was negative, Lachman's sign was negative, and anterior drawer sign was negative. There was pain on varus and valgus stress. Skin:  Clear, warm, and dry, without rash or lesions.  Labs   Results for orders placed during the hospital encounter of 04/18/13  POCT RAPID STREP A ([facility]RG CARE ONLY)      Result Value Range   Streptococcus, Group A Screen (Direct) NEGATIVE  NEGATIVE     Radiology   Dg Knee 4 Views W/patella Right  04/18/2013   CLINICAL DATA:  Recent traumatic injury with pain  EXAM: RIGHT KNEE - COMPLETE 4+ VIEW  COMPARISON:  10/13/2012  FINDINGS: There is no evidence of fracture, dislocation, or joint effusion. There is no evidence of arthropathy or other focal bone abnormality. Soft tissues are unremarkable.  IMPRESSION: No acute abnormality noted.   Electronically Signed   By: Alcide CleverMark  Lukens M.D.   On: 04/18/2013 19:48    Course in Urgent Care Center   She was given a knee sleeve and Norco 5/325 2 for pain.  Assessment     The primary encounter diagnosis was Pharyngitis. A diagnosis of Contusion of right  knee was also pertinent to this visit.  Plan    1.  Meds:  The following meds were prescribed:   Discharge Medication List as of 04/18/2013  7:57 PM    START taking these medications   Details  amoxicillin (AMOXIL) 500 MG capsule Take 1 capsule (500 mg total) by mouth 3 (three) times daily., Starting 04/18/2013, Until Discontinued, Normal    HYDROcodone-acetaminophen (NORCO/VICODIN) 5-325 MG per tablet 1 to 2 tabs every 4 to 6 hours as needed for pain., Print        2.  Patient Education/Counseling:  The patient was given appropriate handouts, self care instructions, and instructed in symptomatic relief.  Instructed to get extra fluids, rest, and use a cool mist vaporizer.  Advised rest, ice, elevation, and should wear the knee brace when up for the next 2 weeks. Suggested she return in 2 weeks if the knee isn't better in one week if her throat isn't any better.  3.  Follow up:  The patient was told to follow up here if no better in 3 to 4 days, or sooner if becoming worse in any way, and given some red flag symptoms such as increasing fever, difficulty breathing, chest pain, or persistent vomiting which would prompt immediate return.  Follow up here as needed.      Reuben Likesavid C Cassandria Drew, MD 04/18/13 2028

## 2013-04-18 NOTE — Discharge Instructions (Signed)
RICE: Routine Care for Injuries The routine care of many injuries includes Rest, Ice, Compression, and Elevation (RICE). HOME CARE INSTRUCTIONS  Rest is needed to allow your body to heal. Routine activities can usually be resumed when comfortable. Injured tendons and bones can take up to 6 weeks to heal. Tendons are the cord-like structures that attach muscle to bone.  Ice following an injury helps keep the swelling down and reduces pain.  Put ice in a plastic bag.  Place a towel between your skin and the bag.  Leave the ice on for 15-20 minutes, 03-04 times a day. Do this while awake, for the first 24 to 48 hours. After that, continue as directed by your caregiver.  Compression helps keep swelling down. It also gives support and helps with discomfort. If an elastic bandage has been applied, it should be removed and reapplied every 3 to 4 hours. It should not be applied tightly, but firmly enough to keep swelling down. Watch fingers or toes for swelling, bluish discoloration, coldness, numbness, or excessive pain. If any of these problems occur, remove the bandage and reapply loosely. Contact your caregiver if these problems continue.  Elevation helps reduce swelling and decreases pain. With extremities, such as the arms, hands, legs, and feet, the injured area should be placed near or above the level of the heart, if possible. SEEK IMMEDIATE MEDICAL CARE IF:  You have persistent pain and swelling.  You develop redness, numbness, or unexpected weakness.  Your symptoms are getting worse rather than improving after several days. These symptoms may indicate that further evaluation or further X-rays are needed. Sometimes, X-rays may not show a small broken bone (fracture) until 1 week or 10 days later. Make a follow-up appointment with your caregiver. Ask when your X-ray results will be ready. Make sure you get your X-ray results. Document Released: 06/11/2000 Document Revised: 05/22/2011  Document Reviewed: 07/29/2010 [facility] Patient Information 2014 [place], [place].   Sore Throat A sore throat is pain, burning, irritation, or scratchiness of the throat. There is often pain or tenderness when swallowing or talking. A sore throat may be accompanied by other symptoms, such as coughing, sneezing, fever, and swollen neck glands. A sore throat is often the first sign of another sickness, such as a cold, flu, strep throat, or mononucleosis (commonly known as mono). Most sore throats go away without medical treatment. CAUSES  The most common causes of a sore throat include:  A viral infection, such as a cold, flu, or mono.  A bacterial infection, such as strep throat, tonsillitis, or whooping cough.  Seasonal allergies.  Dryness in the air.  Irritants, such as smoke or pollution.  Gastroesophageal reflux disease (GERD). HOME CARE INSTRUCTIONS   Only take over-the-counter medicines as directed by your caregiver.  Drink enough fluids to keep your urine clear or pale yellow.  Rest as needed.  Try using throat sprays, lozenges, or sucking on hard candy to ease any pain (if older than 4 years or as directed).  Sip warm liquids, such as broth, herbal tea, or warm water with honey to relieve pain temporarily. You may also eat or drink cold or frozen liquids such as frozen ice pops.  Gargle with salt water (mix 1 tsp salt with 8 oz of water).  Do not smoke and avoid secondhand smoke.  Put a cool-mist humidifier in your bedroom at night to moisten the air. You can also turn on a hot shower and sit in the bathroom with the door  closed for 5 10 minutes. SEEK IMMEDIATE MEDICAL CARE IF:  You have difficulty breathing.  You are unable to swallow fluids, soft foods, or your saliva.  You have increased swelling in the throat.  Your sore throat does not get better in 7 days.  You have nausea and vomiting.  You have a fever or persistent symptoms for more than 2 3  days.  You have a fever and your symptoms suddenly get worse. MAKE SURE YOU:   Understand these instructions.  Will watch your condition.  Will get help right away if you are not doing well or get worse. Document Released: 04/06/2004 Document Revised: 02/14/2012 Document Reviewed: 11/05/2011 [facility] Patient Information 2014 [place].

## 2013-04-18 NOTE — ED Notes (Signed)
State she fell on her R knee 1 week ago.  C/o pain and swelling medially and also has a small bruise.  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  C/o sore throat onset 1 1/2 weeks ago, with sinus congestion and bil. Earache.  No fever. Coughs occasionally that is non-productive.

## 2013-04-20 LAB — CULTURE, GROUP A STREP

## 2013-04-20 NOTE — Progress Notes (Signed)
Quick Note:  Results are abnormal as noted, but have been adequately treated. No further action necessary. ______ 

## 2013-04-21 ENCOUNTER — Telehealth (HOSPITAL_COMMUNITY): Payer: Self-pay | Admitting: *Deleted

## 2013-04-21 NOTE — ED Notes (Signed)
Throat culture: Group A Strep (S. Pyogenes).  Pt. adequately treated with Amoxicillin. I called pt. Pt. verified x 2 and given results.  Pt. told she was adequately treated and given instructions to notify anyone she exposed prior to treatment and return if not better after finishing the medication. Vassie MoselleYork, Shayda Kalka M 04/21/2013

## 2013-06-16 ENCOUNTER — Telehealth: Payer: Self-pay | Admitting: Pulmonary Disease

## 2013-06-16 NOTE — Telephone Encounter (Signed)
Pt is aware that she doesn't have to bring the whole machine with her. Advised her to bring her SD card out of the machine. Also advised that if she wasn't comfortable taking the card out, she could just bring the machine and I can show her how to do it. Nothing further was needed.

## 2013-06-17 DIAGNOSIS — F339 Major depressive disorder, recurrent, unspecified: Secondary | ICD-10-CM | POA: Insufficient documentation

## 2013-07-07 ENCOUNTER — Ambulatory Visit: Payer: Medicare PPO | Admitting: Pulmonary Disease

## 2013-12-02 DIAGNOSIS — M5416 Radiculopathy, lumbar region: Secondary | ICD-10-CM | POA: Insufficient documentation

## 2014-02-25 ENCOUNTER — Encounter (HOSPITAL_COMMUNITY): Payer: Self-pay

## 2014-02-25 ENCOUNTER — Emergency Department (HOSPITAL_COMMUNITY)
Admission: EM | Admit: 2014-02-25 | Discharge: 2014-02-25 | Disposition: A | Discharge status: Home / Self Care | Payer: Medicare PPO | Attending: Emergency Medicine | Admitting: Emergency Medicine

## 2014-02-25 ENCOUNTER — Emergency Department (HOSPITAL_COMMUNITY): Payer: Medicare PPO

## 2014-02-25 DIAGNOSIS — E119 Type 2 diabetes mellitus without complications: Secondary | ICD-10-CM | POA: Insufficient documentation

## 2014-02-25 DIAGNOSIS — F329 Major depressive disorder, single episode, unspecified: Secondary | ICD-10-CM | POA: Insufficient documentation

## 2014-02-25 DIAGNOSIS — K219 Gastro-esophageal reflux disease without esophagitis: Secondary | ICD-10-CM | POA: Diagnosis not present

## 2014-02-25 DIAGNOSIS — Z8669 Personal history of other diseases of the nervous system and sense organs: Secondary | ICD-10-CM | POA: Insufficient documentation

## 2014-02-25 DIAGNOSIS — Z792 Long term (current) use of antibiotics: Secondary | ICD-10-CM | POA: Insufficient documentation

## 2014-02-25 DIAGNOSIS — R079 Chest pain, unspecified: Secondary | ICD-10-CM | POA: Insufficient documentation

## 2014-02-25 DIAGNOSIS — Z79899 Other long term (current) drug therapy: Secondary | ICD-10-CM | POA: Diagnosis not present

## 2014-02-25 DIAGNOSIS — R52 Pain, unspecified: Secondary | ICD-10-CM | POA: Diagnosis present

## 2014-02-25 DIAGNOSIS — E785 Hyperlipidemia, unspecified: Secondary | ICD-10-CM | POA: Diagnosis not present

## 2014-02-25 DIAGNOSIS — Z720 Tobacco use: Secondary | ICD-10-CM | POA: Insufficient documentation

## 2014-02-25 DIAGNOSIS — J0110 Acute frontal sinusitis, unspecified: Secondary | ICD-10-CM | POA: Insufficient documentation

## 2014-02-25 DIAGNOSIS — R1032 Left lower quadrant pain: Secondary | ICD-10-CM | POA: Insufficient documentation

## 2014-02-25 LAB — CBC WITH DIFFERENTIAL/PLATELET
BASOS ABS: 0 10*3/uL (ref 0.0–0.1)
Basophils Relative: 0 % (ref 0–1)
EOS PCT: 0 % (ref 0–5)
Eosinophils Absolute: 0 10*3/uL (ref 0.0–0.7)
HEMATOCRIT: 39.5 % (ref 36.0–46.0)
Hemoglobin: 13 g/dL (ref 12.0–15.0)
LYMPHS PCT: 23 % (ref 12–46)
Lymphs Abs: 2.1 10*3/uL (ref 0.7–4.0)
MCH: 28.4 pg (ref 26.0–34.0)
MCHC: 32.9 g/dL (ref 30.0–36.0)
MCV: 86.4 fL (ref 78.0–100.0)
MONO ABS: 0.7 10*3/uL (ref 0.1–1.0)
Monocytes Relative: 8 % (ref 3–12)
Neutro Abs: 6.1 10*3/uL (ref 1.7–7.7)
Neutrophils Relative %: 69 % (ref 43–77)
Platelets: 317 10*3/uL (ref 150–400)
RBC: 4.57 MIL/uL (ref 3.87–5.11)
RDW: 13.1 % (ref 11.5–15.5)
WBC: 9 10*3/uL (ref 4.0–10.5)

## 2014-02-25 LAB — LIPASE, BLOOD: LIPASE: 27 U/L (ref 11–59)

## 2014-02-25 LAB — I-STAT TROPONIN, ED
TROPONIN I, POC: 0 ng/mL (ref 0.00–0.08)
Troponin i, poc: 0 ng/mL (ref 0.00–0.08)

## 2014-02-25 LAB — COMPREHENSIVE METABOLIC PANEL
ALT: 17 U/L (ref 0–35)
ANION GAP: 13 (ref 5–15)
AST: 16 U/L (ref 0–37)
Albumin: 4.6 g/dL (ref 3.5–5.2)
Alkaline Phosphatase: 97 U/L (ref 39–117)
BUN: 15 mg/dL (ref 6–23)
CALCIUM: 10.2 mg/dL (ref 8.4–10.5)
CO2: 26 meq/L (ref 19–32)
CREATININE: 0.75 mg/dL (ref 0.50–1.10)
Chloride: 98 mEq/L (ref 96–112)
GFR calc non Af Amer: >90 mL/min (ref >90)
GLUCOSE: 99 mg/dL (ref 70–99)
Potassium: 4.6 mEq/L (ref 3.7–5.3)
Sodium: 137 mEq/L (ref 137–147)
Total Bilirubin: 0.4 mg/dL (ref 0.3–1.2)
Total Protein: 7.3 g/dL (ref 6.0–8.3)

## 2014-02-25 MED ORDER — ONDANSETRON HCL 4 MG/2ML IJ SOLN
4.0000 mg | Freq: Once | INTRAMUSCULAR | Status: AC
  Administered 2014-02-25: 4 mg via INTRAVENOUS
  Filled 2014-02-25: qty 2

## 2014-02-25 MED ORDER — OMEPRAZOLE 20 MG PO CPDR: 20.0000 mg | DELAYED_RELEASE_CAPSULE | Freq: Every day | ORAL | Status: DC

## 2014-02-25 MED ORDER — GI COCKTAIL ~~LOC~~
30.0000 mL | Freq: Once | ORAL | Status: AC
  Administered 2014-02-25: 30 mL via ORAL
  Filled 2014-02-25: qty 30

## 2014-02-25 MED ORDER — MORPHINE SULFATE 4 MG/ML IJ SOLN
4.0000 mg | Freq: Once | INTRAMUSCULAR | Status: AC
  Administered 2014-02-25: 4 mg via INTRAVENOUS
  Filled 2014-02-25: qty 1

## 2014-02-25 MED ORDER — AMOXICILLIN-POT CLAVULANATE 875-125 MG PO TABS: 1.0000 | ORAL_TABLET | Freq: Two times a day (BID) | ORAL | Status: DC

## 2014-02-25 NOTE — ED Notes (Addendum)
Per ems - flulike symptoms began yesterday. Pt reports fever, nausea, abdominal pains, non-productive cough. Pt has pain with cough. Lung sounds clear, SpO2 100%, 12-lead EKG NSR. Given 4mg  zofran with some relief. Pt reports worsening of abdominal pain during transport, pain 10/10 with cramping that changes with palpation. Pt had taken Tylenol before EMS arrived.

## 2014-02-25 NOTE — ED Notes (Signed)
I stat troponin results 0.00 ng/mL

## 2014-02-25 NOTE — ED Provider Notes (Signed)
Unable to upload EKG in MUSE  Date: 02/25/2014  Rate: 88  Rhythm: NSR   QRS Axis: normal axis    Intervals: normal intervals  ST/T Wave abnormalities: no ST changes  Conduction Disutrbances: none  Narrative Interpretation:    Medical screening examination/treatment/procedure(s) were conducted as a shared visit with non-physician practitioner(s) and myself.  I personally evaluated the patient during the encounter.   EKG Interpretation None      Patient visit shared. Patient seen and examined by myself. She presents with abdominal pain, episode of emesis at home. She has nonspecific chest pain. Clinical picture is not consistent with PE, ACS, dissection, subarachnoid hemorrhage. The patient does have mild left lower quadrant tenderness without guarding or rebound tenderness, has a history of diverticulitis with symptoms today feeling similar to prior episode. We'll treat for mild diverticulitis with close return precautions. Do not feel that CT scan is indicated at this time.  Tilden FossaElizabeth Hollis Oh, MD 02/26/14 [phone]

## 2014-02-25 NOTE — ED Notes (Signed)
Patient transported to X-ray 

## 2014-02-25 NOTE — ED Provider Notes (Signed)
CSN: [id number]     Arrival date & time 02/25/14  1707 History   First MD Initiated Contact with Patient 02/25/14 1708     Chief Complaint  Patient presents with  . Generalized Body Aches     (Consider location/radiation/quality/duration/timing/severity/associated sxs/prior Treatment) HPI Comments: Patient presents today with several complaints.  She reports that she has had abdominal pain for the past 3 days.  Pain is diffuse and does not radiate.  Pain is gradually worsening.  She has associated nausea, but no vomiting.  She denies urinary symptoms, fever, chills, diarrhea, vaginal discharge, or constipation.  She also reports that she has had a headache for the past week.  Headache is a frontal headache.  Headache associated with nasal congestion.  She states that it feels similar to pain that she has had with a sinus infection in the past.  She has not taken anything for the pain.  She also reports that she has had constant left anterior chest pain since 3 PM today.  Chest pain does not radiate.  Pain is not pleuritic.  She has not taken anything for symptoms.  She also reports that she has had a subjective fever over the past couple of days.  No documented fever.  She reports associated cough and nasal congestion.  She denies numbness, tingling, lower extremity edema, dizziness, syncope, or diaphoresis.  She does state that she had a Pulmonary Embolism in 2006.  She was on anticoagulants for 6 months following the PE, but is currently not on anticoagulants.  She states that at the time of her PE she was on Estrogen hormone replacement therapy, which was thought to be the cause of the PE.  She reports that the chest pain does not feel similar to the pain that she had at that time.  She denies prior cardiac history.  She states that she was seen by her PCP yesterday for the same, but was not given a specific diagnosis.  Her PCP performed a UA, which she states was negative.  He then sent her Urine for  culture.    The history is provided by the patient.    Past Medical History  Diagnosis Date  . Diabetes   . Hyperlipemia   . OSA (obstructive sleep apnea)   . Depression   . GERD (gastroesophageal reflux disease)    Past Surgical History  Procedure Laterality Date  . Gallbladder surgery    . Appenedectomy    . Hysterectomy    . Back surgery    . Breast lumpectomy      bilateral  . Knee surgery    . Ankle surgery     Family History  Problem Relation Age of Onset  . Diabetes Father   . High Cholesterol Father   . Heart disease Father   . Atrial fibrillation Father   . Cancer Sister   . Heart failure Sister   . Atrial fibrillation Mother   . Alzheimer's disease Mother    History  Substance Use Topics  . Smoking status: Current Every Day Smoker -- 0.10 packs/day for 20 years    Types: Cigarettes  . Smokeless tobacco: Not on file  . Alcohol Use: No   OB History    No data available     Review of Systems  All other systems reviewed and are negative.     Allergies  Erythromycin; Sulfa antibiotics; and Tramadol  Home Medications   Prior to Admission medications   Medication Sig Start Date  End Date Taking? Authorizing Provider  amoxicillin (AMOXIL) 500 MG capsule Take 1 capsule (500 mg total) by mouth 3 (three) times daily. 04/18/13   Reuben Likesavid C Keller, MD  amphetamine-dextroamphetamine (ADDERALL) 20 MG tablet Take 20 mg by mouth 2 (two) times daily.    Historical Provider, MD  ARIPiprazole (ABILIFY) 10 MG tablet Take 10 mg by mouth daily.    Historical Provider, MD  buPROPion (WELLBUTRIN SR) 150 MG 12 hr tablet Take 150 mg by mouth 2 (two) times daily.    Historical Provider, MD  dexlansoprazole (DEXILANT) 60 MG capsule Take 60 mg by mouth daily.    Historical Provider, MD  DULOXETINE HCL PO Take 60 mg by mouth 2 (two) times daily.    Historical Provider, MD  gabapentin (NEURONTIN) 600 MG tablet Take 300 mg by mouth 3 (three) times daily as needed.     Historical  Provider, MD  HYDROcodone-acetaminophen (NORCO/VICODIN) 5-325 MG per tablet 1 to 2 tabs every 4 to 6 hours as needed for pain. 04/18/13   Reuben Likesavid C Keller, MD  lamoTRIgine (LAMICTAL) 25 MG tablet Take 50 mg by mouth at bedtime.    Historical Provider, MD  Linaclotide Karlene Einstein(LINZESS) 145 MCG CAPS Take 145 mcg by mouth daily.    Historical Provider, MD  megestrol (MEGACE) 20 MG tablet Take 20 mg by mouth 2 (two) times daily.    Historical Provider, MD  metaxalone (SKELAXIN) 800 MG tablet Take 800 mg by mouth 3 (three) times daily as needed for pain.    Historical Provider, MD  methocarbamol (ROBAXIN) 750 MG tablet Take 750 mg by mouth every 4 (four) hours.    Historical Provider, MD  omeprazole (PRILOSEC) 40 MG capsule Take 40 mg by mouth daily.    Historical Provider, MD  ondansetron (ZOFRAN) 4 MG tablet Take 4 mg by mouth every 8 (eight) hours as needed for nausea.    Historical Provider, MD  rizatriptan (MAXALT) 10 MG tablet Take 10 mg by mouth as needed for migraine. May repeat in 2 hours if needed    Historical Provider, MD  simvastatin (ZOCOR) 20 MG tablet Take 20 mg by mouth every evening.    Historical Provider, MD  trimethoprim (TRIMPEX) 100 MG tablet Take 100 mg by mouth daily.    Historical Provider, MD  ziprasidone (GEODON) 40 MG capsule Take 40 mg by mouth at bedtime.    Historical Provider, MD  zolpidem (AMBIEN) 5 MG tablet Take 10 mg by mouth at bedtime as needed for sleep. 05/07/12   Coralyn HellingVineet Sood, MD   BP 142/80 mmHg  Pulse 94  Temp(Src) 98 F (36.7 C) (Oral)  Resp 16  SpO2 100% Physical Exam  Constitutional: She appears well-developed and well-nourished.  HENT:  Head: Normocephalic and atraumatic.  Mouth/Throat: Oropharynx is clear and moist.  Eyes: EOM are normal. Pupils are equal, round, and reactive to light.  Neck: Normal range of motion. Neck supple.  Cardiovascular: Normal rate, regular rhythm and normal heart sounds.   Pulses:      Dorsalis pedis pulses are 2+ on the right  side, and 2+ on the left side.  Pulmonary/Chest: Effort normal and breath sounds normal. No respiratory distress. She has no wheezes. She has no rales.  Abdominal: Soft. Bowel sounds are normal. She exhibits no distension and no mass. There is generalized tenderness. There is no rebound and no guarding.  Musculoskeletal: Normal range of motion.  No lower extremity edema or erythema bilaterally  Neurological: She is alert.  Skin:  Skin is warm and dry.  Psychiatric: She has a normal mood and affect.  Nursing note and vitals reviewed.   ED Course  Procedures (including critical care time) Labs Review Labs Reviewed  CBC WITH DIFFERENTIAL  COMPREHENSIVE METABOLIC PANEL  LIPASE, BLOOD  I-STAT TROPOININ, ED    Imaging Review No results found.   EKG Interpretation   Date/Time:  Wednesday February 25 2014 17:15:04 EST Ventricular Rate:  88 PR Interval:  158 QRS Duration: 88 QT Interval:  381 QTC Calculation: 461 R Axis:   38 Text Interpretation:  Sinus rhythm Baseline wander in lead(s) II III aVF  V6 ED PHYSICIAN INTERPRETATION AVAILABLE IN [facility] Confirmed by  TEST, Record ([id number]) on 02/27/2014 9:28:22 AM     9:02 PM Reassessed patient.  She reports that her pain has improved at this time. MDM   Final diagnoses:  Chest pain   Patient presents today with several complaints.  She is complaining of headache, abdominal pain, and chest pain.  Patient afebrile.  Non toxic appearing.  VSS.  Headache most consistent with Acute Sinusitis.  Pt HA treated and improved while in ED.  Headache gradual in onset.  Presentation is non concerning for [facility], ICH, Meningitis, or temporal arteritis. Pt is afebrile with no focal neuro deficits, nuchal rigidity, or change in vision.   She is also complaining of abdominal pain.  No rebound or guarding on exam.  Labs unremarkable.  However, on repeat abdominal exam she is tender in the LLQ.  She reports history of Diverticulitis.  Therefore,  patient started on Augmentin to treat for possible Diverticulitis.  Augmentin will also treat Acute Sinusitis.  Therefore, do not feel that any abdominal imaging is indicated at this time.  Patient also complaining of chest pain.  Chest pain is atypical.  EKG unremarkable.  Troponin negative.  CXR negative.  Presentation not consistent with PE.  Pain improved with GI cocktail.  Feel that the patient is stable for discharge.  Return precautions given.  Patient also evaluated by Dr. Madilyn Hookees who is in agreement with the plan.       Santiago GladHeather Mccoy Testa, PA-C 02/28/14 2153  Santiago GladHeather Aletheia Tangredi, PA-C 02/28/14 2156  Tilden FossaElizabeth Rees, MD 03/02/14 [phone]

## 2014-02-27 ENCOUNTER — Emergency Department (HOSPITAL_COMMUNITY)
Admission: EM | Admit: 2014-02-27 | Discharge: 2014-02-27 | Disposition: A | Discharge status: Home / Self Care | Payer: Medicare PPO | Attending: Emergency Medicine | Admitting: Emergency Medicine

## 2014-02-27 ENCOUNTER — Encounter (HOSPITAL_COMMUNITY): Payer: Self-pay | Admitting: Emergency Medicine

## 2014-02-27 ENCOUNTER — Emergency Department (HOSPITAL_COMMUNITY): Payer: Medicare PPO

## 2014-02-27 DIAGNOSIS — K219 Gastro-esophageal reflux disease without esophagitis: Secondary | ICD-10-CM | POA: Insufficient documentation

## 2014-02-27 DIAGNOSIS — F329 Major depressive disorder, single episode, unspecified: Secondary | ICD-10-CM | POA: Insufficient documentation

## 2014-02-27 DIAGNOSIS — Z90710 Acquired absence of both cervix and uterus: Secondary | ICD-10-CM | POA: Diagnosis not present

## 2014-02-27 DIAGNOSIS — R109 Unspecified abdominal pain: Secondary | ICD-10-CM

## 2014-02-27 DIAGNOSIS — E785 Hyperlipidemia, unspecified: Secondary | ICD-10-CM | POA: Diagnosis not present

## 2014-02-27 DIAGNOSIS — R509 Fever, unspecified: Secondary | ICD-10-CM | POA: Insufficient documentation

## 2014-02-27 DIAGNOSIS — Z9089 Acquired absence of other organs: Secondary | ICD-10-CM | POA: Diagnosis not present

## 2014-02-27 DIAGNOSIS — R1012 Left upper quadrant pain: Secondary | ICD-10-CM | POA: Insufficient documentation

## 2014-02-27 DIAGNOSIS — R197 Diarrhea, unspecified: Secondary | ICD-10-CM | POA: Diagnosis not present

## 2014-02-27 DIAGNOSIS — Z8669 Personal history of other diseases of the nervous system and sense organs: Secondary | ICD-10-CM | POA: Diagnosis not present

## 2014-02-27 DIAGNOSIS — Z720 Tobacco use: Secondary | ICD-10-CM | POA: Insufficient documentation

## 2014-02-27 DIAGNOSIS — Z792 Long term (current) use of antibiotics: Secondary | ICD-10-CM | POA: Diagnosis not present

## 2014-02-27 DIAGNOSIS — E119 Type 2 diabetes mellitus without complications: Secondary | ICD-10-CM | POA: Insufficient documentation

## 2014-02-27 DIAGNOSIS — R112 Nausea with vomiting, unspecified: Secondary | ICD-10-CM | POA: Diagnosis not present

## 2014-02-27 DIAGNOSIS — R1013 Epigastric pain: Secondary | ICD-10-CM | POA: Insufficient documentation

## 2014-02-27 DIAGNOSIS — Z79818 Long term (current) use of other agents affecting estrogen receptors and estrogen levels: Secondary | ICD-10-CM | POA: Diagnosis not present

## 2014-02-27 DIAGNOSIS — Z79899 Other long term (current) drug therapy: Secondary | ICD-10-CM | POA: Insufficient documentation

## 2014-02-27 LAB — CBC WITH DIFFERENTIAL/PLATELET
Basophils Absolute: 0 10*3/uL (ref 0.0–0.1)
Basophils Relative: 0 % (ref 0–1)
Eosinophils Absolute: 0.1 10*3/uL (ref 0.0–0.7)
Eosinophils Relative: 1 % (ref 0–5)
HCT: 40.2 % (ref 36.0–46.0)
HEMOGLOBIN: 13.3 g/dL (ref 12.0–15.0)
LYMPHS ABS: 0.9 10*3/uL (ref 0.7–4.0)
Lymphocytes Relative: 10 % — ABNORMAL LOW (ref 12–46)
MCH: 28.9 pg (ref 26.0–34.0)
MCHC: 33.1 g/dL (ref 30.0–36.0)
MCV: 87.2 fL (ref 78.0–100.0)
MONOS PCT: 7 % (ref 3–12)
Monocytes Absolute: 0.6 10*3/uL (ref 0.1–1.0)
NEUTROS ABS: 7.3 10*3/uL (ref 1.7–7.7)
Neutrophils Relative %: 82 % — ABNORMAL HIGH (ref 43–77)
Platelets: 320 10*3/uL (ref 150–400)
RBC: 4.61 MIL/uL (ref 3.87–5.11)
RDW: 13 % (ref 11.5–15.5)
WBC: 8.8 10*3/uL (ref 4.0–10.5)

## 2014-02-27 LAB — COMPREHENSIVE METABOLIC PANEL
ALK PHOS: 92 U/L (ref 39–117)
ALT: 15 U/L (ref 0–35)
AST: 16 U/L (ref 0–37)
Albumin: 4.1 g/dL (ref 3.5–5.2)
Anion gap: 18 — ABNORMAL HIGH (ref 5–15)
BILIRUBIN TOTAL: 0.3 mg/dL (ref 0.3–1.2)
BUN: 14 mg/dL (ref 6–23)
CHLORIDE: 103 meq/L (ref 96–112)
CO2: 21 meq/L (ref 19–32)
Calcium: 9.2 mg/dL (ref 8.4–10.5)
Creatinine, Ser: 0.65 mg/dL (ref 0.50–1.10)
GFR calc non Af Amer: >90 mL/min (ref >90)
GLUCOSE: 101 mg/dL — AB (ref 70–99)
POTASSIUM: 3.6 meq/L — AB (ref 3.7–5.3)
Sodium: 142 mEq/L (ref 137–147)
Total Protein: 6.6 g/dL (ref 6.0–8.3)

## 2014-02-27 LAB — URINALYSIS, ROUTINE W REFLEX MICROSCOPIC
BILIRUBIN URINE: NEGATIVE
Glucose, UA: NEGATIVE mg/dL
HGB URINE DIPSTICK: NEGATIVE
Ketones, ur: >80 mg/dL — AB
Leukocytes, UA: NEGATIVE
Nitrite: NEGATIVE
PROTEIN: 30 mg/dL — AB
SPECIFIC GRAVITY, URINE: 1.044 — AB (ref 1.005–1.030)
UROBILINOGEN UA: 0.2 mg/dL (ref 0.0–1.0)
pH: 5.5 (ref 5.0–8.0)

## 2014-02-27 LAB — URINE MICROSCOPIC-ADD ON

## 2014-02-27 LAB — LIPASE, BLOOD: Lipase: 33 U/L (ref 11–59)

## 2014-02-27 MED ORDER — OXYCODONE-ACETAMINOPHEN 5-325 MG PO TABS
1.0000 | ORAL_TABLET | Freq: Once | ORAL | Status: AC
  Administered 2014-02-27: 1 via ORAL
  Filled 2014-02-27: qty 1

## 2014-02-27 MED ORDER — SODIUM CHLORIDE 0.9 % IV BOLUS (SEPSIS)
1000.0000 mL | Freq: Once | INTRAVENOUS | Status: AC
  Administered 2014-02-27: 1000 mL via INTRAVENOUS

## 2014-02-27 MED ORDER — IOHEXOL 300 MG/ML  SOLN
50.0000 mL | Freq: Once | INTRAMUSCULAR | Status: AC | PRN
  Administered 2014-02-27: 50 mL via ORAL

## 2014-02-27 MED ORDER — DIPHENOXYLATE-ATROPINE 2.5-0.025 MG PO TABS
2.0000 | ORAL_TABLET | Freq: Once | ORAL | Status: AC
  Administered 2014-02-27: 2 via ORAL
  Filled 2014-02-27: qty 2

## 2014-02-27 MED ORDER — DIPHENOXYLATE-ATROPINE 2.5-0.025 MG PO TABS: 2.0000 | ORAL_TABLET | Freq: Four times a day (QID) | ORAL | Status: AC | PRN

## 2014-02-27 MED ORDER — IOHEXOL 300 MG/ML  SOLN
100.0000 mL | Freq: Once | INTRAMUSCULAR | Status: AC | PRN
  Administered 2014-02-27: 100 mL via INTRAVENOUS

## 2014-02-27 MED ORDER — HYDROMORPHONE HCL 1 MG/ML IJ SOLN
1.0000 mg | Freq: Once | INTRAMUSCULAR | Status: AC
  Administered 2014-02-27: 1 mg via INTRAVENOUS
  Filled 2014-02-27: qty 1

## 2014-02-27 MED ORDER — ONDANSETRON 4 MG PO TBDP: 4.0000 mg | ORAL_TABLET | Freq: Three times a day (TID) | ORAL | Status: DC | PRN

## 2014-02-27 NOTE — ED Notes (Signed)
Pt tolerating PO fluids

## 2014-02-27 NOTE — Discharge Instructions (Signed)
Please read and follow all provided instructions.  Your diagnoses today include:  1. Diarrhea   2. Abdominal pain   3. Non-intractable vomiting with nausea, vomiting of unspecified type     Tests performed today include:  Blood counts and electrolytes  Blood tests to check liver and kidney function  Blood tests to check pancreas function  Urine test to look for infection - shows dehydration  CT scan - does not show any significant abdominal infections or other problems  Vital signs. See below for your results today.   Medications prescribed:   Lomotil - medication for diarrhea   Zofran (ondansetron) - for nausea and vomiting  Take any prescribed medications only as directed.  Home care instructions:   Follow any educational materials contained in this packet.   Stop taking the Augmentin since we do not see any abdominal infections on CT scan  You should rest for the next several days. Keep drinking plenty of fluids and use the medicine for nausea as directed.    Drink clear liquids for the next 24 hours and introduce solid foods slowly after 24 hours using the b.r.a.t. diet (Bananas, Rice, Applesauce, Toast, Yogurt).    Follow-up instructions: Please follow-up with your primary care provider in the next 3 days for further evaluation of your symptoms. If you are not feeling better in 48 hours you may have a condition that is more serious and you need re-evaluation.   Return instructions:  SEEK IMMEDIATE MEDICAL ATTENTION IF:  If you have pain that does not go away or becomes severe   A temperature above 101F develops   Repeated vomiting occurs (multiple episodes)   If you have pain that becomes localized to portions of the abdomen. The right side could possibly be appendicitis. In an adult, the left lower portion of the abdomen could be colitis or diverticulitis.   Blood is being passed in stools or vomit (bright red or black tarry stools)   You develop chest  pain, difficulty breathing, dizziness or fainting, or become confused, poorly responsive, or inconsolable (young children)  If you have any other emergent concerns regarding your health  Additional Information: Abdominal (belly) pain can be caused by many things. Your caregiver performed an examination and possibly ordered blood/urine tests and imaging (CT scan, x-rays, ultrasound). Many cases can be observed and treated at home after initial evaluation in the emergency department. Even though you are being discharged home, abdominal pain can be unpredictable. Therefore, you need a repeated exam if your pain does not resolve, returns, or worsens. Most patients with abdominal pain don't have to be admitted to the hospital or have surgery, but serious problems like appendicitis and gallbladder attacks can start out as nonspecific pain. Many abdominal conditions cannot be diagnosed in one visit, so follow-up evaluations are very important.  Your vital signs today were: BP 127/62 mmHg   Pulse 78   Temp(Src) 97.6 F (36.4 C) (Oral)   Resp 16   SpO2 97% If your blood pressure (bp) was elevated above 135/85 this visit, please have this repeated by your doctor within one month. --------------

## 2014-02-27 NOTE — ED Notes (Signed)
Patient transported to CT 

## 2014-02-27 NOTE — ED Notes (Signed)
Bed: [id number] Expected date:  Expected time:  Means of arrival:  Comments: EMS-N/V/D-fever

## 2014-02-27 NOTE — ED Provider Notes (Signed)
CSN: [id number]     Arrival date & time 02/27/14  1435 History   First MD Initiated Contact with Patient 02/27/14 1500     Chief Complaint  Patient presents with  . Emesis  . Diarrhea  . Fever  . Chills     (Consider location/radiation/quality/duration/timing/severity/associated sxs/prior Treatment) HPI Comments: Patient with history of cholecystectomy, appendectomy, fundoplication, hysterectomy, diverticulitis, PE -- presents with c/o abdominal pain, N/V/D, subjective fever x 5 days. Patient was seen in ED 2 days ago and was discharged home on Augmentin for presumed diverticulitis. At that time she had normal CBC, CMP, lipase, troponin x 2. She does not appear to have urine studies performed. She was told to return if symptoms worsened and she presents today with chills, subjective fever, continued pain, dry heaves, multiple episodes of watery diarrhea. No blood in stool. No recent travel or abx prior to Augmentin. No CP or SOB although she does continue to have some burning pain in her chest she states is similar to previous reflux. No urinary sx. She states this feels different than previous episode of diverticulitis in severity and in character. EMS transported to hospital and gave zofran, fluids.   Patient is a 58 y.o. female presenting with vomiting, diarrhea, and fever. The history is provided by the patient and medical records.  Emesis Associated symptoms: abdominal pain and diarrhea   Associated symptoms: no headaches, no myalgias and no sore throat   Diarrhea Associated symptoms: abdominal pain, fever (subjective) and vomiting   Associated symptoms: no headaches and no myalgias   Fever Associated symptoms: diarrhea, nausea and vomiting   Associated symptoms: no chest pain, no cough, no dysuria, no headaches, no myalgias, no rash, no rhinorrhea and no sore throat     Past Medical History  Diagnosis Date  . Diabetes   . Hyperlipemia   . OSA (obstructive sleep apnea)   .  Depression   . GERD (gastroesophageal reflux disease)    Past Surgical History  Procedure Laterality Date  . Gallbladder surgery    . Appenedectomy    . Hysterectomy    . Back surgery    . Breast lumpectomy      bilateral  . Knee surgery    . Ankle surgery     Family History  Problem Relation Age of Onset  . Diabetes Father   . High Cholesterol Father   . Heart disease Father   . Atrial fibrillation Father   . Cancer Sister   . Heart failure Sister   . Atrial fibrillation Mother   . Alzheimer's disease Mother    History  Substance Use Topics  . Smoking status: Current Every Day Smoker -- 0.10 packs/day for 20 years    Types: Cigarettes  . Smokeless tobacco: Not on file  . Alcohol Use: No   OB History    No data available     Review of Systems  Constitutional: Positive for fever (subjective).  HENT: Negative for rhinorrhea and sore throat.   Eyes: Negative for redness.  Respiratory: Negative for cough and shortness of breath.   Cardiovascular: Negative for chest pain.  Gastrointestinal: Positive for nausea, vomiting, abdominal pain and diarrhea. Negative for blood in stool.  Genitourinary: Negative for dysuria.  Musculoskeletal: Negative for myalgias.  Skin: Negative for rash.  Neurological: Negative for headaches.    Allergies  Bee venom; Erythromycin; Sulfa antibiotics; and Tramadol  Home Medications   Prior to Admission medications   Medication Sig Start Date End Date Taking?  Authorizing Provider  amoxicillin (AMOXIL) 500 MG capsule Take 1 capsule (500 mg total) by mouth 3 (three) times daily. 04/18/13   Reuben Likesavid C Keller, MD  amoxicillin-clavulanate (AUGMENTIN) 875-125 MG per tablet Take 1 tablet by mouth 2 (two) times daily. 02/25/14   Heather Laisure, PA-C  amphetamine-dextroamphetamine (ADDERALL XR) 30 MG 24 hr capsule Take 30 mg by mouth daily. 02/22/14   Historical Provider, MD  ARIPiprazole (ABILIFY) 10 MG tablet Take 10 mg by mouth daily.    Historical  Provider, MD  clorazepate (TRANXENE) 3.75 MG tablet Take 3.75 mg by mouth as needed for anxiety.  01/21/14   Historical Provider, MD  dexlansoprazole (DEXILANT) 60 MG capsule Take 60 mg by mouth every morning.     Historical Provider, MD  DULoxetine (CYMBALTA) 60 MG capsule Take 60 mg by mouth daily. 02/16/14   Historical Provider, MD  EPIPEN 2-PAK 0.3 MG/0.3ML SOAJ injection Inject 0.3 mg into the muscle once. for allergic reaction 11/18/13   Historical Provider, MD  famotidine (PEPCID) 40 MG tablet Take 40 mg by mouth at bedtime. 01/29/14   Historical Provider, MD  gabapentin (NEURONTIN) 600 MG tablet Take 300 mg by mouth 3 (three) times daily.     Historical Provider, MD  HYDROcodone-acetaminophen (NORCO/VICODIN) 5-325 MG per tablet 1 to 2 tabs every 4 to 6 hours as needed for pain. 04/18/13   Reuben Likesavid C Keller, MD  lamoTRIgine (LAMICTAL) 100 MG tablet Take 100 mg by mouth at bedtime. 02/18/14   Historical Provider, MD  Linaclotide (LINZESS) 145 MCG CAPS Take 145 mcg by mouth daily.    Historical Provider, MD  megestrol (MEGACE) 20 MG tablet Take 20 mg by mouth 2 (two) times daily.    Historical Provider, MD  methocarbamol (ROBAXIN) 750 MG tablet Take 750 mg by mouth every 4 (four) hours as needed for muscle spasms.  01/29/14   Historical Provider, MD  morphine (MS CONTIN) 15 MG 12 hr tablet Take 15 mg by mouth every 12 (twelve) hours. 01/21/14   Historical Provider, MD  MYRBETRIQ 50 MG TB24 tablet Take 50 mg by mouth daily. 02/10/14   Historical Provider, MD  omeprazole (PRILOSEC) 20 MG capsule Take 1 capsule (20 mg total) by mouth daily. 02/25/14   Heather Laisure, PA-C  PROAIR HFA 108 (90 BASE) MCG/ACT inhaler Inhale 2 puffs into the lungs every 6 (six) hours as needed for wheezing or shortness of breath.  12/09/13   Historical Provider, MD  simvastatin (ZOCOR) 20 MG tablet Take 20 mg by mouth every evening.    Historical Provider, MD  trimethoprim (TRIMPEX) 100 MG tablet Take 100 mg by mouth daily.     Historical Provider, MD  ziprasidone (GEODON) 40 MG capsule Take 40 mg by mouth at bedtime.    Historical Provider, MD  zolpidem (AMBIEN) 5 MG tablet Take 10 mg by mouth at bedtime.  05/07/12   Coralyn HellingVineet Sood, MD   BP 127/82 mmHg  Pulse 88  Temp(Src) 97.6 F (36.4 C) (Oral)  Resp 16  SpO2 98%   Physical Exam  Constitutional: She appears well-developed and well-nourished.  HENT:  Head: Normocephalic and atraumatic.  Eyes: Conjunctivae are normal. Right eye exhibits no discharge. Left eye exhibits no discharge.  Neck: Normal range of motion. Neck supple.  Cardiovascular: Normal rate, regular rhythm and normal heart sounds.   No murmur heard. Pulmonary/Chest: Effort normal and breath sounds normal. No respiratory distress. She has no wheezes. She has no rales.  Abdominal: Soft. She exhibits no distension.  There is tenderness. There is no rebound and no guarding.  Moderate tenderness LUQ, epigastrium, suprapubic area  Neurological: She is alert.  Skin: Skin is warm and dry.  Psychiatric: She has a normal mood and affect.  Nursing note and vitals reviewed.   ED Course  Procedures (including critical care time) Labs Review Labs Reviewed  CBC WITH DIFFERENTIAL - Abnormal; Notable for the following:    Neutrophils Relative % 82 (*)    Lymphocytes Relative 10 (*)    All other components within normal limits  COMPREHENSIVE METABOLIC PANEL - Abnormal; Notable for the following:    Potassium 3.6 (*)    Glucose, Bld 101 (*)    Anion gap 18 (*)    All other components within normal limits  URINALYSIS, ROUTINE W REFLEX MICROSCOPIC - Abnormal; Notable for the following:    Specific Gravity, Urine 1.044 (*)    Ketones, ur >80 (*)    Protein, ur 30 (*)    All other components within normal limits  LIPASE, BLOOD  URINE MICROSCOPIC-ADD ON    Imaging Review Ct Abdomen Pelvis W Contrast  02/27/2014   CLINICAL DATA:  Right lower quadrant pain, diarrhea are might  EXAM: CT ABDOMEN AND PELVIS  WITH CONTRAST  TECHNIQUE: Multidetector CT imaging of the abdomen and pelvis was performed using the standard protocol following bolus administration of intravenous contrast.  CONTRAST:  50mL OMNIPAQUE IOHEXOL 300 MG/ML SOLN, OMNIPAQUE IOHEXOL 300 MG/ML SOLN  COMPARISON:  01/30/2013  FINDINGS: The lung bases are clear.  There is a 5 mm hypodensity in the right hepatic lobe likely representing a small cyst or hematoma. There is no intrahepatic or extrahepatic biliary ductal dilatation. The gallbladder is surgically absent. The spleen demonstrates no focal abnormality. The kidneys, adrenal glands and pancreas are normal. The bladder is unremarkable.  The stomach, duodenum, small intestine, and large intestine demonstrate no contrast extravasation or dilatation. There is fluid in the transverse and descending colon as can be seen with diarrhea. There is no pneumoperitoneum, pneumatosis, or portal venous gas. There is no abdominal or pelvic free fluid. There is no lymphadenopathy.  The abdominal aorta is normal in caliber with atherosclerosis.  There are no lytic or sclerotic osseous lesions. There is posterior lumbar fusion at L5-S1.  IMPRESSION: 1. Fluid in the transverse and descending colon as can be seen with diarrhea. No bowel wall thickening or surrounding inflammatory changes to suggest colitis. 2. No bowel obstruction.   Electronically Signed   By: Elige Ko   On: 02/27/2014 17:23     EKG Interpretation None       3:12 PM Patient seen and examined. Work-up initiated. Medications ordered. Will proceed with CT as symptoms not improved with antibiotic therapy.   Vital signs reviewed and are as follows: BP 127/82 mmHg  Pulse 88  Temp(Src) 97.6 F (36.4 C) (Oral)  Resp 16  SpO2 98%  7:43 PM Patient has done well in ED. No vomiting. She is tolerating PO's. No frequent diarrhea.   CT does not indicate any significant abd infection.   Patient discussed with Dr. Romeo Apple.   Will d/c to  home with Lomotil and zofran. Patient is to discontinue the Augmentin due to no abdominal infections seen on CT exam.  Patient counseled on clear liquids and brat diet.  Prior to discharge, I ensured that the patient did not suddenly discontinue her chronic pain medications. She states that she has these at home and is continuing to take these.  The patient was urged to return to the Emergency Department immediately with worsening of current symptoms, worsening abdominal pain, persistent vomiting, blood noted in stools, fever, or any other concerns. The patient verbalized understanding.    MDM   Final diagnoses:  Abdominal pain   Patient with diarrhea, neg CT scan tonight with reassuring labs. Vitals are stable, no fever. Dehydration treated with 2L NS + oral fluids. Pt tolerating PO's. Lungs are clear. No focal abdominal pain, no concern for appendicitis, cholecystitis, pancreatitis, ruptured viscus, UTI, kidney stone, or any other abdominal etiology. Supportive therapy indicated with return if symptoms worsen. Patient counseled.     Renne CriglerJoshua Kosisochukwu Goldberg, PA-C 02/27/14 1954  Renne CriglerJoshua Oley Lahaie, PA-C 02/27/14 1954  Purvis SheffieldForrest Harrison, MD 02/28/14 [phone]

## 2014-02-27 NOTE — ED Notes (Signed)
Per EMS, Pt from home c/o N/V/D, fever and chills. Pt dx with intestinal infection on Wednesday. Pt sts abdominal cramping has gotten worse. Pt sts she had diarrhea all night. Pt was clammy and diaphoretic for EMS. Pt has had 4 mg zofran, 1000mg  Tylenol and 450 mL NS per EMS. A&Ox4.

## 2014-09-14 ENCOUNTER — Encounter (HOSPITAL_COMMUNITY): Payer: Self-pay | Admitting: Family Medicine

## 2014-09-14 ENCOUNTER — Emergency Department (HOSPITAL_COMMUNITY): Payer: Medicare HMO

## 2014-09-14 ENCOUNTER — Emergency Department (HOSPITAL_COMMUNITY)
Admission: EM | Admit: 2014-09-14 | Discharge: 2014-09-14 | Disposition: A | Discharge status: Home / Self Care | Payer: Medicare HMO | Attending: Emergency Medicine | Admitting: Emergency Medicine

## 2014-09-14 DIAGNOSIS — S3992XA Unspecified injury of lower back, initial encounter: Secondary | ICD-10-CM | POA: Diagnosis not present

## 2014-09-14 DIAGNOSIS — Z792 Long term (current) use of antibiotics: Secondary | ICD-10-CM | POA: Diagnosis not present

## 2014-09-14 DIAGNOSIS — G8929 Other chronic pain: Secondary | ICD-10-CM | POA: Insufficient documentation

## 2014-09-14 DIAGNOSIS — S199XXA Unspecified injury of neck, initial encounter: Secondary | ICD-10-CM | POA: Diagnosis not present

## 2014-09-14 DIAGNOSIS — Y939 Activity, unspecified: Secondary | ICD-10-CM | POA: Insufficient documentation

## 2014-09-14 DIAGNOSIS — Z720 Tobacco use: Secondary | ICD-10-CM | POA: Diagnosis not present

## 2014-09-14 DIAGNOSIS — W010XXA Fall on same level from slipping, tripping and stumbling without subsequent striking against object, initial encounter: Secondary | ICD-10-CM | POA: Diagnosis not present

## 2014-09-14 DIAGNOSIS — M545 Low back pain: Secondary | ICD-10-CM

## 2014-09-14 DIAGNOSIS — Y929 Unspecified place or not applicable: Secondary | ICD-10-CM | POA: Insufficient documentation

## 2014-09-14 DIAGNOSIS — Y999 Unspecified external cause status: Secondary | ICD-10-CM | POA: Insufficient documentation

## 2014-09-14 DIAGNOSIS — F329 Major depressive disorder, single episode, unspecified: Secondary | ICD-10-CM | POA: Diagnosis not present

## 2014-09-14 DIAGNOSIS — E785 Hyperlipidemia, unspecified: Secondary | ICD-10-CM | POA: Diagnosis not present

## 2014-09-14 DIAGNOSIS — M542 Cervicalgia: Secondary | ICD-10-CM

## 2014-09-14 DIAGNOSIS — K219 Gastro-esophageal reflux disease without esophagitis: Secondary | ICD-10-CM | POA: Insufficient documentation

## 2014-09-14 DIAGNOSIS — E119 Type 2 diabetes mellitus without complications: Secondary | ICD-10-CM | POA: Diagnosis not present

## 2014-09-14 HISTORY — DX: Other chronic pain: G89.29

## 2014-09-14 HISTORY — DX: Dorsalgia, unspecified: M54.9

## 2014-09-14 MED ORDER — OXYCODONE-ACETAMINOPHEN 5-325 MG PO TABS
2.0000 | ORAL_TABLET | Freq: Once | ORAL | Status: AC
Start: 2014-09-14 — End: 2014-09-14
  Administered 2014-09-14: 2 via ORAL
  Filled 2014-09-14: qty 2

## 2014-09-14 MED ORDER — OXYCODONE-ACETAMINOPHEN 5-325 MG PO TABS: 2.0000 | ORAL_TABLET | ORAL | Status: DC | PRN

## 2014-09-14 NOTE — ED Notes (Signed)
Bed: [id number] Expected date:  Expected time:  Means of arrival:  Comments: EMS 59 yo female back pain s/p fall-pain with palpation c-spine,thoracic and lumbar/immobilized

## 2014-09-14 NOTE — ED Notes (Signed)
Pt in radiology 

## 2014-09-14 NOTE — Discharge Instructions (Signed)
Back Pain, Adult °Low back pain is very common. About 1 in 5 people have back pain. The cause of low back pain is rarely dangerous. The pain often gets better over time. About half of people with a sudden onset of back pain feel better in just 2 weeks. About 8 in 10 people feel better by 6 weeks.  °CAUSES °Some common causes of back pain include: °· Strain of the muscles or ligaments supporting the spine. °· Wear and tear (degeneration) of the spinal discs. °· Arthritis. °· Direct injury to the back. °DIAGNOSIS °Most of the time, the direct cause of low back pain is not known. However, back pain can be treated effectively even when the exact cause of the pain is unknown. Answering your caregiver's questions about your overall health and symptoms is one of the most accurate ways to make sure the cause of your pain is not dangerous. If your caregiver needs more information, he or she may order lab work or imaging tests (X-rays or MRIs). However, even if imaging tests show changes in your back, this usually does not require surgery. °HOME CARE INSTRUCTIONS °For many people, back pain returns. Since low back pain is rarely dangerous, it is often a condition that people can learn to manage on their own.  °· Remain active. It is stressful on the back to sit or stand in one place. Do not sit, drive, or stand in one place for more than 30 minutes at a time. Take short walks on level surfaces as soon as pain allows. Try to increase the length of time you walk each day. °· Do not stay in bed. Resting more than 1 or 2 days can delay your recovery. °· Do not avoid exercise or work. Your body is made to move. It is not dangerous to be active, even though your back may hurt. Your back will likely heal faster if you return to being active before your pain is gone. °· Pay attention to your body when you  bend and lift. Many people have less discomfort when lifting if they bend their knees, keep the load close to their bodies, and  avoid twisting. Often, the most comfortable positions are those that put less stress on your recovering back. °· Find a comfortable position to sleep. Use a firm mattress and lie on your side with your knees slightly bent. If you lie on your back, put a pillow under your knees. °· Only take over-the-counter or prescription medicines as directed by your caregiver. Over-the-counter medicines to reduce pain and inflammation are often the most helpful. Your caregiver may prescribe muscle relaxant drugs. These medicines help dull your pain so you can more quickly return to your normal activities and healthy exercise. °· Put ice on the injured area. °¨ Put ice in a plastic bag. °¨ Place a towel between your skin and the bag. °¨ Leave the ice on for 15-20 minutes, 03-04 times a day for the first 2 to 3 days. After that, ice and heat may be alternated to reduce pain and spasms. °· Ask your caregiver about trying back exercises and gentle massage. This may be of some benefit. °· Avoid feeling anxious or stressed. Stress increases muscle tension and can worsen back pain. It is important to recognize when you are anxious or stressed and learn ways to manage it. Exercise is a great option. °SEEK MEDICAL CARE IF: °· You have pain that is not relieved with rest or medicine. °· You have pain that does not improve in 1 week. °· You have new symptoms. °· You are generally not feeling well. °SEEK   IMMEDIATE MEDICAL CARE IF:  °· You have pain that radiates from your back into your legs. °· You develop new bowel or bladder control problems. °· You have unusual weakness or numbness in your arms or legs. °· You develop nausea or vomiting. °· You develop abdominal pain. °· You feel faint. °Document Released: 02/27/2005 Document Revised: 08/29/2011 Document Reviewed: 07/01/2013 °Exit[employer]® Patient Information ©2015 [employer]. This information is not intended to replace advice given to you by your health care provider. Make sure you  discuss any questions you have with your health care provider. °Fall Prevention and Home Safety °Falls cause injuries and can affect all age groups. It is possible to use preventive measures to significantly decrease the likelihood of falls. There are many simple measures which can make your home safer and prevent falls. °OUTDOORS °· Repair cracks and edges of walkways and driveways. °· Remove high doorway thresholds. °· Trim shrubbery on the main path into your home. °· Have good outside lighting. °· Clear walkways of tools, rocks, debris, and clutter. °· Check that handrails are not broken and are securely fastened. Both sides of steps should have handrails. °· Have leaves, snow, and ice cleared regularly. °· Use sand or salt on walkways during winter months. °· In the garage, clean up grease or oil spills. °BATHROOM °· Install night lights. °· Install grab bars by the toilet and in the tub and shower. °· Use non-skid mats or decals in the tub or shower. °· Place a plastic non-slip stool in the shower to sit on, if needed. °· Keep floors dry and clean up all water on the floor immediately. °· Remove soap buildup in the tub or shower on a regular basis. °· Secure bath mats with non-slip, double-sided rug tape. °· Remove throw rugs and tripping hazards from the floors. °BEDROOMS °· Install night lights. °· Make sure a bedside light is easy to reach. °· Do not use oversized bedding. °· Keep a telephone by your bedside. °· Have a firm chair with side arms to use for getting dressed. °· Remove throw rugs and tripping hazards from the floor. °KITCHEN °· Keep handles on pots and pans turned toward the center of the stove. Use back burners when possible. °· Clean up spills quickly and allow time for drying. °· Avoid walking on wet floors. °· Avoid hot utensils and knives. °· Position shelves so they are not too high or low. °· Place commonly used objects within easy reach. °· If necessary, use a sturdy step stool with a  grab bar when reaching. °· Keep electrical cables out of the way. °· Do not use floor polish or wax that makes floors slippery. If you must use wax, use non-skid floor wax. °· Remove throw rugs and tripping hazards from the floor. °STAIRWAYS °· Never leave objects on stairs. °· Place handrails on both sides of stairways and use them. Fix any loose handrails. Make sure handrails on both sides of the stairways are as long as the stairs. °· Check carpeting to make sure it is firmly attached along stairs. Make repairs to worn or loose carpet promptly. °· Avoid placing throw rugs at the top or bottom of stairways, or properly secure the rug with carpet tape to prevent slippage. Get rid of throw rugs, if possible. °· Have an electrician put in a light switch at the top and bottom of the stairs. °OTHER FALL PREVENTION TIPS °· Wear low-heel or rubber-soled shoes that are supportive and fit well. Wear   closed toe shoes. °· When using a stepladder, make sure it is fully opened and both spreaders are firmly locked. Do not climb a closed stepladder. °· Add color or contrast paint or tape to grab bars and handrails in your home. Place contrasting color strips on first and last steps. °· Learn and use mobility aids as needed. Install an electrical emergency response system. °· Turn on lights to avoid dark areas. Replace light bulbs that burn out immediately. Get light switches that glow. °· Arrange furniture to create clear pathways. Keep furniture in the same place. °· Firmly attach carpet with non-skid or double-sided tape. °· Eliminate uneven floor surfaces. °· Select a carpet pattern that does not visually hide the edge of steps. °· Be aware of all pets. °OTHER HOME SAFETY TIPS °· Set the water temperature for 120° F (48.8° C). °· Keep emergency numbers on or near the telephone. °· Keep smoke detectors on every level of the home and near sleeping areas. °Document Released: 02/17/2002 Document Revised: 08/29/2011 Document  Reviewed: 05/19/2011 °ExitCare® Patient Information ©2015 [employer]. This information is not intended to replace advice given to you by your health care provider. Make sure you discuss any questions you have with your health care provider. ° °

## 2014-09-14 NOTE — ED Notes (Addendum)
Patient is from home and was transported by [facility]. Pt states she fell prior to arrival from slipping with flip flops on. Patient denies any LOC or hitting head when falling. Pt reports cervical, thoracic, and lumbar pain.Pt was placed in K-ED and c-collar. EMS reports no signs of trauma or injury from fall.  Also, patient reported to EMS that she needs a pain clinic for management of pain but can't find one. Pt has chronic back pain.

## 2014-09-14 NOTE — ED Provider Notes (Signed)
CSN: [id number]     Arrival date & time 09/14/14  2016 History   First MD Initiated Contact with Patient 09/14/14 2038     Chief Complaint  Patient presents with  . Fall  . Back Pain     (Consider location/radiation/quality/duration/timing/severity/associated sxs/prior Treatment) Patient is a 59 y.o. female presenting with fall. The history is provided by the patient. No language interpreter was used.  Fall This is a new problem. The current episode started today. The problem occurs constantly. The problem has been unchanged. Associated symptoms include neck pain. Pertinent negatives include no abdominal pain, headaches or joint swelling. Nothing aggravates the symptoms. She has tried nothing for the symptoms. The treatment provided no relief.  Pt slipped and fell onto her back.  Pt was wearing flip flops.  Pt reports no loc.  Pt reports pain from her neck all the way down.   Past Medical History  Diagnosis Date  . Diabetes   . Hyperlipemia   . OSA (obstructive sleep apnea)   . Depression   . GERD (gastroesophageal reflux disease)   . Chronic back pain    Past Surgical History  Procedure Laterality Date  . Gallbladder surgery    . Appenedectomy    . Hysterectomy    . Back surgery    . Breast lumpectomy      bilateral  . Knee surgery    . Ankle surgery     Family History  Problem Relation Age of Onset  . Diabetes Father   . High Cholesterol Father   . Heart disease Father   . Atrial fibrillation Father   . Cancer Sister   . Heart failure Sister   . Atrial fibrillation Mother   . Alzheimer's disease Mother    History  Substance Use Topics  . Smoking status: Current Every Day Smoker -- 0.10 packs/day for 20 years    Types: Cigarettes  . Smokeless tobacco: Not on file  . Alcohol Use: No   OB History    No data available     Review of Systems  Gastrointestinal: Negative for abdominal pain.  Musculoskeletal: Positive for back pain and neck pain. Negative for joint  swelling.  Neurological: Negative for headaches.  All other systems reviewed and are negative.     Allergies  Bee venom; Erythromycin; Lyrica; Sulfa antibiotics; and Tramadol  Home Medications   Prior to Admission medications   Medication Sig Start Date End Date Taking? Authorizing Provider  amoxicillin (AMOXIL) 500 MG capsule Take 1 capsule (500 mg total) by mouth 3 (three) times daily. Patient not taking: Reported on 02/27/2014 04/18/13   Reuben Likesavid C Keller, MD  amoxicillin-clavulanate (AUGMENTIN) 875-125 MG per tablet Take 1 tablet by mouth 2 (two) times daily. 02/25/14   Heather Laisure, PA-C  amphetamine-dextroamphetamine (ADDERALL XR) 30 MG 24 hr capsule Take 30 mg by mouth daily. 02/22/14   Historical Provider, MD  ARIPiprazole (ABILIFY) 10 MG tablet Take 10 mg by mouth daily.    Historical Provider, MD  clorazepate (TRANXENE) 3.75 MG tablet Take 3.75 mg by mouth 2 (two) times daily as needed for anxiety.  01/21/14   Historical Provider, MD  dexlansoprazole (DEXILANT) 60 MG capsule Take 60 mg by mouth every morning.     Historical Provider, MD  diphenoxylate-atropine (LOMOTIL) 2.5-0.025 MG per tablet Take 2 tablets by mouth 4 (four) times daily as needed for diarrhea or loose stools. 02/27/14   Renne CriglerJoshua Geiple, PA-C  DULoxetine (CYMBALTA) 60 MG capsule Take 60 mg by  mouth daily. 02/16/14   Historical Provider, MD  EPIPEN 2-PAK 0.3 MG/0.3ML SOAJ injection Inject 0.3 mg into the muscle once. for allergic reaction 11/18/13   Historical Provider, MD  famotidine (PEPCID) 40 MG tablet Take 40 mg by mouth at bedtime. 01/29/14   Historical Provider, MD  gabapentin (NEURONTIN) 300 MG capsule Take 300 mg by mouth 3 (three) times daily.    Historical Provider, MD  gabapentin (NEURONTIN) 600 MG tablet Take 300 mg by mouth 3 (three) times daily.     Historical Provider, MD  HYDROcodone-acetaminophen (NORCO/VICODIN) 5-325 MG per tablet 1 to 2 tabs every 4 to 6 hours as needed for pain. Patient taking  differently: Take 1-2 tablets by mouth every 4 (four) hours as needed (every 4 to 6 hours as needed for pain.).  04/18/13   Reuben Likes, MD  lamoTRIgine (LAMICTAL) 100 MG tablet Take 100 mg by mouth at bedtime. 02/18/14   Historical Provider, MD  Linaclotide (LINZESS) 145 MCG CAPS Take 145 mcg by mouth daily.    Historical Provider, MD  megestrol (MEGACE) 20 MG tablet Take 20 mg by mouth 2 (two) times daily.    Historical Provider, MD  methocarbamol (ROBAXIN) 750 MG tablet Take 750 mg by mouth every 4 (four) hours as needed for muscle spasms.  01/29/14   Historical Provider, MD  morphine (MS CONTIN) 15 MG 12 hr tablet Take 15 mg by mouth every 12 (twelve) hours. 01/21/14   Historical Provider, MD  MYRBETRIQ 50 MG TB24 tablet Take 50 mg by mouth daily. 02/10/14   Historical Provider, MD  omeprazole (PRILOSEC) 20 MG capsule Take 1 capsule (20 mg total) by mouth daily. 02/25/14   Heather Laisure, PA-C  ondansetron (ZOFRAN ODT) 4 MG disintegrating tablet Take 1 tablet (4 mg total) by mouth every 8 (eight) hours as needed for nausea or vomiting. 02/27/14   Renne Crigler, PA-C  PROAIR HFA 108 (90 BASE) MCG/ACT inhaler Inhale 2 puffs into the lungs every 6 (six) hours as needed for wheezing or shortness of breath.  12/09/13   Historical Provider, MD  simvastatin (ZOCOR) 20 MG tablet Take 20 mg by mouth every evening.    Historical Provider, MD  sucralfate (CARAFATE) 1 G tablet Take 1 g by mouth 4 (four) times daily -  with meals and at bedtime.    Historical Provider, MD  trimethoprim (TRIMPEX) 100 MG tablet Take 100 mg by mouth daily.    Historical Provider, MD  ziprasidone (GEODON) 40 MG capsule Take 40 mg by mouth at bedtime.    Historical Provider, MD  zolpidem (AMBIEN) 10 MG tablet Take 10 mg by mouth at bedtime as needed for sleep.    Historical Provider, MD  zolpidem (AMBIEN) 5 MG tablet Take 10 mg by mouth at bedtime.  05/07/12   Coralyn Helling, MD   BP 133/86 mmHg  Pulse 93  Temp(Src) 98.7 F (37.1  C)  Resp 18  Ht  (1.549 m)  Wt 140 lb (63.504 kg)  BMI 26.47 kg/m2  SpO2 95% Physical Exam  Constitutional: She is oriented to person, place, and time. She appears well-developed and well-nourished.  HENT:  Head: Normocephalic and atraumatic.  Right Ear: External ear normal.  Mouth/Throat: Oropharynx is clear and moist.  Eyes: EOM are normal. Pupils are equal, round, and reactive to light.  Neck: Normal range of motion.  Cardiovascular: Normal rate and normal heart sounds.   Pulmonary/Chest: Effort normal.  Abdominal: Soft. She exhibits no distension.  Musculoskeletal:  Spine tender from c spine to ls spine from arms and legs.  Neurological: She is alert and oriented to person, place, and time.  Skin: Skin is warm.  Psychiatric: She has a normal mood and affect.  Nursing note and vitals reviewed.   ED Course  Procedures (including critical care time) Labs Review Labs Reviewed - No data to display  Imaging Review No results found.   EKG Interpretation None      MDM Pt given 2 percocets.  Cspine, tspine and ls spine show no acute injury   Final diagnoses:  Neck pain  Low back pain without sciatica, unspecified back pain laterality    Percocet Follow up with Dr. Earlie Server, PA-C 09/14/14 2212  Derwood Kaplan, MD 09/16/14 [phone]

## 2014-09-14 NOTE — ED Notes (Signed)
Pt ambulated to the hallway and complained of pain and said she could not go any further.

## 2014-09-14 NOTE — ED Notes (Signed)
Pt has reports when she feel, she felt a "crack". When asked about numbness and tingling, pt states "my legs feel funny". Pt's skin is warm, dry. Cap refill less than 3 seconds. No loss of bowel or bladder.

## 2014-09-14 NOTE — ED Notes (Signed)
Pt's c-collar remains but K-ED has been removed.

## 2014-11-18 NOTE — Telephone Encounter (Signed)
Fax received from [employer] requesting a certificate for medical necessity for CIC, Lubricant, and diapers. Unfortunately, the pt has not been seen by Dr. Nada Boozer since 07/04/2011. Therefore, we cannot complete this form at this time. I called the pt to inform of this situation and offered the pt an appointment with Dr. Nada Boozer. The pt declined and stated that she is seeing a different Urologist now and will call [facility] [employer] to update those records.   I then called [facility] [employer] and explained this information to them. I also advised them that the patient stated that she would call to update the information. They thinked me for the info and stated that one of the reps will be calling her today as well.

## 2015-03-26 DIAGNOSIS — I10 Essential (primary) hypertension: Secondary | ICD-10-CM | POA: Insufficient documentation

## 2015-06-17 DIAGNOSIS — E611 Iron deficiency: Secondary | ICD-10-CM | POA: Insufficient documentation

## 2015-10-18 ENCOUNTER — Encounter (HOSPITAL_COMMUNITY): Payer: Self-pay | Admitting: *Deleted

## 2015-10-18 ENCOUNTER — Emergency Department (HOSPITAL_COMMUNITY): Payer: Medicare HMO

## 2015-10-18 ENCOUNTER — Emergency Department (HOSPITAL_COMMUNITY)
Admission: EM | Admit: 2015-10-18 | Discharge: 2015-10-18 | Disposition: A | Discharge status: Home / Self Care | Payer: Medicare HMO | Attending: Emergency Medicine | Admitting: Emergency Medicine

## 2015-10-18 DIAGNOSIS — Y999 Unspecified external cause status: Secondary | ICD-10-CM | POA: Insufficient documentation

## 2015-10-18 DIAGNOSIS — Y9389 Activity, other specified: Secondary | ICD-10-CM | POA: Insufficient documentation

## 2015-10-18 DIAGNOSIS — Y92002 Bathroom of unspecified non-institutional (private) residence single-family (private) house as the place of occurrence of the external cause: Secondary | ICD-10-CM | POA: Diagnosis not present

## 2015-10-18 DIAGNOSIS — W19XXXA Unspecified fall, initial encounter: Secondary | ICD-10-CM | POA: Diagnosis not present

## 2015-10-18 DIAGNOSIS — S99921A Unspecified injury of right foot, initial encounter: Secondary | ICD-10-CM | POA: Diagnosis present

## 2015-10-18 DIAGNOSIS — F17210 Nicotine dependence, cigarettes, uncomplicated: Secondary | ICD-10-CM | POA: Diagnosis not present

## 2015-10-18 DIAGNOSIS — S92331A Displaced fracture of third metatarsal bone, right foot, initial encounter for closed fracture: Secondary | ICD-10-CM | POA: Diagnosis not present

## 2015-10-18 DIAGNOSIS — S92341A Displaced fracture of fourth metatarsal bone, right foot, initial encounter for closed fracture: Secondary | ICD-10-CM | POA: Insufficient documentation

## 2015-10-18 DIAGNOSIS — Z79899 Other long term (current) drug therapy: Secondary | ICD-10-CM | POA: Diagnosis not present

## 2015-10-18 DIAGNOSIS — E119 Type 2 diabetes mellitus without complications: Secondary | ICD-10-CM | POA: Insufficient documentation

## 2015-10-18 DIAGNOSIS — S92901A Unspecified fracture of right foot, initial encounter for closed fracture: Secondary | ICD-10-CM

## 2015-10-18 DIAGNOSIS — S92321A Displaced fracture of second metatarsal bone, right foot, initial encounter for closed fracture: Secondary | ICD-10-CM | POA: Insufficient documentation

## 2015-10-18 LAB — CBC WITH DIFFERENTIAL/PLATELET
Basophils Absolute: 0 10*3/uL (ref 0.0–0.1)
Basophils Relative: 0 %
EOS ABS: 0.1 10*3/uL (ref 0.0–0.7)
EOS PCT: 1 %
HCT: 38.6 % (ref 36.0–46.0)
Hemoglobin: 12.3 g/dL (ref 12.0–15.0)
LYMPHS ABS: 2.3 10*3/uL (ref 0.7–4.0)
Lymphocytes Relative: 20 %
MCH: 28.2 pg (ref 26.0–34.0)
MCHC: 31.9 g/dL (ref 30.0–36.0)
MCV: 88.5 fL (ref 78.0–100.0)
MONOS PCT: 10 %
Monocytes Absolute: 1.1 10*3/uL — ABNORMAL HIGH (ref 0.1–1.0)
NEUTROS ABS: 7.7 10*3/uL (ref 1.7–7.7)
Neutrophils Relative %: 69 %
PLATELETS: 358 10*3/uL (ref 150–400)
RBC: 4.36 MIL/uL (ref 3.87–5.11)
RDW: 14 % (ref 11.5–15.5)
WBC: 11.2 10*3/uL — ABNORMAL HIGH (ref 4.0–10.5)

## 2015-10-18 LAB — BASIC METABOLIC PANEL
Anion gap: 10 (ref 5–15)
BUN: 19 mg/dL (ref 6–20)
CHLORIDE: 100 mmol/L — AB (ref 101–111)
CO2: 28 mmol/L (ref 22–32)
Calcium: 9.9 mg/dL (ref 8.9–10.3)
Creatinine, Ser: 0.84 mg/dL (ref 0.44–1.00)
GFR calc Af Amer: >60 mL/min (ref >60)
GFR calc non Af Amer: >60 mL/min (ref >60)
Glucose, Bld: 109 mg/dL — ABNORMAL HIGH (ref 65–99)
POTASSIUM: 4.3 mmol/L (ref 3.5–5.1)
Sodium: 138 mmol/L (ref 135–145)

## 2015-10-18 LAB — I-STAT TROPONIN, ED: Troponin i, poc: 0 ng/mL (ref 0.00–0.08)

## 2015-10-18 MED ORDER — DIAZEPAM 5 MG PO TABS
5.0000 mg | ORAL_TABLET | Freq: Once | ORAL | Status: AC
  Administered 2015-10-18: 5 mg via ORAL
  Filled 2015-10-18: qty 1

## 2015-10-18 MED ORDER — HYDROMORPHONE HCL 1 MG/ML IJ SOLN
1.0000 mg | Freq: Once | INTRAMUSCULAR | Status: AC
  Administered 2015-10-18: 1 mg via INTRAMUSCULAR
  Filled 2015-10-18: qty 1

## 2015-10-18 MED ORDER — METHOCARBAMOL 750 MG PO TABS: 750.0000 mg | ORAL_TABLET | Freq: Four times a day (QID) | ORAL | 0 refills | Status: AC

## 2015-10-18 NOTE — ED Triage Notes (Signed)
Per EMS - patient fell last night while trying to help her granddaughter in bathroom.  Patient now presents with right ankle pain/swelling.  Patient's vitals WNL 144/81, HR 86, 95% on RA.

## 2015-10-18 NOTE — ED Provider Notes (Signed)
MSE was initiated and I personally evaluated the patient and placed orders (if any) at  1:39 PM on October 18, 2015.  60 year old female here after a syncopal event. She states she was at home yesterday with her granddaughter who called her into the bathroom to help fix her hair. She reports she told her granddaughter she was going to go lie down, however she passed out.  States she does not remember the fall.  Does not recall if she hit her head or not.  She awoke to her sister standing over her.  She does report history of syncope in the past that was felt to be medication related, however she has been taken off of those medications since. She denies any new medication changes. She denies any chest pain or shortness of breath currently. She mostly complains of right ankle and foot pain as well as low back pain. she has obvious swelling and bruising to the right ankle/foot.  She does report history of surgeries to her back in the past and has 4 screws in her lumbar spine. She has not been ambulatory since the event yesterday.  Patient with syncopal episode rather than simple fall. She will require further workup given this.  X-ray ordered of right ankle, foot, and lumbar spine. basic labs, ekg ordered as well.  The patient appears stable so that the remainder of the MSE may be completed by another provider.   Garlon HatchetLisa M Anett Ranker, PA-C 10/18/15 1426    Shaune Pollackameron Isaacs, MD 10/19/15 1100

## 2015-10-18 NOTE — ED Notes (Signed)
Pt back from x-ray.

## 2015-10-18 NOTE — Discharge Instructions (Signed)
Call your orthopedic surgeon to schedule a follow-up visit

## 2015-10-18 NOTE — ED Notes (Signed)
Bed: WA20 Expected date:  Expected time:  Means of arrival:  Comments: Fast track [id number]   Garlon HatchetLisa M Sanders, PA-C 10/18/15 1430

## 2015-10-18 NOTE — ED Notes (Signed)
Patient was alert, oriented and stable upon discharge. RN went over AVS and patient had no further questions.  

## 2015-10-18 NOTE — ED Notes (Signed)
Pt states she passed out last night and has no recollection of how she injured her ankle.

## 2015-10-18 NOTE — ED Provider Notes (Signed)
[facility]-EMERGENCY DEPT Provider Note   CSN: [id number] Arrival date & time: 10/18/15  1301  First Provider Contact:  First MD Initiated Contact with Patient 10/18/15 1327        History   Chief Complaint Chief Complaint  Patient presents with  . Ankle Pain  . Loss of Consciousness    HPI Chelsey Castro is a 60 y.o. female.  60 year old female presents after having a witnessed syncopal event yesterday when she was helping her daughter in the bathroom. Denies any symptoms prior to the event. No reported seizure activity. Does complain of right ankle or right foot pain from the event. No recent medication changes or blood loss. Denies any anginal symptoms have been dyspneic. States the episode was brief and lasted for a few seconds. Feels at her baseline at this time. Hasn't used anything for medications prior to arrival   The history is provided by the patient.  Ankle Pain    Loss of Consciousness      Past Medical History:  Diagnosis Date  . Chronic back pain   . Depression   . Diabetes (HCC)   . GERD (gastroesophageal reflux disease)   . Hyperlipemia   . OSA (obstructive sleep apnea)     Patient Active Problem List   Diagnosis Date Noted  . OSA (obstructive sleep apnea) 05/07/2012    Past Surgical History:  Procedure Laterality Date  . ANKLE SURGERY    . appenedectomy    . BACK SURGERY    . BREAST LUMPECTOMY     bilateral  . GALLBLADDER SURGERY    . hysterectomy    . KNEE SURGERY      OB History    No data available       Home Medications    Prior to Admission medications   Medication Sig Start Date End Date Taking? Authorizing Provider  amphetamine-dextroamphetamine (ADDERALL XR) 30 MG 24 hr capsule Take 60 mg by mouth daily.  02/22/14  Yes Historical Provider, MD  busPIRone (BUSPAR) 10 MG tablet Take 10 mg by mouth 3 (three) times daily.   Yes Historical Provider, MD  Cyanocobalamin (RA VITAMIN B-12 TR) 1000 MCG TBCR Take 1,000 mcg by mouth daily.  06/11/15  Yes Historical Provider, MD  cycloSPORINE (RESTASIS) 0.05 % ophthalmic emulsion Place 1 drop into both eyes 2 (two) times daily. 06/24/15  Yes Historical Provider, MD  dexlansoprazole (DEXILANT) 60 MG capsule Take 60 mg by mouth every morning.    Yes Historical Provider, MD  DULoxetine (CYMBALTA) 60 MG capsule Take 60 mg by mouth daily. 02/16/14  Yes Historical Provider, MD  EPIPEN 2-PAK 0.3 MG/0.3ML SOAJ injection Inject 0.3 mg into the muscle once. for allergic reaction 11/18/13  Yes Historical Provider, MD  famotidine (PEPCID) 40 MG tablet Take 40 mg by mouth at bedtime. 01/29/14  Yes Historical Provider, MD  fluticasone (FLONASE) 50 MCG/ACT nasal spray Place 1 spray into both nostrils daily.   Yes Historical Provider, MD  gabapentin (NEURONTIN) 300 MG capsule Take 300 mg by mouth 3 (three) times daily.   Yes Historical Provider, MD  lamoTRIgine (LAMICTAL) 100 MG tablet Take 100 mg by mouth at bedtime. 02/18/14  Yes Historical Provider, MD  Linaclotide (LINZESS) 145 MCG CAPS Take 145 mcg by mouth daily.   Yes Historical Provider, MD  meloxicam (MOBIC) 15 MG tablet Take 15 mg by mouth daily.   Yes Historical Provider, MD  oxyCODONE-acetaminophen (PERCOCET/ROXICET) 5-325 MG per tablet Take 2 tablets by mouth every 4 (four) hours  as needed for severe pain. Patient taking differently: Take 2 tablets by mouth 3 (three) times daily.  09/14/14  Yes Lonia Skinner Sofia, PA-C  PROAIR HFA 108 [phone] BASE) MCG/ACT inhaler Inhale 2 puffs into the lungs every 6 (six) hours as needed for wheezing or shortness of breath.  12/09/13  Yes Historical Provider, MD  QUEtiapine (SEROQUEL) 100 MG tablet Take 100 mg by mouth at bedtime.   Yes Historical Provider, MD  simvastatin (ZOCOR) 20 MG tablet Take 20 mg by mouth every evening.   Yes Historical Provider, MD  sucralfate (CARAFATE) 1 g tablet Take 1 g by mouth 3 (three) times daily.   Yes Historical Provider, MD  trimethoprim (TRIMPEX) 100 MG tablet Take 100 mg by mouth  daily.   Yes Historical Provider, MD  diphenoxylate-atropine (LOMOTIL) 2.5-0.025 MG per tablet Take 2 tablets by mouth 4 (four) times daily as needed for diarrhea or loose stools. Patient not taking: Reported on 10/18/2015 02/27/14   Renne Crigler, PA-C  HYDROcodone-acetaminophen (NORCO/VICODIN) 5-325 MG per tablet 1 to 2 tabs every 4 to 6 hours as needed for pain. Patient not taking: Reported on 09/14/2014 04/18/13   Reuben Likes, MD    Family History Family History  Problem Relation Age of Onset  . Diabetes Father   . High Cholesterol Father   . Heart disease Father   . Atrial fibrillation Father   . Atrial fibrillation Mother   . Alzheimer's disease Mother   . Cancer Sister   . Heart failure Sister     Social History Social History  Substance Use Topics  . Smoking status: Current Every Day Smoker    Packs/day: 0.10    Years: 20.00    Types: Cigarettes  . Smokeless tobacco: Never Used  . Alcohol use No     Allergies   Bee venom; Erythromycin; Lyrica [pregabalin]; Sulfa antibiotics; and Tramadol   Review of Systems Review of Systems  Cardiovascular: Positive for syncope.  All other systems reviewed and are negative.    Physical Exam Updated Vital Signs BP 147/89 (BP Location: Left Arm)   Pulse 80   Temp 98.2 F (36.8 C) (Oral)   Resp 16   SpO2 92%   Physical Exam  Constitutional: She is oriented to person, place, and time. She appears well-developed and well-nourished.  Non-toxic appearance. No distress.  HENT:  Head: Normocephalic and atraumatic.  Eyes: Conjunctivae, EOM and lids are normal. Pupils are equal, round, and reactive to light.  Neck: Normal range of motion. Neck supple. No tracheal deviation present. No thyroid mass present.  Cardiovascular: Normal rate, regular rhythm and normal heart sounds.  Exam reveals no gallop.   No murmur heard. Pulmonary/Chest: Effort normal and breath sounds normal. No stridor. No respiratory distress. She has no  decreased breath sounds. She has no wheezes. She has no rhonchi. She has no rales.  Abdominal: Soft. Normal appearance and bowel sounds are normal. She exhibits no distension. There is no tenderness. There is no rebound and no CVA tenderness.  Musculoskeletal: Normal range of motion. She exhibits no edema or tenderness.  Right foot with edema and ecchymosis to the dorsum of the foot with intact dorsalis pedis pulse. Right ankle with edema also without gross deformity.  Neurological: She is alert and oriented to person, place, and time. She has normal strength. No cranial nerve deficit or sensory deficit. GCS eye subscore is 4. GCS verbal subscore is 5. GCS motor subscore is 6.  Skin: Skin is warm  and dry. No abrasion and no rash noted.  Psychiatric: She has a normal mood and affect. Her speech is normal and behavior is normal.  Nursing note and vitals reviewed.    ED Treatments / Results  Labs (all labs ordered are listed, but only abnormal results are displayed) Labs Reviewed  CBC WITH DIFFERENTIAL/PLATELET - Abnormal; Notable for the following:       Result Value   WBC 11.2 (*)    Monocytes Absolute 1.1 (*)    All other components within normal limits  BASIC METABOLIC PANEL - Abnormal; Notable for the following:    Chloride 100 (*)    Glucose, Bld 109 (*)    All other components within normal limits  I-STAT TROPOININ, ED    EKG  EKG Interpretation  Date/Time:  Monday October 18 2015 14:50:28 EDT Ventricular Rate:  79 PR Interval:    QRS Duration: 95 QT Interval:  414 QTC Calculation: 475 R Axis:   26 Text Interpretation:  Sinus rhythm Confirmed by Freida Busman  MD, Jolissa Kapral ([id number]) on 10/18/2015 2:56:37 PM       Radiology Dg Lumbar Spine Complete  Result Date: 10/18/2015 CLINICAL DATA:  60 year old female fell yesterday. Low back pain. Initial encounter. EXAM: LUMBAR SPINE - COMPLETE 4+ VIEW COMPARISON:  06/10/2015 plain film exam. FINDINGS: Post fusion L5-S1. Disc space narrowing  L4-5. No acute fracture. Minimal curvature lumbar spine convex to the right. IMPRESSION: Fusion L5-S1 Mild L4-5 disc space narrowing. No acute fracture. Electronically Signed   By: Lacy Duverney M.D.   On: 10/18/2015 14:32   Dg Ankle Complete Right  Result Date: 10/18/2015 CLINICAL DATA:  Acute right ankle pain and swelling after fall yesterday. EXAM: RIGHT ANKLE - COMPLETE 3+ VIEW COMPARISON:  None. FINDINGS: Mildly displaced second, third and fourth proximal metatarsal fractures are again noted. There is no fracture involving the distal tibia or fibula or talus. Talotibial joint appears intact. Soft tissue swelling is seen over lateral malleolus suggesting ligamentous injury. IMPRESSION: Second, third and fourth metatarsal fractures are noted. Soft tissue swelling seen over lateral malleolus suggesting ligamentous injury. Electronically Signed   By: Lupita Raider, M.D.   On: 10/18/2015 15:23   Dg Foot Complete Right  Result Date: 10/18/2015 CLINICAL DATA:  Acute right foot pain and swelling after fall yesterday. EXAM: RIGHT FOOT COMPLETE - 3+ VIEW COMPARISON:  None. FINDINGS: Moderately displaced oblique fracture is seen involving the proximal second metatarsal. Mildly displaced oblique fractures are also seen involving the proximal portions of third and fourth metatarsals. These appear to be closed and posttraumatic. Swelling of dorsal soft tissues is noted. IMPRESSION: Displaced second, third and fourth metatarsals. Electronically Signed   By: Lupita Raider, M.D.   On: 10/18/2015 14:32    Procedures Procedures (including critical care time)  Medications Ordered in ED Medications  HYDROmorphone (DILAUDID) injection 1 mg (1 mg Intramuscular Given 10/18/15 1511)  diazepam (VALIUM) tablet 5 mg (5 mg Oral Given 10/18/15 1511)     Initial Impression / Assessment and Plan / ED Course  I have reviewed the triage vital signs and the nursing notes.  Pertinent labs & imaging results that were  available during my care of the patient were reviewed by me and considered in my medical decision making (see chart for details).  Clinical Course     EKG Interpretation  Date/Time:  Monday October 18 2015 14:50:28 EDT Ventricular Rate:  79 PR Interval:    QRS Duration: 95 QT Interval:  414  QTC Calculation: 475 R Axis:   26 Text Interpretation:  Sinus rhythm Confirmed by Freida BusmanALLEN  MD, Katrinna Travieso ([id number]) on 10/18/2015 2:56:37 PM      Patient medicated for her acute foot injury. Given a cam walker by [employer]. Will follow-up with her orthopedist  Final Clinical Impressions(s) / ED Diagnoses   Final diagnoses:  None    New Prescriptions New Prescriptions   No medications on file     Lorre NickAnthony Morena Mckissack, MD 10/18/15 1600

## 2015-12-20 DIAGNOSIS — F988 Other specified behavioral and emotional disorders with onset usually occurring in childhood and adolescence: Secondary | ICD-10-CM | POA: Insufficient documentation

## 2015-12-20 DIAGNOSIS — F411 Generalized anxiety disorder: Secondary | ICD-10-CM | POA: Insufficient documentation

## 2015-12-20 DIAGNOSIS — F331 Major depressive disorder, recurrent, moderate: Secondary | ICD-10-CM | POA: Insufficient documentation

## 2016-01-10 DIAGNOSIS — M549 Dorsalgia, unspecified: Secondary | ICD-10-CM

## 2016-01-10 DIAGNOSIS — G8929 Other chronic pain: Secondary | ICD-10-CM | POA: Insufficient documentation

## 2016-01-28 DIAGNOSIS — G894 Chronic pain syndrome: Secondary | ICD-10-CM | POA: Insufficient documentation

## 2016-01-28 DIAGNOSIS — F1190 Opioid use, unspecified, uncomplicated: Secondary | ICD-10-CM | POA: Insufficient documentation

## 2016-01-31 ENCOUNTER — Ambulatory Visit (INDEPENDENT_AMBULATORY_CARE_PROVIDER_SITE_OTHER): Payer: 59 | Admitting: Psychiatry

## 2016-01-31 ENCOUNTER — Encounter (HOSPITAL_COMMUNITY): Payer: Self-pay | Admitting: Psychiatry

## 2016-01-31 VITALS — BP 140/80 | Ht 61.0 in | Wt 170.0 lb

## 2016-01-31 DIAGNOSIS — G894 Chronic pain syndrome: Secondary | ICD-10-CM

## 2016-01-31 DIAGNOSIS — Z79899 Other long term (current) drug therapy: Secondary | ICD-10-CM | POA: Diagnosis not present

## 2016-01-31 DIAGNOSIS — Z888 Allergy status to other drugs, medicaments and biological substances status: Secondary | ICD-10-CM | POA: Diagnosis not present

## 2016-01-31 DIAGNOSIS — F5102 Adjustment insomnia: Secondary | ICD-10-CM

## 2016-01-31 DIAGNOSIS — F411 Generalized anxiety disorder: Secondary | ICD-10-CM

## 2016-01-31 DIAGNOSIS — Z8249 Family history of ischemic heart disease and other diseases of the circulatory system: Secondary | ICD-10-CM

## 2016-01-31 DIAGNOSIS — F17210 Nicotine dependence, cigarettes, uncomplicated: Secondary | ICD-10-CM | POA: Diagnosis not present

## 2016-01-31 DIAGNOSIS — Z91030 Bee allergy status: Secondary | ICD-10-CM

## 2016-01-31 DIAGNOSIS — Z833 Family history of diabetes mellitus: Secondary | ICD-10-CM

## 2016-01-31 DIAGNOSIS — F331 Major depressive disorder, recurrent, moderate: Secondary | ICD-10-CM

## 2016-01-31 DIAGNOSIS — Z808 Family history of malignant neoplasm of other organs or systems: Secondary | ICD-10-CM | POA: Diagnosis not present

## 2016-01-31 MED ORDER — QUETIAPINE FUMARATE 100 MG PO TABS: 150.0000 mg | ORAL_TABLET | Freq: Every day | ORAL | 0 refills | Status: DC

## 2016-01-31 NOTE — Progress Notes (Signed)
Psychiatric Initial Adult Assessment   Patient Identification: Chelsey JesterKathleen Castro MRN:  [id number] Date of Evaluation:  01/31/2016 Referral Source: Dr/ Reche Dixonalbot.  Chief Complaint:   Chief Complaint    Establish Care     Visit Diagnosis:    ICD-9-CM ICD-10-CM   1. GAD (generalized anxiety disorder) 300.02 F41.1   2. Moderate episode of recurrent major depressive disorder (HCC) 296.32 F33.1   3. Chronic pain syndrome 338.4 G89.4   4. Adjustment insomnia 307.41 F51.02     History of Present Illness:  60 years old currently single Caucasian female living with her ex-husband referred to primary care physician. She has seen Dr. Baldemar FridayLuv for psychiatry in the past last visit was around 3 months ago. Patient states to be diagnosed with mood disorder anxiety disorder recurrent depression and insomnia she also suffers from chronic back pain Patient states that she is taking her medications including Lamictal, Seroquel, Cymbalta and also she has taken amphetamines last use was 3 weeks ago 10-20 mg  Patient endorses feeling down depressed worries excessively at night and mind racing difficulty sleeping. She endorses history of depression episodes that has been sober 1 year ago that she was [facility] because of hopelessness and suicidal ideation  Her depression is at a rate of 5 out of 10. 10 being no depression She endorses current anxiety worries and difficulty sleeping. She has been on ADHD medication for inattention but has not been on it for the last 3 weeks she does feel to easily get out of focus especially when she is depressed over her mind is racing  Her aggravating factors as she lost her dad 1 year ago. Difficult relocation from [place] because her sister keeps her guilty at times of leaving the family   modifying factors include her grandson and keep herself busy at home support from her ex-husband   other aggravating factors as her chronic back condition she has  had multiple surgeries now she is looking for a stimulator surgery       Associated Signs/Symptoms: Depression Symptoms:  anhedonia, difficulty concentrating, anxiety, loss of energy/fatigue, disturbed sleep, weight gain, (Hypo) Manic Symptoms:  Distractibility, Labiality of Mood, Anxiety Symptoms:  Excessive Worry, Psychotic Symptoms:  denies PTSD Symptoms: NA  Past Psychiatric History: She has been on medication for the last 10 years mostly outpatient treatment one year ago in 2016 she was admitted in the hospital for depression and suicidal ideation  ous Psychotropic Medications: Yes   Substance Abuse History in the last 12 months:  No.  Uses opiate meds for her back pain. Says it is prescribed and has surgeries in the past.   Consequences of Substance Abuse: NA  Past Medical History:  Past Medical History:  Diagnosis Date  . Chronic back pain   . Depression   . Diabetes (HCC)   . GERD (gastroesophageal reflux disease)   . Hyperlipemia   . OSA (obstructive sleep apnea)     Past Surgical History:  Procedure Laterality Date  . ANKLE SURGERY    . appenedectomy    . BACK SURGERY    . BREAST LUMPECTOMY     bilateral  . GALLBLADDER SURGERY    . hysterectomy    . KNEE SURGERY      Family Psychiatric History: not know or denies  Family History:  Family History  Problem Relation Age of Onset  . Diabetes Father   . High Cholesterol Father   . Heart disease Father   .  Atrial fibrillation Father   . Atrial fibrillation Mother   . Alzheimer's disease Mother   . Cancer Sister   . Heart failure Sister     Social History:   Social History   Social History  . Marital status: Single    Spouse name: N/A  . Number of children: N/A  . Years of education: N/A   Occupational History  . disabled    Social History Main Topics  . Smoking status: Current Every Day Smoker    Packs/day: 0.10    Years: 20.00    Types: Cigarettes  . Smokeless tobacco: Never  Used  . Alcohol use No  . Drug use: No  . Sexual activity: Not Asked   Other Topics Concern  . None   Social History Narrative  . None    Additional Social History: Group with her parents she does not elaborate too much about her childhood and says it was good. She has been married once now she lives with her ex-husband there is no current legal issues she has grown kids in her 30s  Allergies:   Allergies  Allergen Reactions  . Bee Venom Anaphylaxis and Swelling  . Erythromycin Hives and Shortness Of Breath  . Lyrica [Pregabalin] Nausea And Vomiting and Rash  . Sulfa Antibiotics Rash  . Tramadol Rash    Metabolic Disorder Labs: No results found for: HGBA1C, MPG No results found for: PROLACTIN No results found for: CHOL, TRIG, HDL, CHOLHDL, VLDL, LDLCALC   Current Medications: Current Outpatient Prescriptions  Medication Sig Dispense Refill  . busPIRone (BUSPAR) 10 MG tablet Take 10 mg by mouth 3 (three) times daily.    . Cyanocobalamin (RA VITAMIN B-12 TR) 1000 MCG TBCR Take 1,000 mcg by mouth daily.    . cycloSPORINE (RESTASIS) 0.05 % ophthalmic emulsion Place 1 drop into both eyes 2 (two) times daily.    Marland Kitchen dexlansoprazole (DEXILANT) 60 MG capsule Take 60 mg by mouth every morning.     . diphenoxylate-atropine (LOMOTIL) 2.5-0.025 MG per tablet Take 2 tablets by mouth 4 (four) times daily as needed for diarrhea or loose stools. (Patient not taking: Reported on 10/18/2015) 20 tablet 0  . DULoxetine (CYMBALTA) 60 MG capsule Take 60 mg by mouth daily.  5  . EPIPEN 2-PAK 0.3 MG/0.3ML SOAJ injection Inject 0.3 mg into the muscle once. for allergic reaction  1  . famotidine (PEPCID) 40 MG tablet Take 40 mg by mouth at bedtime.  0  . fluticasone (FLONASE) 50 MCG/ACT nasal spray Place 1 spray into both nostrils daily.    Marland Kitchen gabapentin (NEURONTIN) 300 MG capsule Take 300 mg by mouth 3 (three) times daily.    Marland Kitchen HYDROcodone-acetaminophen (NORCO/VICODIN) 5-325 MG per tablet 1 to 2 tabs  every 4 to 6 hours as needed for pain. (Patient not taking: Reported on 09/14/2014) 20 tablet 0  . lamoTRIgine (LAMICTAL) 100 MG tablet Take 100 mg by mouth at bedtime.  0  . Linaclotide (LINZESS) 145 MCG CAPS Take 145 mcg by mouth daily.    . meloxicam (MOBIC) 15 MG tablet Take 15 mg by mouth daily.    . methocarbamol (ROBAXIN-750) 750 MG tablet Take 1 tablet (750 mg total) by mouth 4 (four) times daily. 30 tablet 0  . oxyCODONE-acetaminophen (PERCOCET/ROXICET) 5-325 MG per tablet Take 2 tablets by mouth every 4 (four) hours as needed for severe pain. (Patient taking differently: Take 2 tablets by mouth 3 (three) times daily. ) 15 tablet 0  . PROAIR HFA  108 (90 BASE) MCG/ACT inhaler Inhale 2 puffs into the lungs every 6 (six) hours as needed for wheezing or shortness of breath.   4  . QUEtiapine (SEROQUEL) 100 MG tablet Take 1.5 tablets (150 mg total) by mouth at bedtime. 45 tablet 0  . simvastatin (ZOCOR) 20 MG tablet Take 20 mg by mouth every evening.    . sucralfate (CARAFATE) 1 g tablet Take 1 g by mouth 3 (three) times daily.    Marland Kitchen. trimethoprim (TRIMPEX) 100 MG tablet Take 100 mg by mouth daily.     No current facility-administered medications for this visit.     Neurologic: Headache: No Seizure: No Paresthesias:No  Musculoskeletal: Strength & Muscle Tone: within normal limits Gait & Station: normal Patient leans: Front  Psychiatric Specialty Exam: Review of Systems  Cardiovascular: Negative for chest pain and palpitations.  Musculoskeletal: Positive for back pain.  Skin: Negative for rash.  Psychiatric/Behavioral: Positive for depression. Negative for suicidal ideas.    Blood pressure 140/80, height 5\' 1"  (1.549 m), weight 170 lb (77.1 kg).Body mass index is 32.12 kg/m.  General Appearance: Casual  Eye Contact:  Fair  Speech:  Normal Rate  Volume:  Decreased  Mood:  Depressed  Affect:  Congruent  Thought Process:  Goal Directed  Orientation:  Full (Time, Place, and  Person)  Thought Content:  Rumination  Suicidal Thoughts:  No  Homicidal Thoughts:  No  Memory:  Immediate;   Fair Recent;   Fair  Judgement:  Fair  Insight:  Fair  Psychomotor Activity:  Decreased  Concentration:  Concentration: Fair and Attention Span: Fair  Recall:  [employer]Fair  Fund of Knowledge:Fair  Language: Fair  Akathisia:  Negative  Handed:  Right  AIMS (if indicated):     Assets:  Desire for Improvement  ADL's:  Intact  Cognition: WNL  Sleep:  Variable to fair    Treatment Plan Summary: Medication management and Plan as follows   Depression part of Mood disorder, recurrent and relavant to pain : she has cymbalta will continue for now Lamictal 100mg  has meds Increase seroquel 150mg  for mood stabiilzation and augement as sleep aid ADHD: since she is at home and not in school or work can remain off from amphetamine. Also reviwed concerns and side effects of these medications. Has not been on it for last 3 weeks so its a good time not to restart it.  GAD: continue cymbalta Insomnia: reviewed sleep hygiene. Increase seroquel 150mg  Back condition: follow up with pain clinic. Also on nuerontin  Nicotine use: 4-5 cigarettes a day. Not ready to quit, counselling done  More than 50% time spent in counseling and coordination of care including patient education and review of side effects and individual supportive psychotherapy  . Patient is then not interested for scheduling individual psychotherapy   call 911 or report of emergency room for any urgent concerns of suicidal thoughts Follow-up in 3-4 weeks or earlier if needed   Thresa RossAKHTAR, Burnis Halling, MD 11/20/201711:43 AM

## 2016-02-25 ENCOUNTER — Ambulatory Visit (HOSPITAL_COMMUNITY): Payer: Self-pay | Admitting: Psychiatry

## 2016-03-23 ENCOUNTER — Other Ambulatory Visit (HOSPITAL_COMMUNITY): Payer: Self-pay | Admitting: Psychiatry

## 2016-03-24 ENCOUNTER — Ambulatory Visit (INDEPENDENT_AMBULATORY_CARE_PROVIDER_SITE_OTHER): Payer: 59 | Admitting: Psychiatry

## 2016-03-24 ENCOUNTER — Encounter (HOSPITAL_COMMUNITY): Payer: Self-pay | Admitting: Psychiatry

## 2016-03-24 DIAGNOSIS — G894 Chronic pain syndrome: Secondary | ICD-10-CM | POA: Diagnosis not present

## 2016-03-24 DIAGNOSIS — Z79891 Long term (current) use of opiate analgesic: Secondary | ICD-10-CM

## 2016-03-24 DIAGNOSIS — Z8249 Family history of ischemic heart disease and other diseases of the circulatory system: Secondary | ICD-10-CM

## 2016-03-24 DIAGNOSIS — F5102 Adjustment insomnia: Secondary | ICD-10-CM

## 2016-03-24 DIAGNOSIS — Z882 Allergy status to sulfonamides status: Secondary | ICD-10-CM

## 2016-03-24 DIAGNOSIS — F331 Major depressive disorder, recurrent, moderate: Secondary | ICD-10-CM | POA: Diagnosis not present

## 2016-03-24 DIAGNOSIS — Z808 Family history of malignant neoplasm of other organs or systems: Secondary | ICD-10-CM

## 2016-03-24 DIAGNOSIS — Z79899 Other long term (current) drug therapy: Secondary | ICD-10-CM

## 2016-03-24 DIAGNOSIS — Z91030 Bee allergy status: Secondary | ICD-10-CM

## 2016-03-24 DIAGNOSIS — F411 Generalized anxiety disorder: Secondary | ICD-10-CM

## 2016-03-24 DIAGNOSIS — Z888 Allergy status to other drugs, medicaments and biological substances status: Secondary | ICD-10-CM

## 2016-03-24 DIAGNOSIS — F17210 Nicotine dependence, cigarettes, uncomplicated: Secondary | ICD-10-CM

## 2016-03-24 DIAGNOSIS — Z9889 Other specified postprocedural states: Secondary | ICD-10-CM

## 2016-03-24 DIAGNOSIS — Z833 Family history of diabetes mellitus: Secondary | ICD-10-CM

## 2016-03-24 MED ORDER — BUSPIRONE HCL 10 MG PO TABS
10.0000 mg | ORAL_TABLET | Freq: Three times a day (TID) | ORAL | 1 refills | Status: DC
Start: 2016-03-24 — End: 2016-05-24

## 2016-03-24 MED ORDER — LAMOTRIGINE 100 MG PO TABS: 100.0000 mg | ORAL_TABLET | Freq: Every day | ORAL | 1 refills | Status: DC

## 2016-03-24 MED ORDER — DULOXETINE HCL 60 MG PO CPEP: 60.0000 mg | ORAL_CAPSULE | Freq: Every day | ORAL | 1 refills | Status: DC

## 2016-03-24 MED ORDER — QUETIAPINE FUMARATE 100 MG PO TABS: 150.0000 mg | ORAL_TABLET | Freq: Every day | ORAL | 1 refills | Status: DC

## 2016-03-24 NOTE — Progress Notes (Signed)
[facility]   Patient Identification: Chelsey Castro MRN:  [id number] Date of Evaluation:  03/24/2016 Referral Source: Dr/ Reche Dixonalbot.  Chief Complaint:   Chief Complaint    Follow-up     Visit Diagnosis:    ICD-9-CM ICD-10-CM   1. Moderate episode of recurrent major depressive disorder (HCC) 296.32 F33.1   2. GAD (generalized anxiety disorder) 300.02 F41.1   3. Chronic pain syndrome 338.4 G89.4   4. Adjustment insomnia 307.41 F51.02     History of Present Illness:  61 years old currently single Caucasian female living with her ex-husband initialy referred to primary care physician. She has seen Dr. Baldemar FridayLuv for psychiatry in the past last visit was around 3 months ago. Patient states to be diagnosed with mood disorder anxiety disorder recurrent depression and insomnia she also suffers from chronic back pain   Last visit we increased the Seroquel to 150 mg that has helped she continues to take Lamictal as a mood stabilizer as she feels she is having less bad O Bloss days mind is not racing Anxiety is manageable she is on BuSpar and Cymbalta We have Her off from ADHD medication since she has not used it prior to coming here and since she is mostly at home she is able to remain functional    Her depression is at a rate of 7 out of 10. 10 being no depression She endorses current anxiety worries and difficulty sleeping.   Her aggravating factors as she lost her dad 1 year ago. Difficult relocation from [place]. Chronic back pain         Past Psychiatric History: She has been on medication for the last 10 years mostly outpatient treatment one year ago in 2016 she was admitted in the hospital for depression and suicidal ideation    Past Medical History:  Past Medical History:  Diagnosis Date  . Chronic back pain   . Depression   . Diabetes (HCC)   . GERD (gastroesophageal reflux disease)   . Hyperlipemia   . OSA (obstructive sleep apnea)     Past Surgical  History:  Procedure Laterality Date  . ANKLE SURGERY    . appenedectomy    . BACK SURGERY    . BREAST LUMPECTOMY     bilateral  . GALLBLADDER SURGERY    . hysterectomy    . KNEE SURGERY      Family Psychiatric History: not know or denies  Family History:  Family History  Problem Relation Age of Onset  . Diabetes Father   . High Cholesterol Father   . Heart disease Father   . Atrial fibrillation Father   . Atrial fibrillation Mother   . Alzheimer's disease Mother   . Cancer Sister   . Heart failure Sister     Social History:   Social History   Social History  . Marital status: Single    Spouse name: N/A  . Number of children: N/A  . Years of education: N/A   Occupational History  . disabled    Social History Main Topics  . Smoking status: Current Every Day Smoker    Packs/day: 0.10    Years: 20.00    Types: Cigarettes  . Smokeless tobacco: Never Used  . Alcohol use No  . Drug use: No  . Sexual activity: Not Asked   Other Topics Concern  . None   Social History Narrative  . None     Allergies:   Allergies  Allergen Reactions  .  Bee Venom Anaphylaxis and Swelling  . Erythromycin Hives and Shortness Of Breath  . Lyrica [Pregabalin] Nausea And Vomiting and Rash  . Sulfa Antibiotics Rash  . Tramadol Rash    Metabolic Disorder Labs: No results found for: HGBA1C, MPG No results found for: PROLACTIN No results found for: CHOL, TRIG, HDL, CHOLHDL, VLDL, LDLCALC   Current Medications: Current Outpatient Prescriptions  Medication Sig Dispense Refill  . busPIRone (BUSPAR) 10 MG tablet Take 1 tablet (10 mg total) by mouth 3 (three) times daily. 90 tablet 1  . Cyanocobalamin (RA VITAMIN B-12 TR) 1000 MCG TBCR Take 1,000 mcg by mouth daily.    . cycloSPORINE (RESTASIS) 0.05 % ophthalmic emulsion Place 1 drop into both eyes 2 (two) times daily.    Marland Kitchen dexlansoprazole (DEXILANT) 60 MG capsule Take 60 mg by mouth every morning.     . diphenoxylate-atropine  (LOMOTIL) 2.5-0.025 MG per tablet Take 2 tablets by mouth 4 (four) times daily as needed for diarrhea or loose stools. (Patient not taking: Reported on 10/18/2015) 20 tablet 0  . DULoxetine (CYMBALTA) 60 MG capsule Take 1 capsule (60 mg total) by mouth daily. 30 capsule 1  . EPIPEN 2-PAK 0.3 MG/0.3ML SOAJ injection Inject 0.3 mg into the muscle once. for allergic reaction  1  . famotidine (PEPCID) 40 MG tablet Take 40 mg by mouth at bedtime.  0  . fluticasone (FLONASE) 50 MCG/ACT nasal spray Place 1 spray into both nostrils daily.    Marland Kitchen gabapentin (NEURONTIN) 300 MG capsule Take 300 mg by mouth 3 (three) times daily.    Marland Kitchen HYDROcodone-acetaminophen (NORCO/VICODIN) 5-325 MG per tablet 1 to 2 tabs every 4 to 6 hours as needed for pain. (Patient not taking: Reported on 09/14/2014) 20 tablet 0  . lamoTRIgine (LAMICTAL) 100 MG tablet Take 1 tablet (100 mg total) by mouth at bedtime. 30 tablet 1  . Linaclotide (LINZESS) 145 MCG CAPS Take 145 mcg by mouth daily.    . meloxicam (MOBIC) 15 MG tablet Take 15 mg by mouth daily.    . methocarbamol (ROBAXIN-750) 750 MG tablet Take 1 tablet (750 mg total) by mouth 4 (four) times daily. 30 tablet 0  . oxyCODONE-acetaminophen (PERCOCET/ROXICET) 5-325 MG per tablet Take 2 tablets by mouth every 4 (four) hours as needed for severe pain. (Patient taking differently: Take 2 tablets by mouth 3 (three) times daily. ) 15 tablet 0  . PROAIR HFA 108 (90 BASE) MCG/ACT inhaler Inhale 2 puffs into the lungs every 6 (six) hours as needed for wheezing or shortness of breath.   4  . QUEtiapine (SEROQUEL) 100 MG tablet Take 1.5 tablets (150 mg total) by mouth at bedtime. 45 tablet 1  . simvastatin (ZOCOR) 20 MG tablet Take 20 mg by mouth every evening.    . sucralfate (CARAFATE) 1 g tablet Take 1 g by mouth 3 (three) times daily.    Marland Kitchen trimethoprim (TRIMPEX) 100 MG tablet Take 100 mg by mouth daily.     No current facility-administered medications for this visit.       Psychiatric  Specialty Exam: Review of Systems  Gastrointestinal: Negative for nausea.  Musculoskeletal: Positive for back pain.  Skin: Negative for itching.  Psychiatric/Behavioral: Negative for suicidal ideas.    There were no vitals taken for this visit.There is no height or weight on file to calculate BMI.  General Appearance: Casual  Eye Contact:  Fair  Speech:  Normal Rate  Volume:  Decreased  Mood:  euthymic  Affect:  Congruent  Thought Process:  Goal Directed  Orientation:  Full (Time, Place, and Person)  Thought Content:  Rumination  Suicidal Thoughts:  No  Homicidal Thoughts:  No  Memory:  Immediate;   Fair Recent;   Fair  Judgement:  Fair  Insight:  Fair  Psychomotor Activity:  Decreased  Concentration:  Concentration: Fair and Attention Span: Fair  Recall:  [employer] of Knowledge:Fair  Language: Fair  Akathisia:  Negative  Handed:  Right  AIMS (if indicated):     Assets:  Desire for Improvement  ADL's:  Intact  Cognition: WNL  Sleep:  Variable to fair    Treatment Plan Summary: Medication management and Plan as follows   Depression part of Mood disorder, recurrent and relavant to pain : continue cymbalta 60mg . lamictal 100mg . seroquel 150mg  GAD: not worsened. Continue cymbalta and buspar Grief: manageable insomniaL reviewed sleep hygiene. seroquel also helps. No tremor or side effects  Back condition: follow up with pain clinic. Also on nuerontin  Nicotine use: 4-5 cigarettes a day. Not ready to quit, counselling done  More than 50% time spent in counseling and coordination of care including patient education and review of side effects and individual supportive psychotherapy  FU 2-3 months.    Thresa Ross, MD 1/12/201810:51 AM

## 2016-05-13 ENCOUNTER — Other Ambulatory Visit (HOSPITAL_COMMUNITY): Payer: Self-pay | Admitting: Psychiatry

## 2016-05-14 ENCOUNTER — Other Ambulatory Visit (HOSPITAL_COMMUNITY): Payer: Self-pay | Admitting: Psychiatry

## 2016-05-17 NOTE — Telephone Encounter (Signed)
Received fax from [facility] requesting a refill for Cymbalta and Lamictal. Per Dr. Gilmore LarocheAkhtar, refill request is denied. Pt has request refill too early. Refill was sent to pharmacy on 03/24/16 w/ 1 refill. Nothing further is needed at this time.

## 2016-05-17 NOTE — Telephone Encounter (Signed)
Received fax from [facility] requesting a refill for Seroquel. Per Dr. Gilmore LarocheAkhtar, refill request is denied. Pt has request refill too early. Refill was sent to pharmacy on 03/24/16 w/ 1 refill. Nothing further is needed at this time.

## 2016-05-24 ENCOUNTER — Other Ambulatory Visit (HOSPITAL_COMMUNITY): Payer: Self-pay | Admitting: Psychiatry

## 2016-05-25 MED ORDER — QUETIAPINE FUMARATE 100 MG PO TABS: 150.0000 mg | ORAL_TABLET | Freq: Every day | ORAL | 0 refills | Status: DC

## 2016-05-25 NOTE — Telephone Encounter (Signed)
Received fax from [facility] requesting refills for Cymbalta, Buspar, Seroquel and Lamictal. Per Dr. Akhtar, refill requests are authorize for CymbalGilmore Larocheta 60mg , #30, Buspar 10mg , #90, Lamictal 100mg , #30 and Seroquel 100mg , #45. Rx's were sent to pharmacy. Pt's next apt is schedule on 06/20/16. Called and informed pt of refill status. Pt verbalizes understanding.

## 2016-06-19 ENCOUNTER — Other Ambulatory Visit (HOSPITAL_COMMUNITY): Payer: Self-pay | Admitting: Psychiatry

## 2016-06-19 NOTE — Telephone Encounter (Signed)
Received fax from [facility] requesting a refill for Lamictal. Per Dr. Gilmore Laroche, refill request is denied. Refill was sent to pharmacy on 05/25/16. Pt will need to schedule an apt. Lvm for pt to contact office.

## 2016-06-20 ENCOUNTER — Ambulatory Visit (HOSPITAL_COMMUNITY): Payer: Self-pay | Admitting: Psychiatry

## 2016-07-26 ENCOUNTER — Encounter (HOSPITAL_COMMUNITY): Payer: Self-pay | Admitting: Psychiatry

## 2016-07-26 ENCOUNTER — Ambulatory Visit (INDEPENDENT_AMBULATORY_CARE_PROVIDER_SITE_OTHER): Payer: 59 | Admitting: Psychiatry

## 2016-07-26 VITALS — BP 116/70 | HR 87 | Resp 16 | Ht 61.0 in | Wt 176.0 lb

## 2016-07-26 DIAGNOSIS — G894 Chronic pain syndrome: Secondary | ICD-10-CM | POA: Diagnosis not present

## 2016-07-26 DIAGNOSIS — G4700 Insomnia, unspecified: Secondary | ICD-10-CM | POA: Diagnosis not present

## 2016-07-26 DIAGNOSIS — Z810 Family history of intellectual disabilities: Secondary | ICD-10-CM | POA: Diagnosis not present

## 2016-07-26 DIAGNOSIS — F411 Generalized anxiety disorder: Secondary | ICD-10-CM

## 2016-07-26 DIAGNOSIS — F331 Major depressive disorder, recurrent, moderate: Secondary | ICD-10-CM

## 2016-07-26 DIAGNOSIS — Z79899 Other long term (current) drug therapy: Secondary | ICD-10-CM | POA: Diagnosis not present

## 2016-07-26 DIAGNOSIS — F17210 Nicotine dependence, cigarettes, uncomplicated: Secondary | ICD-10-CM

## 2016-07-26 MED ORDER — BUSPIRONE HCL 10 MG PO TABS: 10.0000 mg | ORAL_TABLET | Freq: Three times a day (TID) | ORAL | 2 refills | Status: DC

## 2016-07-26 MED ORDER — LAMOTRIGINE 100 MG PO TABS
100.0000 mg | ORAL_TABLET | Freq: Every day | ORAL | 2 refills | Status: DC
Start: 2016-07-26 — End: 2016-10-20

## 2016-07-26 MED ORDER — QUETIAPINE FUMARATE 100 MG PO TABS: 150.0000 mg | ORAL_TABLET | Freq: Every day | ORAL | 2 refills | Status: DC

## 2016-07-26 MED ORDER — DULOXETINE HCL 60 MG PO CPEP: 60.0000 mg | ORAL_CAPSULE | Freq: Every day | ORAL | 2 refills | Status: DC

## 2016-07-26 NOTE — Progress Notes (Signed)
[facility] Outpatient Follow up visit   Patient Identification: Chelsey Castro MRN:  [id number] Date of Evaluation:  07/26/2016 Referral Source: Dr/ [name].  Chief Complaint:   Chief Complaint    Follow-up     Visit Diagnosis:    ICD-9-CM ICD-10-CM   1. Moderate episode of recurrent major depressive disorder (HCC) 296.32 F33.1   2. GAD (generalized anxiety disorder) 300.02 F41.1   3. Chronic pain syndrome 338.4 G89.4     History of Present Illness:  61 years old currently single Caucasian female living with her ex-husband initialy referred to primary care physician. She has seen Dr. Baldemar Friday for psychiatry in the past last visit was around 3 months ago. Patient states to be diagnosed with mood disorder anxiety disorder recurrent depression and insomnia she also suffers from chronic back pain  She has had a recent back surgery so is recovering difficult to ambulate but overall mood wise to stranded he was herself positive and balance , Seroquel and  Lamictal helps, no rash reported side effects. She takes BuSpar Cymbalta for depression and anxiety.   Her depression is at a 7/10. 10 being no depression  Sleep fair but difficult due to back pain at times  Her aggravating factors : lost dad last year. Relocation from [place]         Past Psychiatric History: She has been on medication for the last 10 years mostly outpatient treatment one year ago in 2016 she was admitted in the hospital for depression and suicidal ideation    Past Medical History:  Past Medical History:  Diagnosis Date  . Chronic back pain   . Depression   . Diabetes (HCC)   . GERD (gastroesophageal reflux disease)   . Hyperlipemia   . OSA (obstructive sleep apnea)     Past Surgical History:  Procedure Laterality Date  . ANKLE SURGERY    . appenedectomy    . BACK SURGERY    . BREAST LUMPECTOMY     bilateral  . GALLBLADDER SURGERY    . hysterectomy    . KNEE SURGERY      Family Psychiatric History: not  know or denies  Family History:  Family History  Problem Relation Age of Onset  . Diabetes Father   . High Cholesterol Father   . Heart disease Father   . Atrial fibrillation Father   . Atrial fibrillation Mother   . Alzheimer's disease Mother   . Cancer Sister   . Heart failure Sister     Social History:   Social History   Social History  . Marital status: Single    Spouse name: N/A  . Number of children: N/A  . Years of education: N/A   Occupational History  . disabled    Social History Main Topics  . Smoking status: Current Every Day Smoker    Packs/day: 0.10    Years: 20.00    Types: Cigarettes  . Smokeless tobacco: Never Used  . Alcohol use No  . Drug use: No  . Sexual activity: Not Asked   Other Topics Concern  . None   Social History Narrative  . None     Allergies:   Allergies  Allergen Reactions  . Bee Venom Anaphylaxis and Swelling  . Erythromycin Hives and Shortness Of Breath  . Lyrica [Pregabalin] Nausea And Vomiting and Rash  . Sulfa Antibiotics Rash  . Tramadol Rash    Metabolic Disorder Labs: No results found for: HGBA1C, MPG No results found for: PROLACTIN No  results found for: CHOL, TRIG, HDL, CHOLHDL, VLDL, LDLCALC   Current Medications: Current Outpatient Prescriptions  Medication Sig Dispense Refill  . busPIRone (BUSPAR) 10 MG tablet Take 1 tablet (10 mg total) by mouth 3 (three) times daily. 90 tablet 2  . Cyanocobalamin (RA VITAMIN B-12 TR) 1000 MCG TBCR Take 1,000 mcg by mouth daily.    . cycloSPORINE (RESTASIS) 0.05 % ophthalmic emulsion Place 1 drop into both eyes 2 (two) times daily.    Marland Kitchen dexlansoprazole (DEXILANT) 60 MG capsule Take 60 mg by mouth every morning.     . DULoxetine (CYMBALTA) 60 MG capsule Take 1 capsule (60 mg total) by mouth daily. 30 capsule 2  . EPIPEN 2-PAK 0.3 MG/0.3ML SOAJ injection Inject 0.3 mg into the muscle once. for allergic reaction  1  . famotidine (PEPCID) 40 MG tablet Take 40 mg by mouth at  bedtime.  0  . fluticasone (FLONASE) 50 MCG/ACT nasal spray Place 1 spray into both nostrils daily.    Marland Kitchen gabapentin (NEURONTIN) 300 MG capsule Take 300 mg by mouth 3 (three) times daily.    Marland Kitchen lamoTRIgine (LAMICTAL) 100 MG tablet Take 1 tablet (100 mg total) by mouth at bedtime. 30 tablet 2  . Linaclotide (LINZESS) 145 MCG CAPS Take 145 mcg by mouth daily.    . meloxicam (MOBIC) 15 MG tablet Take 15 mg by mouth daily.    . methocarbamol (ROBAXIN-750) 750 MG tablet Take 1 tablet (750 mg total) by mouth 4 (four) times daily. 30 tablet 0  . oxyCODONE-acetaminophen (PERCOCET/ROXICET) 5-325 MG per tablet Take 2 tablets by mouth every 4 (four) hours as needed for severe pain. (Patient taking differently: Take 2 tablets by mouth 3 (three) times daily. ) 15 tablet 0  . PROAIR HFA 108 (90 BASE) MCG/ACT inhaler Inhale 2 puffs into the lungs every 6 (six) hours as needed for wheezing or shortness of breath.   4  . QUEtiapine (SEROQUEL) 100 MG tablet Take 1.5 tablets (150 mg total) by mouth at bedtime. 45 tablet 2  . simvastatin (ZOCOR) 20 MG tablet Take 20 mg by mouth every evening.    . sucralfate (CARAFATE) 1 g tablet Take 1 g by mouth 3 (three) times daily.    Marland Kitchen trimethoprim (TRIMPEX) 100 MG tablet Take 100 mg by mouth daily.    . diphenoxylate-atropine (LOMOTIL) 2.5-0.025 MG per tablet Take 2 tablets by mouth 4 (four) times daily as needed for diarrhea or loose stools. (Patient not taking: Reported on 10/18/2015) 20 tablet 0  . HYDROcodone-acetaminophen (NORCO/VICODIN) 5-325 MG per tablet 1 to 2 tabs every 4 to 6 hours as needed for pain. (Patient not taking: Reported on 07/26/2016) 20 tablet 0   No current facility-administered medications for this visit.       Psychiatric Specialty Exam: Review of Systems  Cardiovascular: Negative for chest pain.  Gastrointestinal: Negative for nausea.  Musculoskeletal: Positive for back pain.  Skin: Negative for itching.  Psychiatric/Behavioral: Negative for  suicidal ideas.    Blood pressure 116/70, pulse 87, resp. rate 16, height 5\' 1"  (1.549 m), weight 176 lb (79.8 kg), SpO2 93 %.Body mass index is 33.25 kg/m.  General Appearance: Casual  Eye Contact:  Fair  Speech:  Normal Rate  Volume:  Decreased  Mood:  Somewhat stressed after back surgery  Affect:  congruent  Thought Process:  Goal Directed  Orientation:  Full (Time, Place, and Person)  Thought Content:  Rumination  Suicidal Thoughts:  No  Homicidal Thoughts:  No  Memory:  Immediate;   Fair Recent;   Fair  Judgement:  Fair  Insight:  Fair  Psychomotor Activity:  Decreased  Concentration:  Concentration: Fair and Attention Span: Fair  Recall:  [employer]  Fund of Knowledge:Fair  Language: Fair  Akathisia:  Negative  Handed:  Right  AIMS (if indicated):     Assets:  Desire for Improvement  ADL's:  Intact  Cognition: WNL  Sleep:  Variable to fair    Treatment Plan Summary: Medication management and Plan as follows   Depression part of Mood disorder, recurrent and relavant to pain : baseline. Continue lamictal and seroquel GAD: somewhat more due to back condition. Continue cymbalta, buspar Grief:not worse insomniaL baseline. seroquel helps.  No tremor or side effects Questions addressed, provided supportive therapy  FU 3-4 months . Renewed meds  Thresa RossAKHTAR, Caroly Purewal, MD 5/16/20189:49 AM

## 2016-08-17 ENCOUNTER — Other Ambulatory Visit (HOSPITAL_COMMUNITY): Payer: Self-pay | Admitting: Psychiatry

## 2016-08-21 ENCOUNTER — Other Ambulatory Visit (HOSPITAL_COMMUNITY): Payer: Self-pay | Admitting: Psychiatry

## 2016-08-22 NOTE — Telephone Encounter (Signed)
Received fax from [facility] requesting refills for Seroquel, Buspar, and Cymbalta. Per Dr. Akhtar, refill request is denied. Refills were sent to pharmacy on 07/26/16 for Seroquel, Buspar and Cymbalta with 2 additional refills. Patient's next apt is schedule on 11/21/16. Nothing further is need at this time. 

## 2016-08-22 NOTE — Telephone Encounter (Signed)
Received fax from [facility] requesting refills for Seroquel, Buspar, and Cymbalta. Per Dr. Gilmore LarocheAkhtar, refill request is denied. Refills were sent to pharmacy on 07/26/16 for Seroquel, Buspar and Cymbalta with 2 additional refills. Patient's next apt is schedule on 11/21/16. Nothing further is need at this time.

## 2016-09-15 ENCOUNTER — Other Ambulatory Visit (HOSPITAL_COMMUNITY): Payer: Self-pay | Admitting: Psychiatry

## 2016-09-18 NOTE — Telephone Encounter (Signed)
Medication refill-Received fax from [facility] requesting refills for Seroquel, and Cymbalta. Per Dr. Gilmore LarocheAkhtar, refill request is denied. Refills were sent to pharmacy on 07/26/16 for Seroquel and Cymbalta with 2 additional refills. Patient's next apt is schedule on 11/21/16. Nothing further is need at this time.

## 2016-09-20 IMAGING — CR DG FOOT COMPLETE 3+V*R*
3 series · 3 of 3 positions shown · non-contrast
Comparison: None.

CLINICAL DATA: Acute right foot pain and swelling after fall
yesterday.

EXAM:
RIGHT FOOT COMPLETE - 3+ VIEW

[x foot ap right]
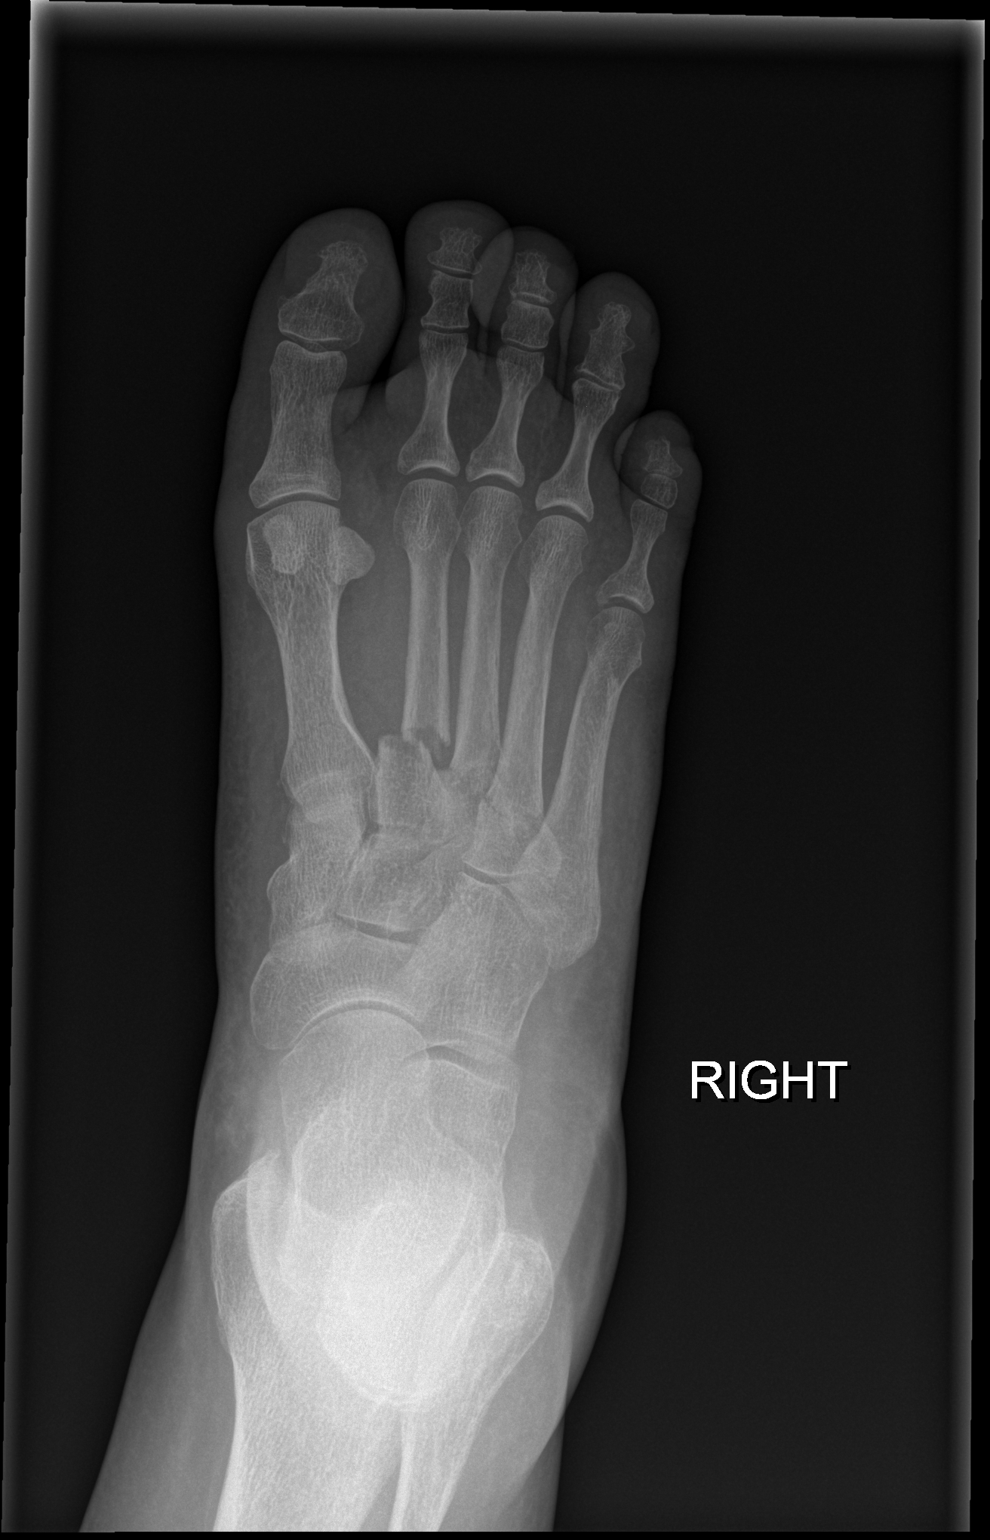

[x foot obl right]
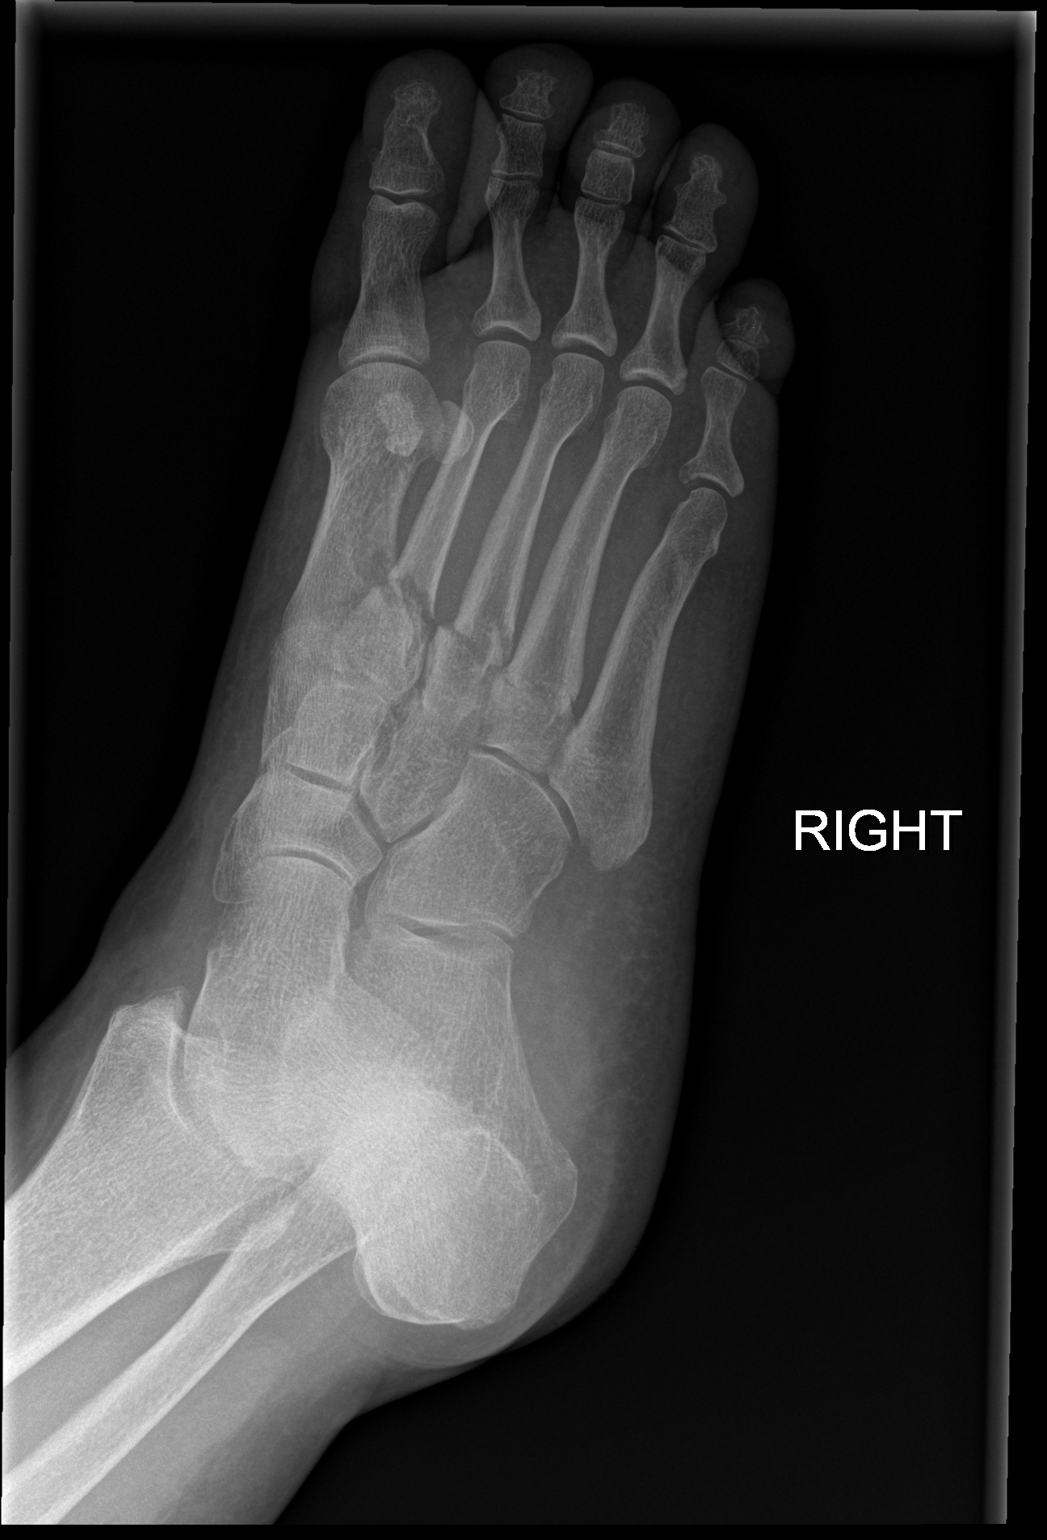

[x foot lat right]
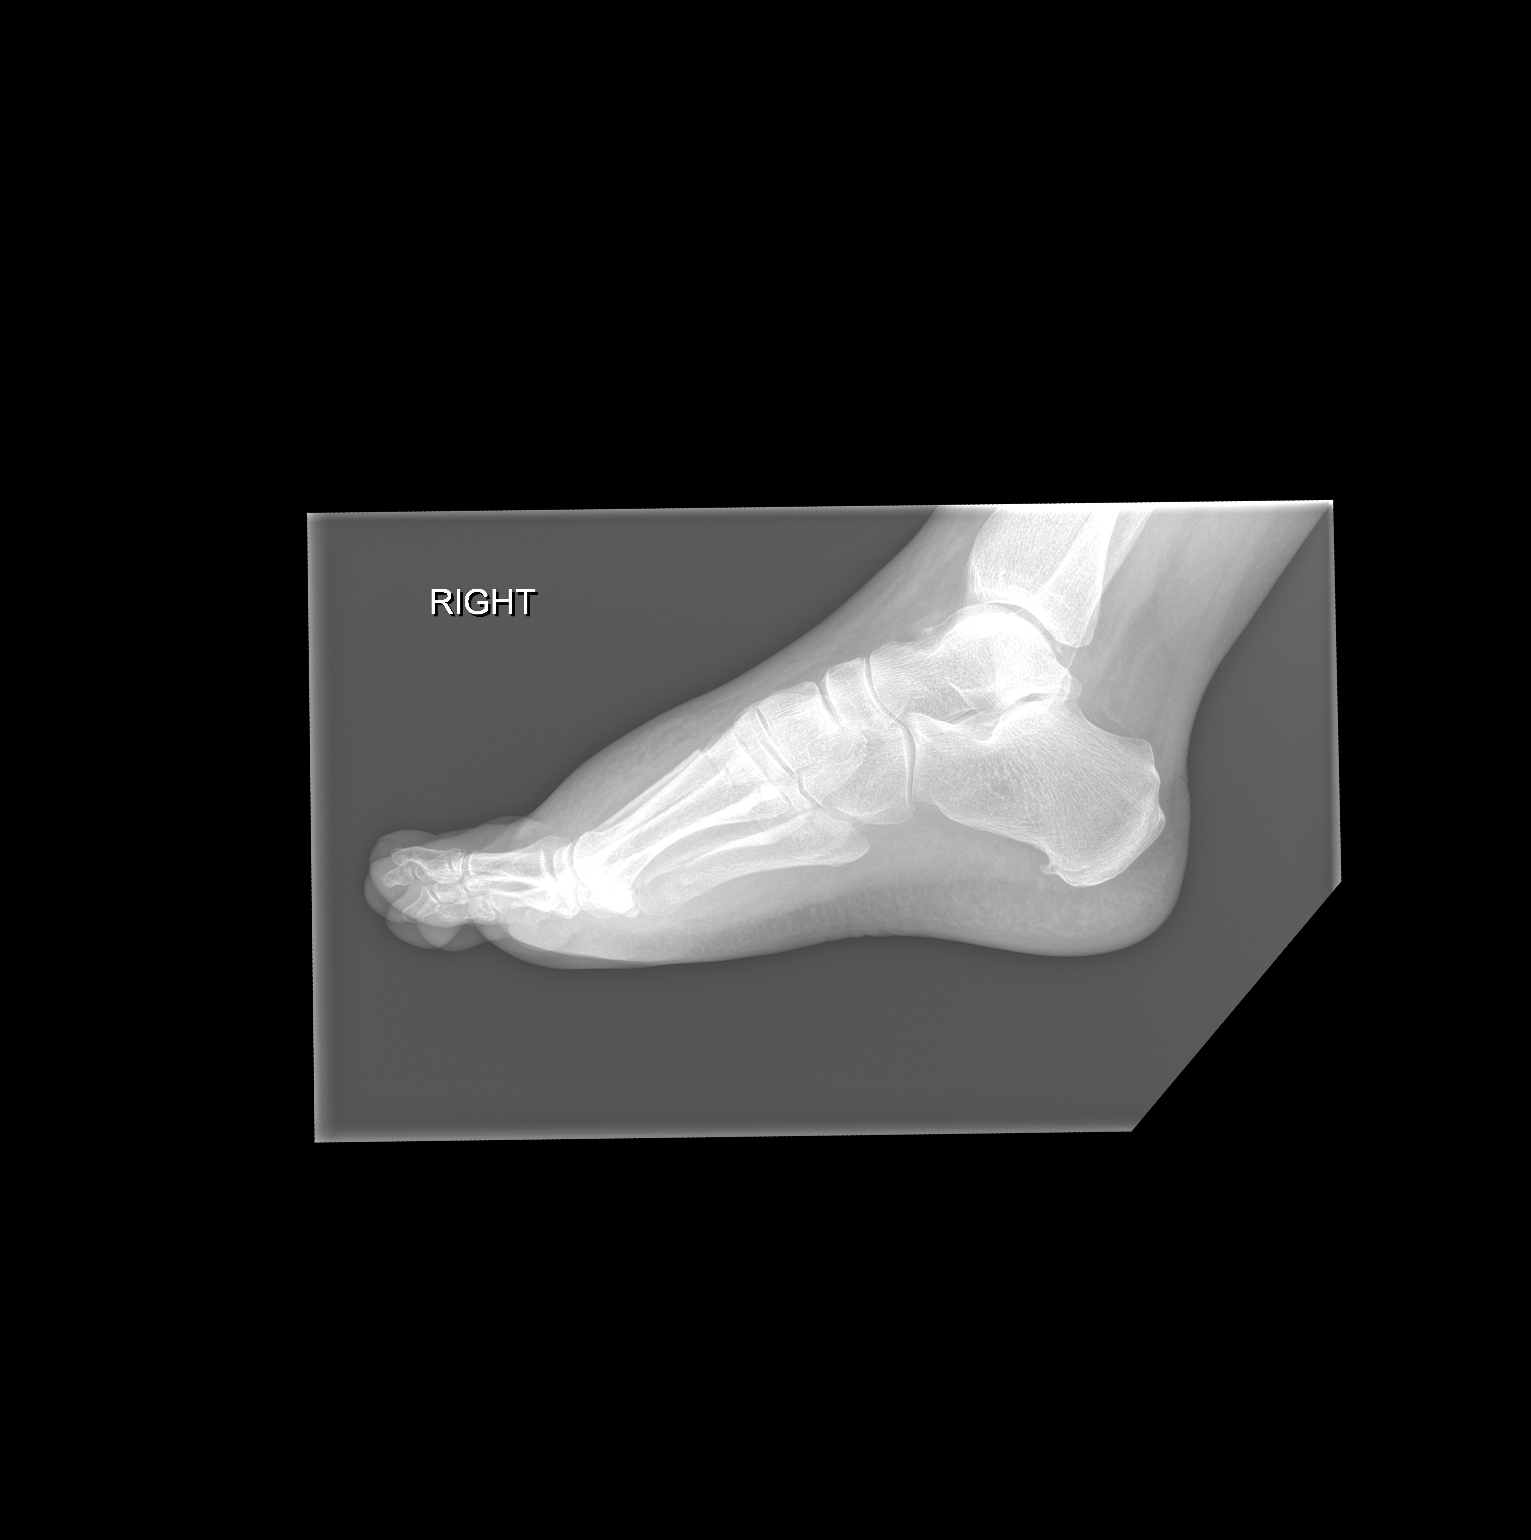

[3 of 3 positions shown; findings below may reference images not displayed]

FINDINGS: Moderately displaced oblique fracture is seen involving the proximal
second metatarsal. Mildly displaced oblique fractures are also seen
involving the proximal portions of third and fourth metatarsals.
These appear to be closed and posttraumatic. Swelling of dorsal soft
tissues is noted.
IMPRESSION: Displaced second, third and fourth metatarsals.

## 2016-10-08 ENCOUNTER — Other Ambulatory Visit (HOSPITAL_COMMUNITY): Payer: Self-pay | Admitting: Psychiatry

## 2016-10-10 NOTE — Telephone Encounter (Signed)
Medication refill- received fax from [facility] for Buspar. Per Dr. Gilmore LarocheAkhtar, refill request is denied. Refill was request too soon. Refill was sent to pharmacy on 07/26/16 w/ 2 refills. Next refill is due on 10/26/16. Pt's next apt is schedule on 11/21/16. Nothing further is need at this time.

## 2016-10-12 ENCOUNTER — Telehealth (HOSPITAL_COMMUNITY): Payer: Self-pay | Admitting: *Deleted

## 2016-10-12 ENCOUNTER — Other Ambulatory Visit (HOSPITAL_COMMUNITY): Payer: Self-pay | Admitting: Psychiatry

## 2016-10-12 NOTE — Telephone Encounter (Signed)
Medication management- pt lvm on 10/11/16. Pt was seen by pcp yesterday. PCP would like to start pt on Tenex, but pt will need to discuss if this medication with interact with psychiatric medications. Please advise.

## 2016-10-12 NOTE — Telephone Encounter (Signed)
Medication refill- received fax from [facility] for Buspar and Lamictal. Per Dr. Gilmore LarocheAkhtar, refill request is denied. Refill was request too soon. Refills was sent to pharmacy on 07/26/16 w/ 2 refills. Next refill is due on 10/26/16. Pt's next apt is schedule on 11/21/16. Nothing further is need at this time.

## 2016-10-12 NOTE — Telephone Encounter (Signed)
Can check with drug interaction table with UP to Date  Doesn't look any significant one

## 2016-10-13 NOTE — Telephone Encounter (Signed)
Return telephone call to pt. Per Dr. Gilmore LarocheAkhtar, please inform pt there are no drug interactions with Tenex. Pt verbalizes understanding.  Nothing further is need at this time.

## 2016-10-14 ENCOUNTER — Other Ambulatory Visit (HOSPITAL_COMMUNITY): Payer: Self-pay | Admitting: Psychiatry

## 2016-10-16 ENCOUNTER — Other Ambulatory Visit (HOSPITAL_COMMUNITY): Payer: Self-pay | Admitting: Psychiatry

## 2016-10-17 NOTE — Telephone Encounter (Signed)
Medication refill- received fax from [facility] for Buspar and Lamictal. Per Dr. Gilmore LarocheAkhtar, refill request is denied. Refill was request too soon. Refills was sent to pharmacy on 07/26/16 w/ 2 refills. Next refill is due on 10/26/16. Pt's next apt is schedule on 11/21/16. Nothing further is need at this time.

## 2016-10-18 NOTE — Telephone Encounter (Signed)
Medication refill- received fax from [facility] for Seroquel. Per Dr. Gilmore LarocheAkhtar, refill request is denied. Refill was request too soon. Refill was sent to pharmacy on 07/26/16 w/ 2 refills. Next refill is due on 10/26/16. Pt's next apt is schedule on 11/21/16. Nothing further is need at this time.

## 2016-10-20 ENCOUNTER — Other Ambulatory Visit (HOSPITAL_COMMUNITY): Payer: Self-pay | Admitting: Psychiatry

## 2016-10-24 NOTE — Telephone Encounter (Signed)
Medication refill- received fax from [facility] requesting refills for Buspar, Lamictal and Seroquel. Per Dr. Gilmore LarocheAkhtar, refills are authorize for Buspar 10mg , #45, Lamictal 100mg , #30 and Seroquel 100mg , #45. Rx's were sent to pharmacy. Pt's next apt is schedule on 11/21/16. Lvm informing pt of refill status.

## 2016-11-21 ENCOUNTER — Ambulatory Visit (INDEPENDENT_AMBULATORY_CARE_PROVIDER_SITE_OTHER): Payer: Medicare Other | Admitting: Psychiatry

## 2016-11-21 ENCOUNTER — Encounter (HOSPITAL_COMMUNITY): Payer: Self-pay | Admitting: Psychiatry

## 2016-11-21 VITALS — BP 126/80 | HR 78 | Resp 16 | Ht 61.0 in | Wt 189.0 lb

## 2016-11-21 DIAGNOSIS — G894 Chronic pain syndrome: Secondary | ICD-10-CM | POA: Diagnosis not present

## 2016-11-21 DIAGNOSIS — F411 Generalized anxiety disorder: Secondary | ICD-10-CM

## 2016-11-21 DIAGNOSIS — F331 Major depressive disorder, recurrent, moderate: Secondary | ICD-10-CM

## 2016-11-21 DIAGNOSIS — F17210 Nicotine dependence, cigarettes, uncomplicated: Secondary | ICD-10-CM | POA: Diagnosis not present

## 2016-11-21 DIAGNOSIS — Z810 Family history of intellectual disabilities: Secondary | ICD-10-CM | POA: Diagnosis not present

## 2016-11-21 DIAGNOSIS — F4320 Adjustment disorder, unspecified: Secondary | ICD-10-CM

## 2016-11-21 DIAGNOSIS — M549 Dorsalgia, unspecified: Secondary | ICD-10-CM | POA: Diagnosis not present

## 2016-11-21 DIAGNOSIS — G4700 Insomnia, unspecified: Secondary | ICD-10-CM | POA: Diagnosis not present

## 2016-11-21 MED ORDER — DULOXETINE HCL 60 MG PO CPEP: 60.0000 mg | ORAL_CAPSULE | Freq: Every day | ORAL | 2 refills | Status: DC

## 2016-11-21 MED ORDER — LAMOTRIGINE 100 MG PO TABS: 100.0000 mg | ORAL_TABLET | Freq: Every day | ORAL | 0 refills | Status: DC

## 2016-11-21 MED ORDER — BUSPIRONE HCL 10 MG PO TABS: 10.0000 mg | ORAL_TABLET | Freq: Three times a day (TID) | ORAL | 0 refills | Status: DC

## 2016-11-21 MED ORDER — QUETIAPINE FUMARATE 100 MG PO TABS: ORAL_TABLET | ORAL | 1 refills | Status: DC

## 2016-11-21 NOTE — Progress Notes (Signed)
[facility] Outpatient Follow up visit   Patient Identification: Chelsey Castro MRN:  [id number] Date of Evaluation:  11/21/2016 Referral Source: Dr/ [name].  Chief Complaint:   Chief Complaint    Follow-up     Visit Diagnosis:    ICD-10-CM   1. Moderate episode of recurrent major depressive disorder (HCC) F33.1   2. GAD (generalized anxiety disorder) F41.1   3. Chronic pain syndrome G89.4     History of Present Illness:  61 years old currently single Caucasian female living with her ex-husband initialy referred to primary care physician. She has seen Dr. Baldemar Friday for psychiatry in the past last visit was around 3 months ago. Patient states to be diagnosed with mood disorder anxiety disorder recurrent depression and insomnia she also suffers from chronic back pain  Has had a back surgery 4 months ago still back is stiff she is trying to do walking and keeping in position,  she started Chantix to quit smoking she was smoking 8 cigarettes now she is smoking 2 cigarettes she is feeling somewhat anxious and difficulty sleeping summary talked about sleep hygiene. Her gabapentin was increased for leg pain that is helpful helped Her depression : 7/10 Anxiety fluctuates depending on poor sleep and pain. On buspar and cymbalta    Her aggravating factors : lost dad, relocation from [place] No pyshosis Denies drug use         Past Psychiatric History: She has been on medication for the last 10 years mostly outpatient treatment one year ago in 2016 she was admitted in the hospital for depression and suicidal ideation    Past Medical History:  Past Medical History:  Diagnosis Date  . Chronic back pain   . Depression   . Diabetes (HCC)   . GERD (gastroesophageal reflux disease)   . Hyperlipemia   . OSA (obstructive sleep apnea)     Past Surgical History:  Procedure Laterality Date  . ANKLE SURGERY    . appenedectomy    . BACK SURGERY    . BREAST LUMPECTOMY     bilateral  .  GALLBLADDER SURGERY    . hysterectomy    . KNEE SURGERY      Family Psychiatric History: not know or denies  Family History:  Family History  Problem Relation Age of Onset  . Diabetes Father   . High Cholesterol Father   . Heart disease Father   . Atrial fibrillation Father   . Atrial fibrillation Mother   . Alzheimer's disease Mother   . Cancer Sister   . Heart failure Sister     Social History:   Social History   Social History  . Marital status: Single    Spouse name: N/A  . Number of children: N/A  . Years of education: N/A   Occupational History  . disabled    Social History Main Topics  . Smoking status: Current Every Day Smoker    Packs/day: 0.10    Years: 20.00    Types: Cigarettes  . Smokeless tobacco: Never Used  . Alcohol use No  . Drug use: No  . Sexual activity: Not Asked   Other Topics Concern  . None   Social History Narrative  . None     Allergies:   Allergies  Allergen Reactions  . Bee Venom Anaphylaxis and Swelling  . Erythromycin Hives and Shortness Of Breath  . Lyrica [Pregabalin] Nausea And Vomiting and Rash  . Sulfa Antibiotics Rash  . Tramadol Rash    Metabolic  Disorder Labs: No results found for: HGBA1C, MPG No results found for: PROLACTIN No results found for: CHOL, TRIG, HDL, CHOLHDL, VLDL, LDLCALC   Current Medications: Current Outpatient Prescriptions  Medication Sig Dispense Refill  . busPIRone (BUSPAR) 10 MG tablet Take 1 tablet (10 mg total) by mouth 3 (three) times daily. 90 tablet 0  . Cyanocobalamin (RA VITAMIN B-12 TR) 1000 MCG TBCR Take 1,000 mcg by mouth daily.    . cycloSPORINE (RESTASIS) 0.05 % ophthalmic emulsion Place 1 drop into both eyes 2 (two) times daily.    Marland Kitchen dexlansoprazole (DEXILANT) 60 MG capsule Take 60 mg by mouth every morning.     . diphenoxylate-atropine (LOMOTIL) 2.5-0.025 MG per tablet Take 2 tablets by mouth 4 (four) times daily as needed for diarrhea or loose stools. 20 tablet 0  .  DULoxetine (CYMBALTA) 60 MG capsule Take 1 capsule (60 mg total) by mouth daily. 30 capsule 2  . EPIPEN 2-PAK 0.3 MG/0.3ML SOAJ injection Inject 0.3 mg into the muscle once. for allergic reaction  1  . famotidine (PEPCID) 40 MG tablet Take 40 mg by mouth at bedtime.  0  . fluticasone (FLONASE) 50 MCG/ACT nasal spray Place 1 spray into both nostrils daily.    Marland Kitchen gabapentin (NEURONTIN) 300 MG capsule Take 300 mg by mouth 3 (three) times daily.    Marland Kitchen HYDROcodone-acetaminophen (NORCO/VICODIN) 5-325 MG per tablet 1 to 2 tabs every 4 to 6 hours as needed for pain. 20 tablet 0  . lamoTRIgine (LAMICTAL) 100 MG tablet Take 1 tablet (100 mg total) by mouth at bedtime. 30 tablet 0  . Linaclotide (LINZESS) 145 MCG CAPS Take 145 mcg by mouth daily.    . meloxicam (MOBIC) 15 MG tablet Take 15 mg by mouth daily.    . methocarbamol (ROBAXIN-750) 750 MG tablet Take 1 tablet (750 mg total) by mouth 4 (four) times daily. 30 tablet 0  . oxyCODONE-acetaminophen (PERCOCET/ROXICET) 5-325 MG per tablet Take 2 tablets by mouth every 4 (four) hours as needed for severe pain. (Patient taking differently: Take 2 tablets by mouth 3 (three) times daily. ) 15 tablet 0  . PROAIR HFA 108 (90 BASE) MCG/ACT inhaler Inhale 2 puffs into the lungs every 6 (six) hours as needed for wheezing or shortness of breath.   4  . QUEtiapine (SEROQUEL) 100 MG tablet TAKE 1&1/2 TABLETS (150 MG TOTAL) BY MOUTH AT BEDTIME. 45 tablet 1  . simvastatin (ZOCOR) 20 MG tablet Take 20 mg by mouth every evening.    . sucralfate (CARAFATE) 1 g tablet Take 1 g by mouth 3 (three) times daily.    Marland Kitchen trimethoprim (TRIMPEX) 100 MG tablet Take 100 mg by mouth daily.     No current facility-administered medications for this visit.       Psychiatric Specialty Exam: Review of Systems  Cardiovascular: Negative for palpitations.  Gastrointestinal: Negative for nausea.  Musculoskeletal: Positive for back pain.  Skin: Negative for itching.  Psychiatric/Behavioral:  Negative for suicidal ideas. The patient has insomnia.     Blood pressure 126/80, pulse 78, resp. rate 16, height  (1.549 m), weight 189 lb (85.7 kg), SpO2 (!) 89 %.Body mass index is 35.71 kg/m.  General Appearance: Casual  Eye Contact:  Fair  Speech:  Normal Rate  Volume:  Decreased  Mood:  stressed  Affect:  congruent  Thought Process:  Goal Directed  Orientation:  Full (Time, Place, and Person)  Thought Content:  Rumination  Suicidal Thoughts:  No  Homicidal Thoughts:  No  Memory:  Immediate;   Fair Recent;   Fair  Judgement:  Fair  Insight:  Fair  Psychomotor Activity:  Decreased  Concentration:  Concentration: Fair and Attention Span: Fair  Recall:  [employer]  Fund of Knowledge:Fair  Language: Fair  Akathisia:  Negative  Handed:  Right  AIMS (if indicated):     Assets:  Desire for Improvement  ADL's:  Intact  Cognition: WNL  Sleep:  Variable to fair    Treatment Plan Summary: Medication management and Plan as follows   Depression part of Mood disorder, recurrent and relavant to pain :fluctutes at times. Somewhat down . Continue lamictal. Primary care has increased gabapentin GAD: stressed, provided supportive therapy. Continue buspar, cymbalta Grief: baseline Insomnia: worse at times due to pain. Reviewed sleep hygiene. Continue seroquel  Questions address side effects reviewed follow-up in 2 months . Renewed prescriptions which were due. counselling done for smoking cessation.   Thresa RossAKHTAR, Chaniah Cisse, MD 9/11/20189:56 AM

## 2016-12-14 ENCOUNTER — Other Ambulatory Visit (HOSPITAL_COMMUNITY): Payer: Self-pay | Admitting: Psychiatry

## 2016-12-17 NOTE — Telephone Encounter (Signed)
Medication refill- received fax from [facility] requesting refills for Buspar and Lamictal.  Last refills were sent to pharmacy on 11/21/16. Per Dr. Gilmore Laroche, refills are authorize for Buspar , #90 and Lamictal , #30. Refills were sent to pharmacy. Pt's next apt is schedule on 01/17/17. Lvm informing pt of refill status.

## 2017-01-13 ENCOUNTER — Other Ambulatory Visit (HOSPITAL_COMMUNITY): Payer: Self-pay | Admitting: Psychiatry

## 2017-01-16 NOTE — Telephone Encounter (Signed)
Medication refill- received fax from [facility] requesting a refill for Buspirone. Per Dr. Gilmore LarocheAkhtar, refill request is denied. Pt will need to contact office for an apt. Response was faxed to pharmacy. Nothing further is need at this time.

## 2017-01-17 ENCOUNTER — Ambulatory Visit (HOSPITAL_COMMUNITY): Payer: Self-pay | Admitting: Psychiatry

## 2017-01-27 ENCOUNTER — Other Ambulatory Visit (HOSPITAL_COMMUNITY): Payer: Self-pay | Admitting: Psychiatry

## 2017-01-31 ENCOUNTER — Other Ambulatory Visit (HOSPITAL_COMMUNITY): Payer: Self-pay | Admitting: Psychiatry

## 2017-01-31 ENCOUNTER — Other Ambulatory Visit (HOSPITAL_COMMUNITY): Payer: Self-pay

## 2017-01-31 MED ORDER — QUETIAPINE FUMARATE 100 MG PO TABS: ORAL_TABLET | ORAL | 0 refills | Status: DC

## 2017-01-31 NOTE — Telephone Encounter (Signed)
Pharmacy sent fax requesting refill on Quetiapine 100mg . Refill sent in since patient was her in September and her next appointment is 02/09/17

## 2017-02-09 ENCOUNTER — Encounter (HOSPITAL_COMMUNITY): Payer: Self-pay | Admitting: Psychiatry

## 2017-02-09 ENCOUNTER — Other Ambulatory Visit (HOSPITAL_COMMUNITY): Payer: Self-pay | Admitting: Psychiatry

## 2017-02-09 ENCOUNTER — Ambulatory Visit (INDEPENDENT_AMBULATORY_CARE_PROVIDER_SITE_OTHER): Payer: Medicare Other | Admitting: Psychiatry

## 2017-02-09 ENCOUNTER — Other Ambulatory Visit: Payer: Self-pay

## 2017-02-09 VITALS — BP 140/92 | HR 92 | Ht 61.0 in | Wt 185.0 lb

## 2017-02-09 DIAGNOSIS — F5102 Adjustment insomnia: Secondary | ICD-10-CM

## 2017-02-09 DIAGNOSIS — F331 Major depressive disorder, recurrent, moderate: Secondary | ICD-10-CM

## 2017-02-09 DIAGNOSIS — Z736 Limitation of activities due to disability: Secondary | ICD-10-CM

## 2017-02-09 DIAGNOSIS — G894 Chronic pain syndrome: Secondary | ICD-10-CM | POA: Diagnosis not present

## 2017-02-09 DIAGNOSIS — F411 Generalized anxiety disorder: Secondary | ICD-10-CM

## 2017-02-09 DIAGNOSIS — F17210 Nicotine dependence, cigarettes, uncomplicated: Secondary | ICD-10-CM | POA: Diagnosis not present

## 2017-02-09 MED ORDER — DULOXETINE HCL 60 MG PO CPEP: 60.0000 mg | ORAL_CAPSULE | Freq: Every day | ORAL | 2 refills | Status: DC

## 2017-02-09 MED ORDER — LAMOTRIGINE 100 MG PO TABS: ORAL_TABLET | ORAL | 2 refills | Status: DC

## 2017-02-09 MED ORDER — BUSPIRONE HCL 10 MG PO TABS
10.0000 mg | ORAL_TABLET | Freq: Three times a day (TID) | ORAL | 1 refills | Status: DC
Start: 2017-02-09 — End: 2017-04-04

## 2017-02-09 MED ORDER — QUETIAPINE FUMARATE 100 MG PO TABS: ORAL_TABLET | ORAL | 2 refills | Status: DC

## 2017-02-09 NOTE — Progress Notes (Signed)
[facility] Outpatient Follow up visit   Patient Identification: Chelsey Castro MRN:  [id number] Date of Evaluation:  02/09/2017 Referral Source: Dr/ [name].  Chief Complaint:   Chief Complaint    Follow-up; Depression     Visit Diagnosis:    ICD-10-CM   1. Moderate episode of recurrent major depressive disorder (HCC) F33.1   2. GAD (generalized anxiety disorder) F41.1   3. Chronic pain syndrome G89.4   4. Adjustment insomnia F51.02     History of Present Illness:  61 years old currently single Caucasian female living with her ex-husband initialy referred to primary care physician. She has seen Dr. Baldemar Friday for psychiatry in the past last visit was around 3 months ago. Patient states to be diagnosed with mood disorder anxiety disorder recurrent depression and insomnia she also suffers from chronic back pain  Has multiple back surgeries in  Past. Now on higher dose of oxycontin helping pain. Mood better tolerating meds   Her depression : 7/10 Anxiety : better   Her aggravating factors : relocation. Lost Dad No pyshosis Denies drug use         Past Psychiatric History: She has been on medication for the last 10 years mostly outpatient treatment one year ago in 2016 she was admitted in the hospital for depression and suicidal ideation    Past Medical History:  Past Medical History:  Diagnosis Date  . Chronic back pain   . Depression   . Diabetes (HCC)   . GERD (gastroesophageal reflux disease)   . Hyperlipemia   . OSA (obstructive sleep apnea)     Past Surgical History:  Procedure Laterality Date  . ANKLE SURGERY    . appenedectomy    . BACK SURGERY    . BREAST LUMPECTOMY     bilateral  . GALLBLADDER SURGERY    . hysterectomy    . KNEE SURGERY      Family Psychiatric History: not know or denies  Family History:  Family History  Problem Relation Age of Onset  . Diabetes Father   . High Cholesterol Father   . Heart disease Father   . Atrial fibrillation Father    . Atrial fibrillation Mother   . Alzheimer's disease Mother   . Cancer Sister   . Heart failure Sister     Social History:   Social History   Socioeconomic History  . Marital status: Single    Spouse name: None  . Number of children: None  . Years of education: None  . Highest education level: None  Social Needs  . Financial resource strain: None  . Food insecurity - worry: None  . Food insecurity - inability: None  . Transportation needs - medical: None  . Transportation needs - non-medical: None  Occupational History  . Occupation: disabled  Tobacco Use  . Smoking status: Current Every Day Smoker    Packs/day: 0.10    Years: 20.00    Pack years: 2.00    Types: Cigarettes  . Smokeless tobacco: Never Used  Substance and Sexual Activity  . Alcohol use: No  . Drug use: No  . Sexual activity: None  Other Topics Concern  . None  Social History Narrative  . None     Allergies:   Allergies  Allergen Reactions  . Bee Venom Anaphylaxis and Swelling  . Erythromycin Hives and Shortness Of Breath  . Lyrica [Pregabalin] Nausea And Vomiting and Rash  . Sulfa Antibiotics Rash  . Tramadol Rash    Metabolic Disorder  Labs: No results found for: HGBA1C, MPG No results found for: PROLACTIN No results found for: CHOL, TRIG, HDL, CHOLHDL, VLDL, LDLCALC   Current Medications: Current Outpatient Medications  Medication Sig Dispense Refill  . busPIRone (BUSPAR) 10 MG tablet TAKE 1 TABLET BY MOUTH THREE TIMES A DAY 90 tablet 0  . Cyanocobalamin (RA VITAMIN B-12 TR) 1000 MCG TBCR Take 1,000 mcg by mouth daily.    Marland Kitchen. dexlansoprazole (DEXILANT) 60 MG capsule Take 60 mg by mouth every morning.     . DULoxetine (CYMBALTA) 60 MG capsule Take 1 capsule (60 mg total) by mouth daily. 30 capsule 2  . EPIPEN 2-PAK 0.3 MG/0.3ML SOAJ injection Inject 0.3 mg into the muscle once. for allergic reaction  1  . famotidine (PEPCID) 40 MG tablet Take 40 mg by mouth at bedtime.  0  .  fluticasone (FLONASE) 50 MCG/ACT nasal spray Place 1 spray into both nostrils daily.    Marland Kitchen. gabapentin (NEURONTIN) 300 MG capsule Take 300 mg by mouth 3 (three) times daily.    Marland Kitchen. lamoTRIgine (LAMICTAL) 100 MG tablet TAKE 1 TABLET BY MOUTH EVERYDAY AT BEDTIME 30 tablet 2  . Linaclotide (LINZESS) 145 MCG CAPS Take 145 mcg by mouth daily.    . meloxicam (MOBIC) 15 MG tablet Take 15 mg by mouth daily.    . methocarbamol (ROBAXIN-750) 750 MG tablet Take 1 tablet (750 mg total) by mouth 4 (four) times daily. 30 tablet 0  . oxyCODONE-acetaminophen (PERCOCET) 7.5-325 MG tablet     . PROAIR HFA 108 (90 BASE) MCG/ACT inhaler Inhale 2 puffs into the lungs every 6 (six) hours as needed for wheezing or shortness of breath.   4  . QUEtiapine (SEROQUEL) 100 MG tablet TAKE 1&1/2 TABLETS (150 MG TOTAL) BY MOUTH AT BEDTIME. 45 tablet 2  . simvastatin (ZOCOR) 20 MG tablet Take 20 mg by mouth every evening.    . sucralfate (CARAFATE) 1 g tablet Take 1 g by mouth 3 (three) times daily.    Marland Kitchen. trimethoprim (TRIMPEX) 100 MG tablet Take 100 mg by mouth daily.    . cycloSPORINE (RESTASIS) 0.05 % ophthalmic emulsion Place 1 drop into both eyes 2 (two) times daily.    . diphenoxylate-atropine (LOMOTIL) 2.5-0.025 MG per tablet Take 2 tablets by mouth 4 (four) times daily as needed for diarrhea or loose stools. (Patient not taking: Reported on 02/09/2017) 20 tablet 0  . HYDROcodone-acetaminophen (NORCO/VICODIN) 5-325 MG per tablet 1 to 2 tabs every 4 to 6 hours as needed for pain. (Patient not taking: Reported on 02/09/2017) 20 tablet 0  . oxyCODONE-acetaminophen (PERCOCET/ROXICET) 5-325 MG per tablet Take 2 tablets by mouth every 4 (four) hours as needed for severe pain. (Patient not taking: Reported on 02/09/2017) 15 tablet 0   No current facility-administered medications for this visit.       Psychiatric Specialty Exam: Review of Systems  Cardiovascular: Negative for chest pain.  Gastrointestinal: Negative for nausea.   Musculoskeletal: Positive for back pain.  Skin: Negative for itching.  Psychiatric/Behavioral: Negative for suicidal ideas.    Blood pressure (!) 140/92, pulse 92, height 5\' 1"  (1.549 m), weight 185 lb (83.9 kg).Body mass index is 34.96 kg/m.  General Appearance: Casual  Eye Contact:  Fair  Speech:  Normal Rate  Volume:  Decreased  Mood:  fair  Affect:  Congruent more reactive  Thought Process:  Goal Directed  Orientation:  Full (Time, Place, and Person)  Thought Content:  Rumination  Suicidal Thoughts:  No  Homicidal  Thoughts:  No  Memory:  Immediate;   Fair Recent;   Fair  Judgement:  Fair  Insight:  Fair  Psychomotor Activity:  Decreased  Concentration:  Concentration: Fair and Attention Span: Fair  Recall:  [employer]Fair  Fund of Knowledge:Fair  Language: Fair  Akathisia:  Negative  Handed:  Right  AIMS (if indicated):     Assets:  Desire for Improvement  ADL's:  Intact  Cognition: WNL  Sleep:  Variable to fair    Treatment Plan Summary: Medication management and Plan as follows   Depression part of Mood disorder, recurrent and relavant to pain :pain is better control.mood better as well. likes holidays. Continue lamictal. seroquel GAD: less stressed. Continue cymbalta Grief: baseline. Provided supportive therapy Insomnia: not worse. Continue seroquel  Questions addressed FU 3 months   Thresa RossNadeem Alicja Everitt, MD 11/30/20188:39 AM

## 2017-04-04 ENCOUNTER — Telehealth (HOSPITAL_COMMUNITY): Payer: Self-pay

## 2017-04-04 ENCOUNTER — Other Ambulatory Visit (HOSPITAL_COMMUNITY): Payer: Self-pay

## 2017-04-04 MED ORDER — BUSPIRONE HCL 10 MG PO TABS: 10.0000 mg | ORAL_TABLET | Freq: Three times a day (TID) | ORAL | 0 refills | Status: DC

## 2017-04-04 NOTE — Telephone Encounter (Signed)
Pharmacy sent over a fax requesting a 90 day supply for Buspar 10 mg. Sent over a 90 day supply. Patients' next visit is on 05/02/17. Nothing further is needed at this time.

## 2017-04-07 ENCOUNTER — Other Ambulatory Visit (HOSPITAL_COMMUNITY): Payer: Self-pay | Admitting: Psychiatry

## 2017-04-21 ENCOUNTER — Other Ambulatory Visit (HOSPITAL_COMMUNITY): Payer: Self-pay | Admitting: Psychiatry

## 2017-05-02 ENCOUNTER — Other Ambulatory Visit: Payer: Self-pay

## 2017-05-02 ENCOUNTER — Encounter (HOSPITAL_COMMUNITY): Payer: Self-pay | Admitting: Psychiatry

## 2017-05-02 ENCOUNTER — Ambulatory Visit (INDEPENDENT_AMBULATORY_CARE_PROVIDER_SITE_OTHER): Payer: Medicare Other | Admitting: Psychiatry

## 2017-05-02 VITALS — BP 132/84 | HR 94 | Ht 61.0 in | Wt 194.0 lb

## 2017-05-02 DIAGNOSIS — F331 Major depressive disorder, recurrent, moderate: Secondary | ICD-10-CM | POA: Diagnosis not present

## 2017-05-02 DIAGNOSIS — F17210 Nicotine dependence, cigarettes, uncomplicated: Secondary | ICD-10-CM

## 2017-05-02 DIAGNOSIS — M549 Dorsalgia, unspecified: Secondary | ICD-10-CM | POA: Diagnosis not present

## 2017-05-02 DIAGNOSIS — F411 Generalized anxiety disorder: Secondary | ICD-10-CM

## 2017-05-02 DIAGNOSIS — G894 Chronic pain syndrome: Secondary | ICD-10-CM | POA: Diagnosis not present

## 2017-05-02 DIAGNOSIS — Z810 Family history of intellectual disabilities: Secondary | ICD-10-CM | POA: Diagnosis not present

## 2017-05-02 DIAGNOSIS — F5102 Adjustment insomnia: Secondary | ICD-10-CM | POA: Diagnosis not present

## 2017-05-02 DIAGNOSIS — Z736 Limitation of activities due to disability: Secondary | ICD-10-CM

## 2017-05-02 DIAGNOSIS — Z634 Disappearance and death of family member: Secondary | ICD-10-CM | POA: Diagnosis not present

## 2017-05-02 MED ORDER — LAMOTRIGINE 100 MG PO TABS: ORAL_TABLET | ORAL | 2 refills | Status: DC

## 2017-05-02 MED ORDER — DULOXETINE HCL 60 MG PO CPEP: 60.0000 mg | ORAL_CAPSULE | Freq: Every day | ORAL | 2 refills | Status: DC

## 2017-05-02 MED ORDER — QUETIAPINE FUMARATE 100 MG PO TABS: ORAL_TABLET | ORAL | 2 refills | Status: DC

## 2017-05-02 NOTE — Progress Notes (Signed)
[facility]   Patient Identification: Chelsey Castro MRN:  [id number] Date of Evaluation:  05/02/2017 Referral Source: Dr/ Reche Dixonalbot.  Chief Complaint:   Chief Complaint    Follow-up; Other     Visit Diagnosis:    ICD-10-CM   1. Moderate episode of recurrent major depressive disorder (HCC) F33.1   2. GAD (generalized anxiety disorder) F41.1   3. Chronic pain syndrome G89.4   4. 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 She has seen Dr. Baldemar FridayLuv for psychiatry in the past last visit was around 3 months ago. Patient states to be diagnosed with mood disorder anxiety disorder recurrent depression and insomnia she also suffers from chronic back pain   Has multiple back surgeries in  Past. Now on higher dose of oxycontin helping pain. Mood better tolerating meds Has lost mom December 2018. Feeling tired.  Has x husband who lives with her    Her depression : 6.5/10  Anxiety : fluctuates    Her aggravating factors : relocation. Lost Dad No pyshosis Denies drug use         Past Psychiatric History: She has been on medication for the last 10 years mostly outpatient treatment one year ago in 2016 she was admitted in the hospital for depression and suicidal ideation    Past Medical History:  Past Medical History:  Diagnosis Date  . Chronic back pain   . Depression   . Diabetes (HCC)   . GERD (gastroesophageal reflux disease)   . Hyperlipemia   . OSA (obstructive sleep apnea)     Past Surgical History:  Procedure Laterality Date  . ANKLE SURGERY    . appenedectomy    . BACK SURGERY    . BREAST LUMPECTOMY     bilateral  . GALLBLADDER SURGERY    . hysterectomy    . KNEE SURGERY      Family Psychiatric History: not know or denies  Family History:  Family History  Problem Relation Age of Onset  . Diabetes Father    . High Cholesterol Father   . Heart disease Father   . Atrial fibrillation Father   . Atrial fibrillation Mother   . Alzheimer's disease Mother   . Cancer Sister   . Heart failure Sister     Social History:   Social History   Socioeconomic History  . Marital status: Single    Spouse name: None  . Number of children: None  . Years of education: None  . Highest education level: None  Social Needs  . Financial resource strain: None  . Food insecurity - worry: None  . Food insecurity - inability: None  . Transportation needs - medical: None  . Transportation needs - non-medical: None  Occupational History  . Occupation: disabled  Tobacco Use  . Smoking status: Current Every Day Smoker    Packs/day: 0.10    Years: 20.00    Pack years: 2.00    Types: Cigarettes  . Smokeless tobacco: Never Used  Substance and Sexual Activity  . Alcohol use: No  . Drug use: No  . Sexual activity: None  Other Topics Concern  . None  Social History Narrative  . None     Allergies:   Allergies  Allergen Reactions  . Bee Venom Anaphylaxis and Swelling  . Erythromycin Hives and Shortness Of Breath  . Lyrica [Pregabalin]  Nausea And Vomiting and Rash  . Sulfa Antibiotics Rash  . Tramadol Rash    Metabolic Disorder Labs: No results found for: HGBA1C, MPG No results found for: PROLACTIN No results found for: CHOL, TRIG, HDL, CHOLHDL, VLDL, LDLCALC   Current Medications: Current Outpatient Medications  Medication Sig Dispense Refill  . busPIRone (BUSPAR) 10 MG tablet Take 1 tablet (10 mg total) by mouth 3 (three) times daily. 270 tablet 0  . Cyanocobalamin (RA VITAMIN B-12 TR) 1000 MCG TBCR Take 1,000 mcg by mouth daily.    . cycloSPORINE (RESTASIS) 0.05 % ophthalmic emulsion Place 1 drop into both eyes 2 (two) times daily.    Marland Kitchen dexlansoprazole (DEXILANT) 60 MG capsule Take 60 mg by mouth every morning.     . DULoxetine (CYMBALTA) 60 MG capsule Take 1 capsule (60 mg total) by mouth  daily. 30 capsule 2  . EPIPEN 2-PAK 0.3 MG/0.3ML SOAJ injection Inject 0.3 mg into the muscle once. for allergic reaction  1  . famotidine (PEPCID) 40 MG tablet Take 40 mg by mouth at bedtime.  0  . fluticasone (FLONASE) 50 MCG/ACT nasal spray Place 1 spray into both nostrils daily.    Marland Kitchen gabapentin (NEURONTIN) 300 MG capsule Take 300 mg by mouth 3 (three) times daily.    Marland Kitchen lamoTRIgine (LAMICTAL) 100 MG tablet TAKE 1 TABLET BY MOUTH EVERYDAY AT BEDTIME 30 tablet 2  . Linaclotide (LINZESS) 145 MCG CAPS Take 145 mcg by mouth daily.    . meloxicam (MOBIC) 15 MG tablet Take 15 mg by mouth daily.    . methocarbamol (ROBAXIN-750) 750 MG tablet Take 1 tablet (750 mg total) by mouth 4 (four) times daily. 30 tablet 0  . oxyCODONE-acetaminophen (PERCOCET) 7.5-325 MG tablet     . PROAIR HFA 108 (90 BASE) MCG/ACT inhaler Inhale 2 puffs into the lungs every 6 (six) hours as needed for wheezing or shortness of breath.   4  . QUEtiapine (SEROQUEL) 100 MG tablet Take one at night 30 tablet 2  . simvastatin (ZOCOR) 20 MG tablet Take 20 mg by mouth every evening.    . sucralfate (CARAFATE) 1 g tablet Take 1 g by mouth 3 (three) times daily.    Marland Kitchen trimethoprim (TRIMPEX) 100 MG tablet Take 100 mg by mouth daily.    . diphenoxylate-atropine (LOMOTIL) 2.5-0.025 MG per tablet Take 2 tablets by mouth 4 (four) times daily as needed for diarrhea or loose stools. (Patient not taking: Reported on 02/09/2017) 20 tablet 0  . HYDROcodone-acetaminophen (NORCO/VICODIN) 5-325 MG per tablet 1 to 2 tabs every 4 to 6 hours as needed for pain. (Patient not taking: Reported on 02/09/2017) 20 tablet 0  . oxyCODONE-acetaminophen (PERCOCET/ROXICET) 5-325 MG per tablet Take 2 tablets by mouth every 4 (four) hours as needed for severe pain. (Patient not taking: Reported on 02/09/2017) 15 tablet 0   No current facility-administered medications for this visit.       Psychiatric Specialty Exam: Review of Systems  Constitutional: Positive  for malaise/fatigue.  Cardiovascular: Negative for chest pain.  Gastrointestinal: Negative for nausea.  Musculoskeletal: Positive for back pain.  Skin: Negative for itching.  Psychiatric/Behavioral: Negative for suicidal ideas.    Blood pressure 132/84, pulse 94, height 5\' 1"  (1.549 m), weight 194 lb (88 kg).Body mass index is 36.66 kg/m.  General Appearance: Casual  Eye Contact:  Fair  Speech:  Normal Rate  Volume:  Decreased  Mood:  Fair but subdued  Affect:  congruent  Thought Process:  Goal Directed  Orientation:  Full (Time, Place, and Person)  Thought Content:  Rumination  Suicidal Thoughts:  No  Homicidal Thoughts:  No  Memory:  Immediate;   Fair Recent;   Fair  Judgement:  Fair  Insight:  Fair  Psychomotor Activity:  Decreased  Concentration:  Concentration: Fair and Attention Span: Fair  Recall:  [employer] of Knowledge:Fair  Language: Fair  Akathisia:  Negative  Handed:  Right  AIMS (if indicated):     Assets:  Desire for Improvement  ADL's:  Intact  Cognition: WNL  Sleep:  Variable to fair    Treatment Plan Summary: Medication management and Plan as follows   Depression part of Mood disorder, recurrent and relavant to pain :  Recently lost mom. Continue cymbalta, lamictal GAD: increased at times. contiue buspar and cymbalta Grief: added by moms death.  Feels tired  reviewed side effects  Lower dose of seroquel to 100mg  Provided supportive therapy Insomnia: not worse. Continue seroquel but lower the dose  Questions addressed FU 3 months   Thresa Ross, MD 2/20/20199:05 AM

## 2017-06-09 ENCOUNTER — Other Ambulatory Visit (HOSPITAL_COMMUNITY): Payer: Self-pay | Admitting: Psychiatry

## 2017-07-05 ENCOUNTER — Other Ambulatory Visit (HOSPITAL_COMMUNITY): Payer: Self-pay | Admitting: Psychiatry

## 2017-07-15 ENCOUNTER — Other Ambulatory Visit (HOSPITAL_COMMUNITY): Payer: Self-pay | Admitting: Psychiatry

## 2017-07-24 ENCOUNTER — Encounter (HOSPITAL_COMMUNITY): Payer: Self-pay | Admitting: Psychiatry

## 2017-07-24 ENCOUNTER — Ambulatory Visit (INDEPENDENT_AMBULATORY_CARE_PROVIDER_SITE_OTHER): Payer: Medicare Other | Admitting: Psychiatry

## 2017-07-24 ENCOUNTER — Other Ambulatory Visit: Payer: Self-pay

## 2017-07-24 VITALS — BP 130/100 | HR 84 | Ht 61.0 in | Wt 191.0 lb

## 2017-07-24 DIAGNOSIS — Z87891 Personal history of nicotine dependence: Secondary | ICD-10-CM

## 2017-07-24 DIAGNOSIS — G894 Chronic pain syndrome: Secondary | ICD-10-CM | POA: Diagnosis not present

## 2017-07-24 DIAGNOSIS — F5102 Adjustment insomnia: Secondary | ICD-10-CM | POA: Diagnosis not present

## 2017-07-24 DIAGNOSIS — Z810 Family history of intellectual disabilities: Secondary | ICD-10-CM | POA: Diagnosis not present

## 2017-07-24 DIAGNOSIS — M549 Dorsalgia, unspecified: Secondary | ICD-10-CM | POA: Diagnosis not present

## 2017-07-24 DIAGNOSIS — F411 Generalized anxiety disorder: Secondary | ICD-10-CM | POA: Diagnosis not present

## 2017-07-24 DIAGNOSIS — F331 Major depressive disorder, recurrent, moderate: Secondary | ICD-10-CM | POA: Diagnosis not present

## 2017-07-24 MED ORDER — QUETIAPINE FUMARATE 100 MG PO TABS: ORAL_TABLET | ORAL | 2 refills | Status: DC

## 2017-07-24 MED ORDER — BUSPIRONE HCL 30 MG PO TABS: ORAL_TABLET | ORAL | 1 refills | Status: DC

## 2017-07-24 MED ORDER — LAMOTRIGINE 100 MG PO TABS
ORAL_TABLET | ORAL | 2 refills | Status: DC
Start: 2017-07-24 — End: 2017-10-08

## 2017-07-24 MED ORDER — DULOXETINE HCL 60 MG PO CPEP: 60.0000 mg | ORAL_CAPSULE | Freq: Every day | ORAL | 2 refills | Status: DC

## 2017-07-24 NOTE — Progress Notes (Signed)
[facility] Outpatient Follow up visit   Patient Identification: Chelsey Castro MRN:  [id number] Date of Evaluation:  07/24/2017 Referral Source: Dr/ [name].  Chief Complaint:   Chief Complaint    Follow-up; Other     Visit Diagnosis:    ICD-10-CM   1. Moderate episode of recurrent major depressive disorder (HCC) F33.1   2. GAD (generalized anxiety disorder) F41.1   3. Chronic pain syndrome G89.4   4. 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 She has seen Dr. Baldemar Friday for psychiatry in the past last visit was around 3 months ago. Patient states to be diagnosed with mood disorder anxiety disorder recurrent depression and insomnia she also suffers from chronic back pain   Has multiple back surgeries in  Past. Now on higher dose of oxycontin helping pain. Pain can effect mood Last her mom in December had alzheimers Recent bladder infection was stressful had to be in hospital for one week Has x husband who lives with her    Her depression : 6/10 Tolerating meds. Feels not to change them  Anxiety : fluctuates    Her aggravating factors : relocation.      Past Medical History:  Past Medical History:  Diagnosis Date  . Chronic back pain   . Depression   . Diabetes (HCC)   . GERD (gastroesophageal reflux disease)   . Hyperlipemia   . OSA (obstructive sleep apnea)     Past Surgical History:  Procedure Laterality Date  . ANKLE SURGERY    . appenedectomy    . BACK SURGERY    . BREAST LUMPECTOMY     bilateral  . GALLBLADDER SURGERY    . hysterectomy    . KNEE SURGERY      Family Psychiatric History: not know or denies  Family History:  Family History  Problem Relation Age of Onset  . Diabetes Father   . High Cholesterol Father   . Heart disease Father   . Atrial fibrillation Father   . Atrial fibrillation Mother   . Alzheimer's  disease Mother   . Cancer Sister   . Heart failure Sister     Social History:   Social History   Socioeconomic History  . Marital status: Single    Spouse name: Not on file  . Number of children: Not on file  . Years of education: Not on file  . Highest education level: Not on file  Occupational History  . Occupation: disabled  Social Needs  . Financial resource strain: Not on file  . Food insecurity:    Worry: Not on file    Inability: Not on file  . Transportation needs:    Medical: Not on file    Non-medical: Not on file  Tobacco Use  . Smoking status: Former Smoker    Packs/day: 0.10    Years: 20.00    Pack years: 2.00    Types: Cigarettes  . Smokeless tobacco: Never Used  . Tobacco comment: quit 3 months ago  Substance and Sexual Activity  . Alcohol use: No  . Drug use: No  . Sexual activity: Not on file  Lifestyle  . Physical activity:    Days per week: Not on file    Minutes per session: Not on file  . Stress: Not on file  Relationships  . Social connections:    Talks on phone: Not on  file    Gets together: Not on file    Attends religious service: Not on file    Active member of club or organization: Not on file    Attends meetings of clubs or organizations: Not on file    Relationship status: Not on file  Other Topics Concern  . Not on file  Social History Narrative  . Not on file     Allergies:   Allergies  Allergen Reactions  . Bee Venom Anaphylaxis and Swelling  . Erythromycin Hives and Shortness Of Breath  . Lyrica [Pregabalin] Nausea And Vomiting and Rash  . Sulfa Antibiotics Rash  . Tramadol Rash    Metabolic Disorder Labs: No results found for: HGBA1C, MPG No results found for: PROLACTIN No results found for: CHOL, TRIG, HDL, CHOLHDL, VLDL, LDLCALC   Current Medications: Current Outpatient Medications  Medication Sig Dispense Refill  . busPIRone (BUSPAR) 30 MG tablet Three times a day 90 tablet 1  . Cyanocobalamin (RA VITAMIN  B-12 TR) 1000 MCG TBCR Take 1,000 mcg by mouth daily.    . cycloSPORINE (RESTASIS) 0.05 % ophthalmic emulsion Place 1 drop into both eyes 2 (two) times daily.    Marland Kitchen dexlansoprazole (DEXILANT) 60 MG capsule Take 60 mg by mouth every morning.     . DULoxetine (CYMBALTA) 60 MG capsule Take 1 capsule (60 mg total) by mouth daily. 30 capsule 2  . EPIPEN 2-PAK 0.3 MG/0.3ML SOAJ injection Inject 0.3 mg into the muscle once. for allergic reaction  1  . famotidine (PEPCID) 40 MG tablet Take 40 mg by mouth at bedtime.  0  . fluticasone (FLONASE) 50 MCG/ACT nasal spray Place 1 spray into both nostrils daily.    Marland Kitchen gabapentin (NEURONTIN) 300 MG capsule Take 300 mg by mouth 3 (three) times daily.    Marland Kitchen HYDROcodone-acetaminophen (NORCO/VICODIN) 5-325 MG per tablet 1 to 2 tabs every 4 to 6 hours as needed for pain. 20 tablet 0  . lamoTRIgine (LAMICTAL) 100 MG tablet TAKE 1 TABLET BY MOUTH EVERYDAY AT BEDTIME 30 tablet 2  . Linaclotide (LINZESS) 145 MCG CAPS Take 145 mcg by mouth daily.    . meloxicam (MOBIC) 15 MG tablet Take 15 mg by mouth daily.    . methocarbamol (ROBAXIN-750) 750 MG tablet Take 1 tablet (750 mg total) by mouth 4 (four) times daily. 30 tablet 0  . oxyCODONE-acetaminophen (PERCOCET) 7.5-325 MG tablet     . oxyCODONE-acetaminophen (PERCOCET/ROXICET) 5-325 MG per tablet Take 2 tablets by mouth every 4 (four) hours as needed for severe pain. 15 tablet 0  . PROAIR HFA 108 (90 BASE) MCG/ACT inhaler Inhale 2 puffs into the lungs every 6 (six) hours as needed for wheezing or shortness of breath.   4  . QUEtiapine (SEROQUEL) 100 MG tablet Take one at night 30 tablet 2  . simvastatin (ZOCOR) 20 MG tablet Take 20 mg by mouth every evening.    . sucralfate (CARAFATE) 1 g tablet Take 1 g by mouth 3 (three) times daily.    Marland Kitchen trimethoprim (TRIMPEX) 100 MG tablet Take 100 mg by mouth daily.    . diphenoxylate-atropine (LOMOTIL) 2.5-0.025 MG per tablet Take 2 tablets by mouth 4 (four) times daily as needed for  diarrhea or loose stools. (Patient not taking: Reported on 07/24/2017) 20 tablet 0   No current facility-administered medications for this visit.       Psychiatric Specialty Exam: Review of Systems  Constitutional: Positive for malaise/fatigue.  Cardiovascular: Negative for chest pain.  Gastrointestinal: Negative for nausea.  Musculoskeletal: Positive for back pain.  Skin: Negative for itching.  Psychiatric/Behavioral: Negative for substance abuse and suicidal ideas.    Blood pressure (!) 130/100, pulse 84, height  (1.549 m), weight 191 lb (86.6 kg).Body mass index is 36.09 kg/m.  General Appearance: Casual  Eye Contact:  Fair  Speech:  Normal Rate  Volume:  Decreased  Mood:  subdued  Affect:  congruent  Thought Process:  Goal Directed  Orientation:  Full (Time, Place, and Person)  Thought Content:  Rumination  Suicidal Thoughts:  No  Homicidal Thoughts:  No  Memory:  Immediate;   Fair Recent;   Fair  Judgement:  Fair  Insight:  Fair  Psychomotor Activity:  Decreased  Concentration:  Concentration: Fair and Attention Span: Fair  Recall:  [employer] of Knowledge:Fair  Language: Fair  Akathisia:  Negative  Handed:  Right  AIMS (if indicated):     Assets:  Desire for Improvement  ADL's:  Intact  Cognition: WNL  Sleep:  Variable to fair    Treatment Plan Summary: Medication management and Plan as follows   Depression part of Mood disorder, recurrent and relavant to pain :  Recently lost mom. Have cut down seroquel due to tiredness last visit she does not want to go more lower  not tremors Subdued but not worse. Continue lamictal and meds  GAD: fair. Continue buspar and cymbalta   Insomnia: not worse. Continue seroquel Fu 80m.  Thresa Ross, MD 5/14/201910:01 AM

## 2017-09-25 ENCOUNTER — Other Ambulatory Visit (HOSPITAL_COMMUNITY): Payer: Self-pay | Admitting: Psychiatry

## 2017-09-30 ENCOUNTER — Other Ambulatory Visit (HOSPITAL_COMMUNITY): Payer: Self-pay | Admitting: Psychiatry

## 2017-10-05 ENCOUNTER — Other Ambulatory Visit (HOSPITAL_COMMUNITY): Payer: Self-pay | Admitting: Psychiatry

## 2017-10-08 ENCOUNTER — Other Ambulatory Visit (HOSPITAL_COMMUNITY): Payer: Self-pay | Admitting: Psychiatry

## 2017-10-27 ENCOUNTER — Other Ambulatory Visit (HOSPITAL_COMMUNITY): Payer: Self-pay | Admitting: Psychiatry

## 2017-11-07 ENCOUNTER — Telehealth (HOSPITAL_COMMUNITY): Payer: Self-pay | Admitting: Psychiatry

## 2017-11-20 ENCOUNTER — Encounter (HOSPITAL_COMMUNITY): Payer: Self-pay | Admitting: Psychiatry

## 2017-11-20 ENCOUNTER — Ambulatory Visit (INDEPENDENT_AMBULATORY_CARE_PROVIDER_SITE_OTHER): Payer: Medicare Other | Admitting: Psychiatry

## 2017-11-20 VITALS — BP 142/98 | HR 91 | Ht 61.0 in | Wt 193.0 lb

## 2017-11-20 DIAGNOSIS — F411 Generalized anxiety disorder: Secondary | ICD-10-CM | POA: Diagnosis not present

## 2017-11-20 DIAGNOSIS — G894 Chronic pain syndrome: Secondary | ICD-10-CM | POA: Diagnosis not present

## 2017-11-20 DIAGNOSIS — Z87891 Personal history of nicotine dependence: Secondary | ICD-10-CM

## 2017-11-20 DIAGNOSIS — F5102 Adjustment insomnia: Secondary | ICD-10-CM

## 2017-11-20 DIAGNOSIS — Z736 Limitation of activities due to disability: Secondary | ICD-10-CM

## 2017-11-20 DIAGNOSIS — F331 Major depressive disorder, recurrent, moderate: Secondary | ICD-10-CM

## 2017-11-20 DIAGNOSIS — Z810 Family history of intellectual disabilities: Secondary | ICD-10-CM

## 2017-11-20 MED ORDER — QUETIAPINE FUMARATE 100 MG PO TABS: ORAL_TABLET | ORAL | 1 refills | Status: DC

## 2017-11-20 MED ORDER — DULOXETINE HCL 40 MG PO CPEP: 40.0000 mg | ORAL_CAPSULE | Freq: Every day | ORAL | 1 refills | Status: DC

## 2017-11-20 MED ORDER — LAMOTRIGINE 100 MG PO TABS: ORAL_TABLET | ORAL | 0 refills | Status: DC

## 2017-11-20 NOTE — Progress Notes (Signed)
[facility] Outpatient Follow up visit   Patient Identification: Chelsey Castro MRN:  [id number] Date of Evaluation:  11/20/2017 Referral Source: Dr/ [name].  Chief Complaint:   Chief Complaint    Follow-up; Medication Refill     Visit Diagnosis:    ICD-10-CM   1. Moderate episode of recurrent major depressive disorder (HCC) F33.1   2. GAD (generalized anxiety disorder) F41.1   3. Chronic pain syndrome G89.4   4. 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 She has seen Dr. Baldemar Friday for psychiatry in the past     Has multiple back surgeries in  Past. Now on higher dose of oxycontin helping pain. Pain can effect mood Last her mom in December had alzheimers Doing fair on meds, talked about weight , she understands multiple medicaions can cause weight gain, she wants to work on lowering cymbalta, understands she is on seroquel, gaba pentin also can cause weight Sugar was running high, now metformin changed that has helped   Has x husband who lives with her    Her depression : 6/10  Anxiety : fluctuates    Her aggravating factors : relocation, mom death      Past Medical History:  Past Medical History:  Diagnosis Date  . Chronic back pain   . Depression   . Diabetes (HCC)   . GERD (gastroesophageal reflux disease)   . Hyperlipemia   . OSA (obstructive sleep apnea)     Past Surgical History:  Procedure Laterality Date  . ANKLE SURGERY    . appenedectomy    . BACK SURGERY    . BREAST LUMPECTOMY     bilateral  . GALLBLADDER SURGERY    . hysterectomy    . KNEE SURGERY      Family Psychiatric History: not know or denies  Family History:  Family History  Problem Relation Age of Onset  . Diabetes Father   . High Cholesterol Father   . Heart disease Father   . Atrial fibrillation Father   . Atrial fibrillation Mother   .  Alzheimer's disease Mother   . Cancer Sister   . Heart failure Sister     Social History:   Social History   Socioeconomic History  . Marital status: Single    Spouse name: Not on file  . Number of children: Not on file  . Years of education: Not on file  . Highest education level: Not on file  Occupational History  . Occupation: disabled  Social Needs  . Financial resource strain: Not on file  . Food insecurity:    Worry: Not on file    Inability: Not on file  . Transportation needs:    Medical: Not on file    Non-medical: Not on file  Tobacco Use  . Smoking status: Former Smoker    Packs/day: 0.10    Years: 20.00    Pack years: 2.00    Types: Cigarettes  . Smokeless tobacco: Never Used  . Tobacco comment: quit 3 months ago  Substance and Sexual Activity  . Alcohol use: No  . Drug use: No  . Sexual activity: Not on file  Lifestyle  . Physical activity:    Days per week: Not on file    Minutes per session: Not on file  . Stress: Not on file  Relationships  . Social connections:    Talks on  phone: Not on file    Gets together: Not on file    Attends religious service: Not on file    Active member of club or organization: Not on file    Attends meetings of clubs or organizations: Not on file    Relationship status: Not on file  Other Topics Concern  . Not on file  Social History Narrative  . Not on file     Allergies:   Allergies  Allergen Reactions  . Bee Venom Anaphylaxis and Swelling  . Erythromycin Hives and Shortness Of Breath  . Lyrica [Pregabalin] Nausea And Vomiting and Rash  . Sulfa Antibiotics Rash  . Tramadol Rash    Metabolic Disorder Labs: No results found for: HGBA1C, MPG No results found for: PROLACTIN No results found for: CHOL, TRIG, HDL, CHOLHDL, VLDL, LDLCALC   Current Medications: Current Outpatient Medications  Medication Sig Dispense Refill  . busPIRone (BUSPAR) 30 MG tablet TAKE 1/3 TABLET BY MOUTH THREE TIMES A DAY 90  tablet 0  . cycloSPORINE (RESTASIS) 0.05 % ophthalmic emulsion Place 1 drop into both eyes 2 (two) times daily.    Marland Kitchen dexlansoprazole (DEXILANT) 60 MG capsule Take 60 mg by mouth every morning.     . diphenoxylate-atropine (LOMOTIL) 2.5-0.025 MG per tablet Take 2 tablets by mouth 4 (four) times daily as needed for diarrhea or loose stools. 20 tablet 0  . DULoxetine 40 MG CPEP Take 40 mg by mouth daily. 30 capsule 1  . EPIPEN 2-PAK 0.3 MG/0.3ML SOAJ injection Inject 0.3 mg into the muscle once. for allergic reaction  1  . famotidine (PEPCID) 40 MG tablet Take 40 mg by mouth at bedtime.  0  . fluticasone (FLONASE) 50 MCG/ACT nasal spray Place 1 spray into both nostrils daily.    Marland Kitchen gabapentin (NEURONTIN) 300 MG capsule Take 300 mg by mouth 3 (three) times daily.    Marland Kitchen HYDROcodone-acetaminophen (NORCO/VICODIN) 5-325 MG per tablet 1 to 2 tabs every 4 to 6 hours as needed for pain. 20 tablet 0  . lamoTRIgine (LAMICTAL) 100 MG tablet One a day 90 tablet 0  . Linaclotide (LINZESS) 145 MCG CAPS Take 145 mcg by mouth daily.    . meloxicam (MOBIC) 15 MG tablet Take 15 mg by mouth daily.    . methocarbamol (ROBAXIN-750) 750 MG tablet Take 1 tablet (750 mg total) by mouth 4 (four) times daily. 30 tablet 0  . oxyCODONE-acetaminophen (PERCOCET) 7.5-325 MG tablet     . oxyCODONE-acetaminophen (PERCOCET/ROXICET) 5-325 MG per tablet Take 2 tablets by mouth every 4 (four) hours as needed for severe pain. 15 tablet 0  . PROAIR HFA 108 (90 BASE) MCG/ACT inhaler Inhale 2 puffs into the lungs every 6 (six) hours as needed for wheezing or shortness of breath.   4  . QUEtiapine (SEROQUEL) 100 MG tablet TAKE 1 TABLET BY MOUTH EVERY DAY AT NIGHT 30 tablet 1  . simvastatin (ZOCOR) 20 MG tablet Take 20 mg by mouth every evening.    . sucralfate (CARAFATE) 1 g tablet Take 1 g by mouth 3 (three) times daily.    Marland Kitchen trimethoprim (TRIMPEX) 100 MG tablet Take 100 mg by mouth daily.    . Cyanocobalamin (RA VITAMIN B-12 TR) 1000 MCG  TBCR Take 1,000 mcg by mouth daily.     No current facility-administered medications for this visit.       Psychiatric Specialty Exam: Review of Systems  Constitutional: Positive for malaise/fatigue.  Cardiovascular: Negative for palpitations.  Gastrointestinal: Negative for  nausea.  Musculoskeletal: Positive for back pain.  Skin: Negative for itching.  Psychiatric/Behavioral: Negative for substance abuse and suicidal ideas.    Blood pressure (!) 142/98, pulse 91, height 5\' 1"  (1.549 m), weight 193 lb (87.5 kg), SpO2 93 %.Body mass index is 36.47 kg/m.  General Appearance: Casual  Eye Contact:  Fair  Speech:  Normal Rate  Volume:  Decreased  Mood:  fair  Affect:  congruent  Thought Process:  Goal Directed  Orientation:  Full (Time, Place, and Person)  Thought Content:  Rumination  Suicidal Thoughts:  No  Homicidal Thoughts:  No  Memory:  Immediate;   Fair Recent;   Fair  Judgement:  Fair  Insight:  Fair  Psychomotor Activity:  Decreased  Concentration:  Concentration: Fair and Attention Span: Fair  Recall:  [employer] of Knowledge:Fair  Language: Fair  Akathisia:  Negative  Handed:  Right  AIMS (if indicated):     Assets:  Desire for Improvement  ADL's:  Intact  Cognition: WNL  Sleep:  Variable to fair    Treatment Plan Summary: Medication management and Plan as follows   Depression part of Mood disorder, recurrent and relavant to pain :  Doing fair. Wants to lower cymbalta will change to 40mg .  Cautioned other meds can cause weight gain, she wants to continue seroquel, lamictal. Will continue No rash  GAD: fair. Continue cymbalta, lower dose to 40mg . Has buspar for now   Insomnia: not worse. Continue seroquel. No tremors Fu 14m.  Thresa Ross, MD 9/10/201910:08 AM

## 2017-12-12 ENCOUNTER — Other Ambulatory Visit (HOSPITAL_COMMUNITY): Payer: Self-pay | Admitting: Psychiatry

## 2017-12-13 ENCOUNTER — Other Ambulatory Visit (HOSPITAL_COMMUNITY): Payer: Self-pay | Admitting: Psychiatry

## 2017-12-17 ENCOUNTER — Other Ambulatory Visit (HOSPITAL_COMMUNITY): Payer: Self-pay | Admitting: Psychiatry

## 2017-12-21 ENCOUNTER — Other Ambulatory Visit (HOSPITAL_COMMUNITY): Payer: Self-pay | Admitting: Psychiatry

## 2018-01-09 ENCOUNTER — Other Ambulatory Visit (HOSPITAL_COMMUNITY): Payer: Self-pay | Admitting: Psychiatry

## 2018-02-19 ENCOUNTER — Ambulatory Visit (HOSPITAL_COMMUNITY): Payer: Self-pay | Admitting: Psychiatry

## 2018-02-22 ENCOUNTER — Encounter (HOSPITAL_COMMUNITY): Payer: Self-pay | Admitting: Psychiatry

## 2018-02-22 ENCOUNTER — Ambulatory Visit (INDEPENDENT_AMBULATORY_CARE_PROVIDER_SITE_OTHER): Payer: Medicare Other | Admitting: Psychiatry

## 2018-02-22 DIAGNOSIS — G894 Chronic pain syndrome: Secondary | ICD-10-CM | POA: Diagnosis not present

## 2018-02-22 DIAGNOSIS — F411 Generalized anxiety disorder: Secondary | ICD-10-CM | POA: Diagnosis not present

## 2018-02-22 DIAGNOSIS — F331 Major depressive disorder, recurrent, moderate: Secondary | ICD-10-CM

## 2018-02-22 MED ORDER — LAMOTRIGINE 100 MG PO TABS: ORAL_TABLET | ORAL | 0 refills | Status: DC

## 2018-02-22 MED ORDER — BUSPIRONE HCL 30 MG PO TABS: ORAL_TABLET | ORAL | 0 refills | Status: DC

## 2018-02-22 MED ORDER — QUETIAPINE FUMARATE 100 MG PO TABS: ORAL_TABLET | ORAL | 2 refills | Status: DC

## 2018-02-22 MED ORDER — DULOXETINE HCL 20 MG PO CPEP: 40.0000 mg | ORAL_CAPSULE | Freq: Every day | ORAL | 2 refills | Status: DC

## 2018-02-22 NOTE — Progress Notes (Signed)
[facility] Out[facility]   Patient Identification: Chelsey JesterKathleen Castro MRN:  [id number] Date of Evaluation:  02/22/2018 Referral Source: Dr/ Reche Dixonalbot.  Chief Complaint:   Chief Complaint    Follow-up; Medication Refill     Visit Diagnosis:    ICD-10-CM   1. Moderate episode of recurrent major depressive disorder (HCC) F33.1   2. GAD (generalized anxiety disorder) F41.1   3. Chronic pain syndrome G89.4     History of Present Illness:  62 years old currently single Caucasian female living with her ex-husband initialy referred to primary care physician. She has seen Dr. Baldemar FridayLuv for psychiatry in the past     Has multiple back surgeries in  Past. On oxycontin that helps, otherwise  Pain effect mood Last her mom in December had alzheimers. Some concern over the holiday anniversary [place] Last visit cut down cymbalta to 40mg  doing fair.   Doing fair on meds, talked about weight , she understands multiple medicaions can cause weight gain,    Has x husband who lives with her  Her depression : 6/10  Anxiety : fluctuates    Her aggravating factors : relocation, mom death      Past Medical History:  Past Medical History:  Diagnosis Date  . Chronic back pain   . Depression   . Diabetes (HCC)   . GERD (gastroesophageal reflux disease)   . Hyperlipemia   . OSA (obstructive sleep apnea)     Past Surgical History:  Procedure Laterality Date  . ANKLE SURGERY    . appenedectomy    . BACK SURGERY    . BREAST LUMPECTOMY     bilateral  . GALLBLADDER SURGERY    . hysterectomy    . KNEE SURGERY      Family Psychiatric History: not know or denies  Family History:  Family History  Problem Relation Age of Onset  . Diabetes Father   . High Cholesterol Father   . Heart disease Father   . Atrial fibrillation Father   . Atrial fibrillation Mother   . Alzheimer's disease Mother   . Cancer Sister   . Heart failure Sister     Social History:    Social History   Socioeconomic History  . Marital status: Single    Spouse name: Not on file  . Number of children: Not on file  . Years of education: Not on file  . Highest education level: Not on file  Occupational History  . Occupation: disabled  Social Needs  . Financial resource strain: Not on file  . Food insecurity:    Worry: Not on file    Inability: Not on file  . Transportation needs:    Medical: Not on file    Non-medical: Not on file  Tobacco Use  . Smoking status: Former Smoker    Packs/day: 0.10    Years: 20.00    Pack years: 2.00    Types: Cigarettes  . Smokeless tobacco: Never Used  . Tobacco comment: quit 3 months ago  Substance and Sexual Activity  . Alcohol use: No  . Drug use: No  . Sexual activity: Not on file  Lifestyle  . Physical activity:    Days per week: Not on file    Minutes per session: Not on file  . Stress: Not on file  Relationships  . Social connections:    Talks on phone: Not on file    Gets together: Not on file  Attends religious service: Not on file    Active member of club or organization: Not on file    Attends meetings of clubs or organizations: Not on file    Relationship status: Not on file  Other Topics Concern  . Not on file  Social History Narrative  . Not on file     Allergies:   Allergies  Allergen Reactions  . Bee Venom Anaphylaxis and Swelling  . Erythromycin Hives and Shortness Of Breath  . Lyrica [Pregabalin] Nausea And Vomiting and Rash  . Sulfa Antibiotics Rash  . Tramadol Rash    Metabolic Disorder Labs: No results found for: HGBA1C, MPG No results found for: PROLACTIN No results found for: CHOL, TRIG, HDL, CHOLHDL, VLDL, LDLCALC   Current Medications: Current Outpatient Medications  Medication Sig Dispense Refill  . busPIRone (BUSPAR) 30 MG tablet TAKE 1/3 TABLET BY MOUTH THREE TIMES A DAY 90 tablet 0  . Cyanocobalamin (RA VITAMIN B-12 TR) 1000 MCG TBCR Take 1,000 mcg by mouth daily.     . cycloSPORINE (RESTASIS) 0.05 % ophthalmic emulsion Place 1 drop into both eyes 2 (two) times daily.    Marland Kitchen dexlansoprazole (DEXILANT) 60 MG capsule Take 60 mg by mouth every morning.     . diphenoxylate-atropine (LOMOTIL) 2.5-0.025 MG per tablet Take 2 tablets by mouth 4 (four) times daily as needed for diarrhea or loose stools. 20 tablet 0  . DULoxetine (CYMBALTA) 20 MG capsule Take 2 capsules (40 mg total) by mouth daily. 60 capsule 2  . EPIPEN 2-PAK 0.3 MG/0.3ML SOAJ injection Inject 0.3 mg into the muscle once. for allergic reaction  1  . famotidine (PEPCID) 40 MG tablet Take 40 mg by mouth at bedtime.  0  . fluticasone (FLONASE) 50 MCG/ACT nasal spray Place 1 spray into both nostrils daily.    Marland Kitchen gabapentin (NEURONTIN) 300 MG capsule Take 300 mg by mouth 3 (three) times daily.    Marland Kitchen HYDROcodone-acetaminophen (NORCO/VICODIN) 5-325 MG per tablet 1 to 2 tabs every 4 to 6 hours as needed for pain. 20 tablet 0  . lamoTRIgine (LAMICTAL) 100 MG tablet One a day 90 tablet 0  . Linaclotide (LINZESS) 145 MCG CAPS Take 145 mcg by mouth daily.    . meloxicam (MOBIC) 15 MG tablet Take 15 mg by mouth daily.    . methocarbamol (ROBAXIN-750) 750 MG tablet Take 1 tablet (750 mg total) by mouth 4 (four) times daily. 30 tablet 0  . oxyCODONE-acetaminophen (PERCOCET) 7.5-325 MG tablet     . oxyCODONE-acetaminophen (PERCOCET/ROXICET) 5-325 MG per tablet Take 2 tablets by mouth every 4 (four) hours as needed for severe pain. 15 tablet 0  . PROAIR HFA 108 (90 BASE) MCG/ACT inhaler Inhale 2 puffs into the lungs every 6 (six) hours as needed for wheezing or shortness of breath.   4  . QUEtiapine (SEROQUEL) 100 MG tablet TAKE 1 TABLET BY MOUTH EVERY DAY AT NIGHT 30 tablet 2  . simvastatin (ZOCOR) 20 MG tablet Take 20 mg by mouth every evening.    . sucralfate (CARAFATE) 1 g tablet Take 1 g by mouth 3 (three) times daily.    Marland Kitchen trimethoprim (TRIMPEX) 100 MG tablet Take 100 mg by mouth daily.     No current  facility-administered medications for this visit.       Psychiatric Specialty Exam: Review of Systems  Constitutional: Positive for malaise/fatigue.  Cardiovascular: Negative for palpitations.  Gastrointestinal: Negative for nausea.  Musculoskeletal: Positive for back pain.  Skin: Negative for  itching.  Neurological: Negative for tremors.  Psychiatric/Behavioral: Negative for substance abuse and suicidal ideas.    There were no vitals taken for this visit.There is no height or weight on file to calculate BMI.  General Appearance: Casual  Eye Contact:  Fair  Speech:  Normal Rate  Volume:  Decreased  Mood: fair  Affect:  congruent  Thought Process:  Goal Directed  Orientation:  Full (Time, Place, and Person)  Thought Content:  Rumination  Suicidal Thoughts:  No  Homicidal Thoughts:  No  Memory:  Immediate;   Fair Recent;   Fair  Judgement:  Fair  Insight:  Fair  Psychomotor Activity:  Decreased  Concentration:  Concentration: Fair and Attention Span: Fair  Recall:  [employer] of Knowledge:Fair  Language: Fair  Akathisia:  Negative  Handed:  Right  AIMS (if indicated):     Assets:  Desire for Improvement  ADL's:  Intact  Cognition: WNL  Sleep:  Variable to fair    Treatment Plan Summary: Medication management and Plan as follows   Depression part of Mood disorder, recurrent and relavant to pain :  Doing fair with cymbalta 40mg , lamictal, seroquel, buspar Cautioned other meds can cause weight gain, she wants to continue seroquel, lamictal. Will continue No rash  [id number]:[id number] continue cymbalta and buspar  Insomnia: not worse. Continue seroquel. No tremors Fu 90m.  Thresa Ross, MD 12/13/201910:03 AM

## 2018-03-16 ENCOUNTER — Other Ambulatory Visit (HOSPITAL_COMMUNITY): Payer: Self-pay | Admitting: Psychiatry

## 2018-03-31 ENCOUNTER — Other Ambulatory Visit (HOSPITAL_COMMUNITY): Payer: Self-pay | Admitting: Psychiatry

## 2018-05-07 ENCOUNTER — Other Ambulatory Visit (HOSPITAL_COMMUNITY): Payer: Self-pay | Admitting: Psychiatry

## 2018-05-20 ENCOUNTER — Other Ambulatory Visit (HOSPITAL_COMMUNITY): Payer: Self-pay

## 2018-05-20 MED ORDER — LAMOTRIGINE 100 MG PO TABS: ORAL_TABLET | ORAL | 0 refills | Status: DC

## 2018-05-24 ENCOUNTER — Encounter (HOSPITAL_COMMUNITY): Payer: Self-pay | Admitting: Psychiatry

## 2018-05-24 ENCOUNTER — Other Ambulatory Visit: Payer: Self-pay

## 2018-05-24 ENCOUNTER — Ambulatory Visit (INDEPENDENT_AMBULATORY_CARE_PROVIDER_SITE_OTHER): Payer: Medicare Other | Admitting: Psychiatry

## 2018-05-24 VITALS — BP 126/86 | HR 84 | Ht 61.0 in | Wt 193.0 lb

## 2018-05-24 DIAGNOSIS — F5102 Adjustment insomnia: Secondary | ICD-10-CM

## 2018-05-24 DIAGNOSIS — F331 Major depressive disorder, recurrent, moderate: Secondary | ICD-10-CM | POA: Diagnosis not present

## 2018-05-24 DIAGNOSIS — G894 Chronic pain syndrome: Secondary | ICD-10-CM | POA: Diagnosis not present

## 2018-05-24 DIAGNOSIS — F411 Generalized anxiety disorder: Secondary | ICD-10-CM | POA: Diagnosis not present

## 2018-05-24 MED ORDER — QUETIAPINE FUMARATE 100 MG PO TABS: ORAL_TABLET | ORAL | 2 refills | Status: DC

## 2018-05-24 MED ORDER — DULOXETINE HCL 20 MG PO CPEP: 40.0000 mg | ORAL_CAPSULE | Freq: Every day | ORAL | 0 refills | Status: DC

## 2018-05-24 NOTE — Progress Notes (Signed)
[facility] Outpatient Follow up visit   Patient Identification: Chelsey Castro MRN:  [id number] Date of Evaluation:  05/24/2018 Referral Source: Dr/ [name].  Chief Complaint:   Chief Complaint    Follow-up; Other     Visit Diagnosis:    ICD-10-CM   1. Moderate episode of recurrent major depressive disorder (HCC) F33.1   2. GAD (generalized anxiety disorder) F41.1   3. Chronic pain syndrome G89.4   4. Adjustment insomnia F51.02     History of Present Illness:  63 years old currently single Caucasian female living with her ex-husband initialy referred to primary care physician. She has seen Dr. Baldemar Friday for psychiatry in the past     Has multiple back surgeries in  Past. On oxycontin that helps, otherwise  Pain effect mood  Doing fair Cymbalta was cut down because of multiple medication he is taking and we talked about weight concerns.  She continues to reasonable No rash on the Lamictal there is no tremors  Has x husband who lives with her  Her depression : 6/10  Anxiety : fluctuates Her aggravating factors : relocation, mom death      Past Medical History:  Past Medical History:  Diagnosis Date  . Chronic back pain   . Depression   . Diabetes (HCC)   . GERD (gastroesophageal reflux disease)   . Hyperlipemia   . OSA (obstructive sleep apnea)     Past Surgical History:  Procedure Laterality Date  . ANKLE SURGERY    . appenedectomy    . BACK SURGERY    . BREAST LUMPECTOMY     bilateral  . GALLBLADDER SURGERY    . hysterectomy    . KNEE SURGERY      Family Psychiatric History: not know or denies  Family History:  Family History  Problem Relation Age of Onset  . Diabetes Father   . High Cholesterol Father   . Heart disease Father   . Atrial fibrillation Father   . Atrial fibrillation Mother   . Alzheimer's disease Mother   . Cancer Sister   . Heart failure Sister     Social History:   Social History   Socioeconomic History  . Marital status: Single   Spouse name: Not on file  . Number of children: Not on file  . Years of education: Not on file  . Highest education level: Not on file  Occupational History  . Occupation: disabled  Social Needs  . Financial resource strain: Not on file  . Food insecurity:    Worry: Not on file    Inability: Not on file  . Transportation needs:    Medical: Not on file    Non-medical: Not on file  Tobacco Use  . Smoking status: Former Smoker    Packs/day: 0.10    Years: 20.00    Pack years: 2.00    Types: Cigarettes  . Smokeless tobacco: Never Used  . Tobacco comment: quit 3 months ago  Substance and Sexual Activity  . Alcohol use: No  . Drug use: No  . Sexual activity: Not on file  Lifestyle  . Physical activity:    Days per week: Not on file    Minutes per session: Not on file  . Stress: Not on file  Relationships  . Social connections:    Talks on phone: Not on file    Gets together: Not on file    Attends religious service: Not on file    Active member of club or  organization: Not on file    Attends meetings of clubs or organizations: Not on file    Relationship status: Not on file  Other Topics Concern  . Not on file  Social History Narrative  . Not on file     Allergies:   Allergies  Allergen Reactions  . Bee Venom Anaphylaxis and Swelling  . Erythromycin Hives and Shortness Of Breath  . Lyrica [Pregabalin] Nausea And Vomiting and Rash  . Sulfa Antibiotics Rash  . Tramadol Rash    Metabolic Disorder Labs: No results found for: HGBA1C, MPG No results found for: PROLACTIN No results found for: CHOL, TRIG, HDL, CHOLHDL, VLDL, LDLCALC   Current Medications: Current Outpatient Medications  Medication Sig Dispense Refill  . busPIRone (BUSPAR) 30 MG tablet TAKE 1/3 TABLET BY MOUTH THREE TIMES A DAY 90 tablet 0  . Cyanocobalamin (RA VITAMIN B-12 TR) 1000 MCG TBCR Take 1,000 mcg by mouth daily.    . cycloSPORINE (RESTASIS) 0.05 % ophthalmic emulsion Place 1 drop into  both eyes 2 (two) times daily.    Marland Kitchen. dexlansoprazole (DEXILANT) 60 MG capsule Take 60 mg by mouth every morning.     . diphenoxylate-atropine (LOMOTIL) 2.5-0.025 MG per tablet Take 2 tablets by mouth 4 (four) times daily as needed for diarrhea or loose stools. 20 tablet 0  . DULoxetine (CYMBALTA) 20 MG capsule Take 2 capsules (40 mg total) by mouth daily. 180 capsule 0  . EPIPEN 2-PAK 0.3 MG/0.3ML SOAJ injection Inject 0.3 mg into the muscle once. for allergic reaction  1  . famotidine (PEPCID) 40 MG tablet Take 40 mg by mouth at bedtime.  0  . fluticasone (FLONASE) 50 MCG/ACT nasal spray Place 1 spray into both nostrils daily.    Marland Kitchen. gabapentin (NEURONTIN) 300 MG capsule Take 300 mg by mouth 3 (three) times daily.    Marland Kitchen. HYDROcodone-acetaminophen (NORCO/VICODIN) 5-325 MG per tablet 1 to 2 tabs every 4 to 6 hours as needed for pain. 20 tablet 0  . lamoTRIgine (LAMICTAL) 100 MG tablet One a day 90 tablet 0  . Linaclotide (LINZESS) 145 MCG CAPS Take 145 mcg by mouth daily.    . meloxicam (MOBIC) 15 MG tablet Take 15 mg by mouth daily.    . methocarbamol (ROBAXIN-750) 750 MG tablet Take 1 tablet (750 mg total) by mouth 4 (four) times daily. 30 tablet 0  . oxyCODONE-acetaminophen (PERCOCET) 7.5-325 MG tablet     . oxyCODONE-acetaminophen (PERCOCET/ROXICET) 5-325 MG per tablet Take 2 tablets by mouth every 4 (four) hours as needed for severe pain. 15 tablet 0  . PROAIR HFA 108 (90 BASE) MCG/ACT inhaler Inhale 2 puffs into the lungs every 6 (six) hours as needed for wheezing or shortness of breath.   4  . QUEtiapine (SEROQUEL) 100 MG tablet TAKE 1 TABLET BY MOUTH EVERY DAY AT NIGHT 30 tablet 2  . simvastatin (ZOCOR) 20 MG tablet Take 20 mg by mouth every evening.    . sucralfate (CARAFATE) 1 g tablet Take 1 g by mouth 3 (three) times daily.    Marland Kitchen. trimethoprim (TRIMPEX) 100 MG tablet Take 100 mg by mouth daily.     No current facility-administered medications for this visit.       Psychiatric Specialty  Exam: Review of Systems  Constitutional: Positive for malaise/fatigue.  Cardiovascular: Negative for chest pain.  Gastrointestinal: Negative for nausea.  Musculoskeletal: Positive for back pain.  Skin: Negative for itching.  Neurological: Negative for tremors.  Psychiatric/Behavioral: Negative for substance abuse and  suicidal ideas.    Blood pressure 126/86, pulse 84, height 5\' 1"  (1.549 m), weight 193 lb (87.5 kg).Body mass index is 36.47 kg/m.  General Appearance: Casual  Eye Contact:  Fair  Speech:  Normal Rate  Volume:  Decreased  Mood: fair  Affect:  congruent  Thought Process:  Goal Directed  Orientation:  Full (Time, Place, and Person)  Thought Content:  Rumination  Suicidal Thoughts:  No  Homicidal Thoughts:  No  Memory:  Immediate;   Fair Recent;   Fair  Judgement:  Fair  Insight:  Fair  Psychomotor Activity:  Decreased  Concentration:  Concentration: Fair and Attention Span: Fair  Recall:  [employer] of Knowledge:Fair  Language: Fair  Akathisia:  Negative  Handed:  Right  AIMS (if indicated):     Assets:  Desire for Improvement  ADL's:  Intact  Cognition: WNL  Sleep:  Variable to fair    Treatment Plan Summary: Medication management and Plan as follows   Depression part of Mood disorder, relavant to pain. Manageable r with cymbalta 40mg , lamictal, seroquel, buspar Cautioned other meds can cause weight gain, she wants to continue seroquel, lamictal. Will continue No rash  POE:UMPN continue cymbalta Insomnia: not worse. Continue seroquel. No tremors Fu 14m.  Thresa Ross, MD 3/13/20209:57 AM

## 2018-06-09 ENCOUNTER — Other Ambulatory Visit (HOSPITAL_COMMUNITY): Payer: Self-pay | Admitting: Psychiatry

## 2018-07-30 ENCOUNTER — Other Ambulatory Visit (HOSPITAL_COMMUNITY): Payer: Self-pay | Admitting: Psychiatry

## 2018-08-23 ENCOUNTER — Ambulatory Visit (INDEPENDENT_AMBULATORY_CARE_PROVIDER_SITE_OTHER): Payer: Medicare Other | Admitting: Psychiatry

## 2018-08-23 ENCOUNTER — Encounter (HOSPITAL_COMMUNITY): Payer: Self-pay | Admitting: Psychiatry

## 2018-08-23 DIAGNOSIS — G894 Chronic pain syndrome: Secondary | ICD-10-CM | POA: Diagnosis not present

## 2018-08-23 DIAGNOSIS — F331 Major depressive disorder, recurrent, moderate: Secondary | ICD-10-CM

## 2018-08-23 DIAGNOSIS — F411 Generalized anxiety disorder: Secondary | ICD-10-CM

## 2018-08-23 DIAGNOSIS — F5102 Adjustment insomnia: Secondary | ICD-10-CM | POA: Diagnosis not present

## 2018-08-23 MED ORDER — DULOXETINE HCL 20 MG PO CPEP: 40.0000 mg | ORAL_CAPSULE | Freq: Every day | ORAL | 0 refills | Status: DC

## 2018-08-23 MED ORDER — QUETIAPINE FUMARATE 100 MG PO TABS: ORAL_TABLET | ORAL | 2 refills | Status: DC

## 2018-08-23 MED ORDER — BUSPIRONE HCL 30 MG PO TABS: ORAL_TABLET | ORAL | 0 refills | Status: DC

## 2018-08-23 NOTE — Progress Notes (Signed)
[facility] Outpatient Follow up visit   Patient Identification: Chelsey Castro MRN:  [id number] Date of Evaluation:  08/23/2018 Referral Source: Dr/ Reche Dixonalbot.  Chief Complaint:   depression follow up Visit Diagnosis:    ICD-10-CM   1. Moderate episode of recurrent major depressive disorder (HCC)  F33.1   2. GAD (generalized anxiety disorder)  F41.1   3. Chronic pain syndrome  G89.4   4. Adjustment insomnia  F51.02   I connected with Chelsey Castro on 08/23/18 at 10:00 AM EDT by telephone and verified that I am speaking with the correct person using two identifiers.   I discussed the limitations, risks, security and privacy concerns of performing an evaluation and management service by telephone and the availability of in person appointments. I also discussed with the patient that there may be a patient responsible charge related to this service. The patient expressed understanding and agreed to proceed.   History of Present Illness:  63 years old currently single Caucasian female living with her ex-husband initialy referred to primary care physician. She has seen Dr. Baldemar FridayLuv for psychiatry in the past     Has multiple back surgeries in  Past. On oxycontin that helps, otherwise  Pain effect mood  Doing fair on cymbalta. X husband supportive and her daughter Seroquel helps sleep and mood. Denies tremors  Her depression : 6/10  Anxiety : fluctuates Her aggravating factors : relocation, mom death      Past Medical History:  Past Medical History:  Diagnosis Date  . Chronic back pain   . Depression   . Diabetes (HCC)   . GERD (gastroesophageal reflux disease)   . Hyperlipemia   . OSA (obstructive sleep apnea)     Past Surgical History:  Procedure Laterality Date  . ANKLE SURGERY    . appenedectomy    . BACK SURGERY    . BREAST LUMPECTOMY     bilateral  . GALLBLADDER SURGERY    . hysterectomy    . KNEE SURGERY      Family Psychiatric History: not know or denies  Family  History:  Family History  Problem Relation Age of Onset  . Diabetes Father   . High Cholesterol Father   . Heart disease Father   . Atrial fibrillation Father   . Atrial fibrillation Mother   . Alzheimer's disease Mother   . Cancer Sister   . Heart failure Sister     Social History:   Social History   Socioeconomic History  . Marital status: Single    Spouse name: Not on file  . Number of children: Not on file  . Years of education: Not on file  . Highest education level: Not on file  Occupational History  . Occupation: disabled  Social Needs  . Financial resource strain: Not on file  . Food insecurity    Worry: Not on file    Inability: Not on file  . Transportation needs    Medical: Not on file    Non-medical: Not on file  Tobacco Use  . Smoking status: Former Smoker    Packs/day: 0.10    Years: 20.00    Pack years: 2.00    Types: Cigarettes  . Smokeless tobacco: Never Used  . Tobacco comment: quit 3 months ago  Substance and Sexual Activity  . Alcohol use: No  . Drug use: No  . Sexual activity: Not on file  Lifestyle  . Physical activity    Days per week: Not on file  Minutes per session: Not on file  . Stress: Not on file  Relationships  . Social Musicianconnections    Talks on phone: Not on file    Gets together: Not on file    Attends religious service: Not on file    Active member of club or organization: Not on file    Attends meetings of clubs or organizations: Not on file    Relationship status: Not on file  Other Topics Concern  . Not on file  Social History Narrative  . Not on file     Allergies:   Allergies  Allergen Reactions  . Bee Venom Anaphylaxis and Swelling  . Erythromycin Hives and Shortness Of Breath  . Lyrica [Pregabalin] Nausea And Vomiting and Rash  . Sulfa Antibiotics Rash  . Tramadol Rash    Metabolic Disorder Labs: No results found for: HGBA1C, MPG No results found for: PROLACTIN No results found for: CHOL, TRIG, HDL,  CHOLHDL, VLDL, LDLCALC   Current Medications: Current Outpatient Medications  Medication Sig Dispense Refill  . busPIRone (BUSPAR) 30 MG tablet As directed 90 tablet 0  . Cyanocobalamin (RA VITAMIN B-12 TR) 1000 MCG TBCR Take 1,000 mcg by mouth daily.    . cycloSPORINE (RESTASIS) 0.05 % ophthalmic emulsion Place 1 drop into both eyes 2 (two) times daily.    Marland Kitchen. dexlansoprazole (DEXILANT) 60 MG capsule Take 60 mg by mouth every morning.     . diphenoxylate-atropine (LOMOTIL) 2.5-0.025 MG per tablet Take 2 tablets by mouth 4 (four) times daily as needed for diarrhea or loose stools. 20 tablet 0  . DULoxetine (CYMBALTA) 20 MG capsule Take 2 capsules (40 mg total) by mouth daily. 180 capsule 0  . EPIPEN 2-PAK 0.3 MG/0.3ML SOAJ injection Inject 0.3 mg into the muscle once. for allergic reaction  1  . famotidine (PEPCID) 40 MG tablet Take 40 mg by mouth at bedtime.  0  . fluticasone (FLONASE) 50 MCG/ACT nasal spray Place 1 spray into both nostrils daily.    Marland Kitchen. gabapentin (NEURONTIN) 300 MG capsule Take 300 mg by mouth 3 (three) times daily.    Marland Kitchen. HYDROcodone-acetaminophen (NORCO/VICODIN) 5-325 MG per tablet 1 to 2 tabs every 4 to 6 hours as needed for pain. 20 tablet 0  . lamoTRIgine (LAMICTAL) 100 MG tablet TAKE 1 TABLET BY MOUTH EVERY DAY 90 tablet 0  . Linaclotide (LINZESS) 145 MCG CAPS Take 145 mcg by mouth daily.    . meloxicam (MOBIC) 15 MG tablet Take 15 mg by mouth daily.    . methocarbamol (ROBAXIN-750) 750 MG tablet Take 1 tablet (750 mg total) by mouth 4 (four) times daily. 30 tablet 0  . oxyCODONE-acetaminophen (PERCOCET) 7.5-325 MG tablet     . oxyCODONE-acetaminophen (PERCOCET/ROXICET) 5-325 MG per tablet Take 2 tablets by mouth every 4 (four) hours as needed for severe pain. 15 tablet 0  . PROAIR HFA 108 (90 BASE) MCG/ACT inhaler Inhale 2 puffs into the lungs every 6 (six) hours as needed for wheezing or shortness of breath.   4  . QUEtiapine (SEROQUEL) 100 MG tablet TAKE 1 TABLET BY  MOUTH EVERY DAY AT NIGHT 30 tablet 2  . simvastatin (ZOCOR) 20 MG tablet Take 20 mg by mouth every evening.    . sucralfate (CARAFATE) 1 g tablet Take 1 g by mouth 3 (three) times daily.    Marland Kitchen. trimethoprim (TRIMPEX) 100 MG tablet Take 100 mg by mouth daily.     No current facility-administered medications for this visit.  Psychiatric Specialty Exam: Review of Systems  Constitutional: Positive for malaise/fatigue.  Cardiovascular: Negative for chest pain.  Gastrointestinal: Negative for nausea.  Musculoskeletal: Positive for back pain.  Skin: Negative for itching.  Neurological: Negative for tremors.  Psychiatric/Behavioral: Negative for depression and suicidal ideas.    There were no vitals taken for this visit.There is no height or weight on file to calculate BMI.  General Appearance:   Eye Contact:    Speech:  Normal Rate  Volume:  Decreased  Mood:fair  Affect:  congruent  Thought Process:  Goal Directed  Orientation:  Full (Time, Place, and Person)  Thought Content:  Rumination  Suicidal Thoughts:  No  Homicidal Thoughts:  No  Memory:  Immediate;   Fair Recent;   Fair  Judgement:  Fair  Insight:  Fair  Psychomotor Activity:  Decreased  Concentration:  Concentration: Fair and Attention Span: Fair  Recall:  [employer] of Knowledge:Fair  Language: Fair  Akathisia:  Negative  Handed:  Right  AIMS (if indicated):     Assets:  Desire for Improvement  ADL's:  Intact  Cognition: WNL  Sleep:  Variable to fair    Treatment Plan Summary: Medication management and Plan as follows   Depression part of Mood disorder, relavant to pain. Manageable r with cymbalta 40mg , lamictal, seroquel, buspar Doing fair. Continue and work on sleep hygiene instead of increasing seroquel   ACZ:YSAY continue cymbalta Insomnia: not worse. Continue seroquel. No tremors  I discussed the assessment and treatment plan with the patient. The patient was provided an opportunity to ask  questions and all were answered. The patient agreed with the plan and demonstrated an understanding of the instructions.   The patient was advised to call back or seek an in-person evaluation if the symptoms worsen or if the condition fails to improve as anticipated.  Merian Capron, MD 6/12/202010:08 AM

## 2018-08-26 ENCOUNTER — Telehealth (HOSPITAL_COMMUNITY): Payer: Self-pay

## 2018-08-26 MED ORDER — BUSPIRONE HCL 30 MG PO TABS: ORAL_TABLET | ORAL | 0 refills | Status: DC

## 2018-08-26 NOTE — Telephone Encounter (Signed)
Changed rx instruction per Dr. De Nurse and resent to pharmacy. Nothing further is needed at this time

## 2018-08-26 NOTE — Telephone Encounter (Signed)
[facility] pharmacy wants to know how Buspar is supposed to be taken, instructions are not in rx that was sent on 08/23/18

## 2018-08-26 NOTE — Telephone Encounter (Signed)
Take 1/3rd tid for buspar. 30 tabs for one month or 90 tabs for 50months

## 2018-10-30 ENCOUNTER — Other Ambulatory Visit (HOSPITAL_COMMUNITY): Payer: Self-pay

## 2018-11-05 ENCOUNTER — Other Ambulatory Visit (HOSPITAL_COMMUNITY): Payer: Self-pay

## 2018-11-05 MED ORDER — LAMOTRIGINE 100 MG PO TABS: ORAL_TABLET | ORAL | 0 refills | Status: DC

## 2018-11-20 ENCOUNTER — Other Ambulatory Visit (HOSPITAL_COMMUNITY): Payer: Self-pay | Admitting: Psychiatry

## 2018-11-21 ENCOUNTER — Encounter (HOSPITAL_COMMUNITY): Payer: Self-pay | Admitting: Psychiatry

## 2018-11-21 ENCOUNTER — Ambulatory Visit (INDEPENDENT_AMBULATORY_CARE_PROVIDER_SITE_OTHER): Payer: Medicare Other | Admitting: Psychiatry

## 2018-11-21 ENCOUNTER — Ambulatory Visit (HOSPITAL_COMMUNITY): Payer: Medicare Other | Admitting: Psychiatry

## 2018-11-21 DIAGNOSIS — F331 Major depressive disorder, recurrent, moderate: Secondary | ICD-10-CM

## 2018-11-21 DIAGNOSIS — G894 Chronic pain syndrome: Secondary | ICD-10-CM

## 2018-11-21 DIAGNOSIS — F5102 Adjustment insomnia: Secondary | ICD-10-CM

## 2018-11-21 DIAGNOSIS — F411 Generalized anxiety disorder: Secondary | ICD-10-CM

## 2018-11-21 MED ORDER — DULOXETINE HCL 20 MG PO CPEP: 40.0000 mg | ORAL_CAPSULE | Freq: Every day | ORAL | 0 refills | Status: DC

## 2018-11-21 MED ORDER — QUETIAPINE FUMARATE 50 MG PO TABS: ORAL_TABLET | ORAL | 2 refills | Status: DC

## 2018-11-21 NOTE — Progress Notes (Signed)
[facility] Outpatient Follow up visit   Patient Identification: Chelsey Castro MRN:  [id number] Date of Evaluation:  11/21/2018 Referral Source: Dr/ Reche Dixonalbot.  Chief Complaint:   depression follow up Visit Diagnosis:    ICD-10-CM   1. Moderate episode of recurrent major depressive disorder (HCC)  F33.1   2. GAD (generalized anxiety disorder)  F41.1   3. Chronic pain syndrome  G89.4   4. Adjustment insomnia  F51.02    I connected with Chelsey Castro on 11/21/18 at  8:45 AM EDT by telephone and verified that I am speaking with the correct person using two identifiers.      I discussed the limitations, risks, security and privacy concerns of performing an evaluation and management service by telephone and the availability of in person appointments. I also discussed with the patient that there may be a patient responsible charge related to this service. The patient expressed understanding and agreed to proceed.   History of Present Illness:  63 years old currently single Caucasian female living with her ex-husband initialy referred to primary care physician. She has seen Dr. Baldemar FridayLuv for psychiatry in the past     Has multiple back surgeries in  Past. On oxycontin that helps, otherwise  Pain effect mood  Doing fair on meds. No rash Some shakes at times .  Her depression : 6/10  Anxiety : fluctuates Her aggravating factors : relocation, mom death      Past Medical History:  Past Medical History:  Diagnosis Date  . Chronic back pain   . Depression   . Diabetes (HCC)   . GERD (gastroesophageal reflux disease)   . Hyperlipemia   . OSA (obstructive sleep apnea)     Past Surgical History:  Procedure Laterality Date  . ANKLE SURGERY    . appenedectomy    . BACK SURGERY    . BREAST LUMPECTOMY     bilateral  . GALLBLADDER SURGERY    . hysterectomy    . KNEE SURGERY      Family Psychiatric History: not know or denies  Family History:  Family History  Problem Relation Age of  Onset  . Diabetes Father   . High Cholesterol Father   . Heart disease Father   . Atrial fibrillation Father   . Atrial fibrillation Mother   . Alzheimer's disease Mother   . Cancer Sister   . Heart failure Sister     Social History:   Social History   Socioeconomic History  . Marital status: Single    Spouse name: Not on file  . Number of children: Not on file  . Years of education: Not on file  . Highest education level: Not on file  Occupational History  . Occupation: disabled  Social Needs  . Financial resource strain: Not on file  . Food insecurity    Worry: Not on file    Inability: Not on file  . Transportation needs    Medical: Not on file    Non-medical: Not on file  Tobacco Use  . Smoking status: Former Smoker    Packs/day: 0.10    Years: 20.00    Pack years: 2.00    Types: Cigarettes  . Smokeless tobacco: Never Used  . Tobacco comment: quit 3 months ago  Substance and Sexual Activity  . Alcohol use: No  . Drug use: No  . Sexual activity: Not on file  Lifestyle  . Physical activity    Days per week: Not on file  Minutes per session: Not on file  . Stress: Not on file  Relationships  . Social Herbalist on phone: Not on file    Gets together: Not on file    Attends religious service: Not on file    Active member of club or organization: Not on file    Attends meetings of clubs or organizations: Not on file    Relationship status: Not on file  Other Topics Concern  . Not on file  Social History Narrative  . Not on file     Allergies:   Allergies  Allergen Reactions  . Bee Venom Anaphylaxis and Swelling  . Erythromycin Hives and Shortness Of Breath  . Lyrica [Pregabalin] Nausea And Vomiting and Rash  . Sulfa Antibiotics Rash  . Tramadol Rash    Metabolic Disorder Labs: No results found for: HGBA1C, MPG No results found for: PROLACTIN No results found for: CHOL, TRIG, HDL, CHOLHDL, VLDL, LDLCALC   Current  Medications: Current Outpatient Medications  Medication Sig Dispense Refill  . busPIRone (BUSPAR) 30 MG tablet Take 1/3 tab three times daily. 90 tablet 0  . Cyanocobalamin (RA VITAMIN B-12 TR) 1000 MCG TBCR Take 1,000 mcg by mouth daily.    . cycloSPORINE (RESTASIS) 0.05 % ophthalmic emulsion Place 1 drop into both eyes 2 (two) times daily.    Marland Kitchen dexlansoprazole (DEXILANT) 60 MG capsule Take 60 mg by mouth every morning.     . diphenoxylate-atropine (LOMOTIL) 2.5-0.025 MG per tablet Take 2 tablets by mouth 4 (four) times daily as needed for diarrhea or loose stools. 20 tablet 0  . DULoxetine (CYMBALTA) 20 MG capsule Take 2 capsules (40 mg total) by mouth daily. 180 capsule 0  . EPIPEN 2-PAK 0.3 MG/0.3ML SOAJ injection Inject 0.3 mg into the muscle once. for allergic reaction  1  . famotidine (PEPCID) 40 MG tablet Take 40 mg by mouth at bedtime.  0  . fluticasone (FLONASE) 50 MCG/ACT nasal spray Place 1 spray into both nostrils daily.    Marland Kitchen gabapentin (NEURONTIN) 300 MG capsule Take 300 mg by mouth 3 (three) times daily.    Marland Kitchen HYDROcodone-acetaminophen (NORCO/VICODIN) 5-325 MG per tablet 1 to 2 tabs every 4 to 6 hours as needed for pain. 20 tablet 0  . lamoTRIgine (LAMICTAL) 100 MG tablet TAKE 1 TABLET BY MOUTH EVERY DAY 90 tablet 0  . Linaclotide (LINZESS) 145 MCG CAPS Take 145 mcg by mouth daily.    . meloxicam (MOBIC) 15 MG tablet Take 15 mg by mouth daily.    . methocarbamol (ROBAXIN-750) 750 MG tablet Take 1 tablet (750 mg total) by mouth 4 (four) times daily. 30 tablet 0  . oxyCODONE-acetaminophen (PERCOCET) 7.5-325 MG tablet     . oxyCODONE-acetaminophen (PERCOCET/ROXICET) 5-325 MG per tablet Take 2 tablets by mouth every 4 (four) hours as needed for severe pain. 15 tablet 0  . PROAIR HFA 108 (90 BASE) MCG/ACT inhaler Inhale 2 puffs into the lungs every 6 (six) hours as needed for wheezing or shortness of breath.   4  . QUEtiapine (SEROQUEL) 50 MG tablet TAKE 1 TABLET BY MOUTH EVERY DAY AT  NIGHT 30 tablet 2  . simvastatin (ZOCOR) 20 MG tablet Take 20 mg by mouth every evening.    . sucralfate (CARAFATE) 1 g tablet Take 1 g by mouth 3 (three) times daily.    Marland Kitchen trimethoprim (TRIMPEX) 100 MG tablet Take 100 mg by mouth daily.     No current facility-administered medications for  this visit.       Psychiatric Specialty Exam: Review of Systems  Constitutional: Positive for malaise/fatigue.  Cardiovascular: Negative for chest pain.  Gastrointestinal: Negative for nausea.  Musculoskeletal: Positive for back pain.  Skin: Negative for itching.  Psychiatric/Behavioral: Negative for depression and suicidal ideas.    There were no vitals taken for this visit.There is no height or weight on file to calculate BMI.  General Appearance:   Eye Contact:    Speech:  Normal Rate  Volume:  Decreased  Mood:fair  Affect:  congruent  Thought Process:  Goal Directed  Orientation:  Full (Time, Place, and Person)  Thought Content:  Rumination  Suicidal Thoughts:  No  Homicidal Thoughts:  No  Memory:  Immediate;   Fair Recent;   Fair  Judgement:  Fair  Insight:  Fair  Psychomotor Activity:  Decreased  Concentration:  Concentration: Fair and Attention Span: Fair  Recall:  [employer] of Knowledge:Fair  Language: Fair  Akathisia:  Negative  Handed:  Right  AIMS (if indicated):     Assets:  Desire for Improvement  ADL's:  Intact  Cognition: WNL  Sleep:  Variable to fair    Treatment Plan Summary: Medication management and Plan as follows   Depression part of Mood disorder, relavant to pain.fair. continue lamictal, seroquel Will cut down to 50mg  re assess for shakes, check with pcp to rule out any other causes No other involuntary movements PYK:DXIP continue cymbalta Insomnia: not worse. Continue seroquel. No tremors  I discussed the assessment and treatment plan with the patient. The patient was provided an opportunity to ask questions and all were answered. The patient  agreed with the plan and demonstrated an understanding of the instructions.   The patient was advised to call back or seek an in-person evaluation if the symptoms worsen or if the condition fails to improve as anticipated. Fu 2m. Sent refills which were due Thresa Ross, MD 9/10/20208:51 AM

## 2018-12-11 ENCOUNTER — Other Ambulatory Visit (HOSPITAL_COMMUNITY): Payer: Self-pay | Admitting: Psychiatry

## 2018-12-14 ENCOUNTER — Other Ambulatory Visit (HOSPITAL_COMMUNITY): Payer: Self-pay | Admitting: Psychiatry

## 2019-01-12 ENCOUNTER — Other Ambulatory Visit (HOSPITAL_COMMUNITY): Payer: Self-pay | Admitting: Psychiatry

## 2019-01-20 ENCOUNTER — Other Ambulatory Visit (HOSPITAL_COMMUNITY): Payer: Self-pay | Admitting: Psychiatry

## 2019-01-20 NOTE — Telephone Encounter (Signed)
I ordered 2 weeks ago. Call when due again

## 2019-01-29 ENCOUNTER — Other Ambulatory Visit (HOSPITAL_COMMUNITY): Payer: Self-pay | Admitting: Psychiatry

## 2019-02-20 ENCOUNTER — Encounter (HOSPITAL_COMMUNITY): Payer: Self-pay | Admitting: Psychiatry

## 2019-02-20 ENCOUNTER — Ambulatory Visit (INDEPENDENT_AMBULATORY_CARE_PROVIDER_SITE_OTHER): Payer: Medicare Other | Admitting: Psychiatry

## 2019-02-20 DIAGNOSIS — F411 Generalized anxiety disorder: Secondary | ICD-10-CM

## 2019-02-20 DIAGNOSIS — G894 Chronic pain syndrome: Secondary | ICD-10-CM | POA: Diagnosis not present

## 2019-02-20 DIAGNOSIS — F331 Major depressive disorder, recurrent, moderate: Secondary | ICD-10-CM | POA: Diagnosis not present

## 2019-02-20 MED ORDER — DULOXETINE HCL 20 MG PO CPEP: 40.0000 mg | ORAL_CAPSULE | Freq: Every day | ORAL | 0 refills | Status: DC

## 2019-02-20 NOTE — Progress Notes (Signed)
[facility] Outpatient Follow up visit   Patient Identification: Chelsey Castro MRN:  [id number] Date of Evaluation:  02/20/2019 Referral Source: Dr/ Chelsey Castro.  Chief Complaint:   depression follow up Visit Diagnosis:  No diagnosis found.      I connected with Chelsey Castro on 02/20/19 at 10:15 AM EST by telephone and verified that I am speaking with the correct person using two identifiers.   I discussed the limitations, risks, security and privacy concerns of performing an evaluation and management service by telephone and the availability of in person appointments. I also discussed with the patient that there may be a patient responsible charge related to this service. The patient expressed understanding and agreed to proceed.   History of Present Illness:  63 years old currently single Caucasian female living with her ex-husband initialy referred to primary care physician. She has seen Dr. Baldemar Castro for psychiatry in the past     Has multiple back surgeries in  Past. On oxycontin that helps, otherwise  Pain effect mood Doing fair, we cut down seroquel last visit still has tremors or shakes, says has it before seroquel and not sure if its related   Anxiety : fluctuates Her aggravating factors : relocation, mom death   Past Medical History:  Past Medical History:  Diagnosis Date  . Chronic back pain   . Depression   . Diabetes (HCC)   . GERD (gastroesophageal reflux disease)   . Hyperlipemia   . OSA (obstructive sleep apnea)     Past Surgical History:  Procedure Laterality Date  . ANKLE SURGERY    . appenedectomy    . BACK SURGERY    . BREAST LUMPECTOMY     bilateral  . GALLBLADDER SURGERY    . hysterectomy    . KNEE SURGERY      Family Psychiatric History: not know or denies  Family History:  Family History  Problem Relation Age of Onset  . Diabetes Father   . High Cholesterol Father   . Heart disease Father   . Atrial fibrillation Father   . Atrial fibrillation  Mother   . Alzheimer's disease Mother   . Cancer Sister   . Heart failure Sister     Social History:   Social History   Socioeconomic History  . Marital status: Single    Spouse name: Not on file  . Number of children: Not on file  . Years of education: Not on file  . Highest education level: Not on file  Occupational History  . Occupation: disabled  Tobacco Use  . Smoking status: Former Smoker    Packs/day: 0.10    Years: 20.00    Pack years: 2.00    Types: Cigarettes  . Smokeless tobacco: Never Used  . Tobacco comment: quit 3 months ago  Substance and Sexual Activity  . Alcohol use: No  . Drug use: No  . Sexual activity: Not on file  Other Topics Concern  . Not on file  Social History Narrative  . Not on file   Social Determinants of Health   Financial Resource Strain:   . Difficulty of Paying Living Expenses: Not on file  Food Insecurity:   . Worried About Programme researcher, broadcasting/film/video in the Last Year: Not on file  . Ran Out of Food in the Last Year: Not on file  Transportation Needs:   . Lack of Transportation (Medical): Not on file  . Lack of Transportation (Non-Medical): Not on file  Physical Activity:   .  Days of Exercise per Week: Not on file  . Minutes of Exercise per Session: Not on file  Stress:   . Feeling of Stress : Not on file  Social Connections:   . Frequency of Communication with Friends and Family: Not on file  . Frequency of Social Gatherings with Friends and Family: Not on file  . Attends Religious Services: Not on file  . Active Member of Clubs or Organizations: Not on file  . Attends Archivist Meetings: Not on file  . Marital Status: Not on file     Allergies:   Allergies  Allergen Reactions  . Bee Venom Anaphylaxis and Swelling  . Erythromycin Hives and Shortness Of Breath  . Lyrica [Pregabalin] Nausea And Vomiting and Rash  . Sulfa Antibiotics Rash  . Tramadol Rash    Metabolic Disorder Labs: No results found for:  HGBA1C, MPG No results found for: PROLACTIN No results found for: CHOL, TRIG, HDL, CHOLHDL, VLDL, LDLCALC   Current Medications: Current Outpatient Medications  Medication Sig Dispense Refill  . busPIRone (BUSPAR) 30 MG tablet TAKE 1/3 TAB THREE TIMES DAILY. 90 tablet 0  . Cyanocobalamin (RA VITAMIN B-12 TR) 1000 MCG TBCR Take 1,000 mcg by mouth daily.    . cycloSPORINE (RESTASIS) 0.05 % ophthalmic emulsion Place 1 drop into both eyes 2 (two) times daily.    Marland Kitchen dexlansoprazole (DEXILANT) 60 MG capsule Take 60 mg by mouth every morning.     . diphenoxylate-atropine (LOMOTIL) 2.5-0.025 MG per tablet Take 2 tablets by mouth 4 (four) times daily as needed for diarrhea or loose stools. 20 tablet 0  . DULoxetine (CYMBALTA) 20 MG capsule Take 2 capsules (40 mg total) by mouth daily. 180 capsule 0  . EPIPEN 2-PAK 0.3 MG/0.3ML SOAJ injection Inject 0.3 mg into the muscle once. for allergic reaction  1  . famotidine (PEPCID) 40 MG tablet Take 40 mg by mouth at bedtime.  0  . fluticasone (FLONASE) 50 MCG/ACT nasal spray Place 1 spray into both nostrils daily.    Marland Kitchen gabapentin (NEURONTIN) 300 MG capsule Take 300 mg by mouth 3 (three) times daily.    Marland Kitchen HYDROcodone-acetaminophen (NORCO/VICODIN) 5-325 MG per tablet 1 to 2 tabs every 4 to 6 hours as needed for pain. 20 tablet 0  . lamoTRIgine (LAMICTAL) 100 MG tablet TAKE 1 TABLET BY MOUTH EVERY DAY 90 tablet 0  . Linaclotide (LINZESS) 145 MCG CAPS Take 145 mcg by mouth daily.    . meloxicam (MOBIC) 15 MG tablet Take 15 mg by mouth daily.    . methocarbamol (ROBAXIN-750) 750 MG tablet Take 1 tablet (750 mg total) by mouth 4 (four) times daily. 30 tablet 0  . oxyCODONE-acetaminophen (PERCOCET) 7.5-325 MG tablet     . oxyCODONE-acetaminophen (PERCOCET/ROXICET) 5-325 MG per tablet Take 2 tablets by mouth every 4 (four) hours as needed for severe pain. 15 tablet 0  . PROAIR HFA 108 (90 BASE) MCG/ACT inhaler Inhale 2 puffs into the lungs every 6 (six) hours as  needed for wheezing or shortness of breath.   4  . simvastatin (ZOCOR) 20 MG tablet Take 20 mg by mouth every evening.    . sucralfate (CARAFATE) 1 g tablet Take 1 g by mouth 3 (three) times daily.    Marland Kitchen trimethoprim (TRIMPEX) 100 MG tablet Take 100 mg by mouth daily.     No current facility-administered medications for this visit.      Psychiatric Specialty Exam: Review of Systems  Cardiovascular: Negative for chest  pain.  Musculoskeletal: Positive for back pain.  Skin: Negative for rash.  Psychiatric/Behavioral: Negative for depression and suicidal ideas.    There were no vitals taken for this visit.There is no height or weight on file to calculate BMI.  General Appearance:   Eye Contact:    Speech:  Normal Rate  Volume:  Decreased  Mood: fair  Affect:  congruent  Thought Process:  Goal Directed  Orientation:  Full (Time, Place, and Person)  Thought Content:  Rumination  Suicidal Thoughts:  No  Homicidal Thoughts:  No  Memory:  Immediate;   Fair Recent;   Fair  Judgement:  Fair  Insight:  Fair  Psychomotor Activity:  Decreased  Concentration:  Concentration: Fair and Attention Span: Fair  Recall:  FiservFair  Fund of Knowledge:Fair  Language: Fair  Akathisia:  Negative  Handed:  Right  AIMS (if indicated):     Assets:  Desire for Improvement  ADL's:  Intact  Cognition: WNL  Sleep:  Variable to fair    Treatment Plan Summary: Medication management and Plan as follows   Depression part of Mood disorder, relavant to pain.fair.fair on lamictal. Will stop the seroquel now only at 50mg . Re eval shakes or fu with pcp for evaluation,  No other twitches WUJ:WJXBGAD:fair continue cymbalta Insomnia: not worse. Continue seroquel. No tremors  I discussed the assessment and treatment plan with the patient. The patient was provided an opportunity to ask questions and all were answered. The patient agreed with the plan and demonstrated an understanding of the instructions.   The patient  was advised to call back or seek an in-person evaluation if the symptoms worsen or if the condition fails to improve as anticipated. Fu 6975m. Sent refills which were due Thresa RossNadeem Ladarious Kresse, MD 12/10/202010:21 AM

## 2019-03-01 ENCOUNTER — Other Ambulatory Visit (HOSPITAL_COMMUNITY): Payer: Self-pay | Admitting: Psychiatry

## 2019-03-03 ENCOUNTER — Telehealth (HOSPITAL_COMMUNITY): Payer: Self-pay | Admitting: Psychiatry

## 2019-03-03 ENCOUNTER — Other Ambulatory Visit (HOSPITAL_COMMUNITY): Payer: Self-pay | Admitting: Psychiatry

## 2019-03-03 MED ORDER — TRAZODONE HCL 100 MG PO TABS: 100.0000 mg | ORAL_TABLET | Freq: Every evening | ORAL | 0 refills | Status: DC | PRN

## 2019-03-03 NOTE — Telephone Encounter (Signed)
Informed patient

## 2019-03-03 NOTE — Telephone Encounter (Signed)
I ordered her trazodone 100 mg prn insomnia - did not see it mentioned in notes so maybe she has not tried it. If her sleep is related to chronic pain no sedative will likely be very effective.

## 2019-03-03 NOTE — Telephone Encounter (Signed)
Pt calling because De Nurse took her off of seroquel and she needs something to help her sleep.   [facility]  Please advise. CB # [phone]

## 2019-03-27 ENCOUNTER — Other Ambulatory Visit (HOSPITAL_COMMUNITY): Payer: Self-pay | Admitting: Psychiatry

## 2019-04-17 ENCOUNTER — Other Ambulatory Visit (HOSPITAL_COMMUNITY): Payer: Self-pay

## 2019-04-17 MED ORDER — LAMOTRIGINE 100 MG PO TABS: 100.0000 mg | ORAL_TABLET | Freq: Every day | ORAL | 0 refills | Status: DC

## 2019-05-22 ENCOUNTER — Ambulatory Visit (HOSPITAL_COMMUNITY): Payer: Medicare Other | Admitting: Psychiatry

## 2019-05-26 ENCOUNTER — Encounter (HOSPITAL_COMMUNITY): Payer: Self-pay | Admitting: Psychiatry

## 2019-05-26 ENCOUNTER — Ambulatory Visit (INDEPENDENT_AMBULATORY_CARE_PROVIDER_SITE_OTHER): Payer: Medicare Other | Admitting: Psychiatry

## 2019-05-26 DIAGNOSIS — F331 Major depressive disorder, recurrent, moderate: Secondary | ICD-10-CM

## 2019-05-26 DIAGNOSIS — F411 Generalized anxiety disorder: Secondary | ICD-10-CM | POA: Diagnosis not present

## 2019-05-26 DIAGNOSIS — G4700 Insomnia, unspecified: Secondary | ICD-10-CM

## 2019-05-26 DIAGNOSIS — G894 Chronic pain syndrome: Secondary | ICD-10-CM

## 2019-05-26 MED ORDER — BUSPIRONE HCL 30 MG PO TABS: ORAL_TABLET | ORAL | 0 refills | Status: DC

## 2019-05-26 MED ORDER — BUPROPION HCL ER (SR) 100 MG PO TB12: 100.0000 mg | ORAL_TABLET | Freq: Two times a day (BID) | ORAL | 1 refills | Status: DC

## 2019-05-26 NOTE — Progress Notes (Signed)
[facility] Outpatient Follow up visit   Patient Identification: Chelsey Castro MRN:  [id number] Date of Evaluation:  05/26/2019 Referral Source: Dr/ [name].  Chief Complaint:    depression follow up  Visit Diagnosis:    ICD-10-CM   1. Moderate episode of recurrent major depressive disorder (HCC)  F33.1   2. GAD (generalized anxiety disorder)  F41.1   3. Chronic pain syndrome  G89.4        I connected with Chelsey Castro on 05/26/19 at  2:30 PM EDT by telephone and verified that I am speaking with the correct person using two identifiers.   I discussed the limitations, risks, security and privacy concerns of performing an evaluation and management service by telephone and the availability of in person appointments. I also discussed with the patient that there may be a patient responsible charge related to this service. The patient expressed understanding and agreed to proceed.   History of Present Illness:  64 years old currently single Caucasian female living with her ex-husband initialy referred to primary care physician. She has seen Dr. Baldemar Friday for psychiatry in the past     Has multiple back surgeries in  Past. On oxycontin that helps, otherwise  Pain effects mood, also on gaba  seroquel was stopped also didn't like trazadone so not taking it Discussed mobility during the day to help with sleep at night Feels subdued during the day cymbalta was also self stopped     Anxiety :fluctuates Her aggravating factors relocation, mom death, pain    Past Medical History:  Past Medical History:  Diagnosis Date  . Chronic back pain   . Depression   . Diabetes (HCC)   . GERD (gastroesophageal reflux disease)   . Hyperlipemia   . OSA (obstructive sleep apnea)     Past Surgical History:  Procedure Laterality Date  . ANKLE SURGERY    . appenedectomy    . BACK SURGERY    . BREAST LUMPECTOMY     bilateral  . GALLBLADDER SURGERY    . hysterectomy    . KNEE SURGERY      Family  Psychiatric History: not know or denies  Family History:  Family History  Problem Relation Age of Onset  . Diabetes Father   . High Cholesterol Father   . Heart disease Father   . Atrial fibrillation Father   . Atrial fibrillation Mother   . Alzheimer's disease Mother   . Cancer Sister   . Heart failure Sister     Social History:   Social History   Socioeconomic History  . Marital status: Single    Spouse name: Not on file  . Number of children: Not on file  . Years of education: Not on file  . Highest education level: Not on file  Occupational History  . Occupation: disabled  Tobacco Use  . Smoking status: Former Smoker    Packs/day: 0.10    Years: 20.00    Pack years: 2.00    Types: Cigarettes  . Smokeless tobacco: Never Used  . Tobacco comment: quit 3 months ago  Substance and Sexual Activity  . Alcohol use: No  . Drug use: No  . Sexual activity: Not on file  Other Topics Concern  . Not on file  Social History Narrative  . Not on file   Social Determinants of Health   Financial Resource Strain:   . Difficulty of Paying Living Expenses:   Food Insecurity:   . Worried About Programme researcher, broadcasting/film/video  in the Last Year:   . [place] in the Last Year:   Transportation Needs:   . Film/video editor (Medical):   Marland Kitchen Lack of Transportation (Non-Medical):   Physical Activity:   . Days of Exercise per Week:   . Minutes of Exercise per Session:   Stress:   . Feeling of Stress :   Social Connections:   . Frequency of Communication with Friends and Family:   . Frequency of Social Gatherings with Friends and Family:   . Attends Religious Services:   . Active Member of Clubs or Organizations:   . Attends Archivist Meetings:   Marland Kitchen Marital Status:      Allergies:   Allergies  Allergen Reactions  . Bee Venom Anaphylaxis and Swelling  . Erythromycin Hives and Shortness Of Breath  . Lyrica [Pregabalin] Nausea And Vomiting and Rash  . Sulfa  Antibiotics Rash  . Tramadol Rash    Metabolic Disorder Labs: No results found for: HGBA1C, MPG No results found for: PROLACTIN No results found for: CHOL, TRIG, HDL, CHOLHDL, VLDL, LDLCALC   Current Medications: Current Outpatient Medications  Medication Sig Dispense Refill  . buPROPion (WELLBUTRIN SR) 100 MG 12 hr tablet Take 1 tablet (100 mg total) by mouth 2 (two) times daily. 30 tablet 1  . busPIRone (BUSPAR) 30 MG tablet 1/3 tab three times a day. 90 tablet 0  . Cyanocobalamin (RA VITAMIN B-12 TR) 1000 MCG TBCR Take 1,000 mcg by mouth daily.    . cycloSPORINE (RESTASIS) 0.05 % ophthalmic emulsion Place 1 drop into both eyes 2 (two) times daily.    Marland Kitchen dexlansoprazole (DEXILANT) 60 MG capsule Take 60 mg by mouth every morning.     . diphenoxylate-atropine (LOMOTIL) 2.5-0.025 MG per tablet Take 2 tablets by mouth 4 (four) times daily as needed for diarrhea or loose stools. 20 tablet 0  . EPIPEN 2-PAK 0.3 MG/0.3ML SOAJ injection Inject 0.3 mg into the muscle once. for allergic reaction  1  . famotidine (PEPCID) 40 MG tablet Take 40 mg by mouth at bedtime.  0  . fluticasone (FLONASE) 50 MCG/ACT nasal spray Place 1 spray into both nostrils daily.    Marland Kitchen gabapentin (NEURONTIN) 300 MG capsule Take 300 mg by mouth 3 (three) times daily.    Marland Kitchen HYDROcodone-acetaminophen (NORCO/VICODIN) 5-325 MG per tablet 1 to 2 tabs every 4 to 6 hours as needed for pain. 20 tablet 0  . lamoTRIgine (LAMICTAL) 100 MG tablet Take 1 tablet (100 mg total) by mouth daily. 90 tablet 0  . Linaclotide (LINZESS) 145 MCG CAPS Take 145 mcg by mouth daily.    . meloxicam (MOBIC) 15 MG tablet Take 15 mg by mouth daily.    . methocarbamol (ROBAXIN-750) 750 MG tablet Take 1 tablet (750 mg total) by mouth 4 (four) times daily. 30 tablet 0  . oxyCODONE-acetaminophen (PERCOCET) 7.5-325 MG tablet     . oxyCODONE-acetaminophen (PERCOCET/ROXICET) 5-325 MG per tablet Take 2 tablets by mouth every 4 (four) hours as needed for severe  pain. 15 tablet 0  . PROAIR HFA 108 (90 BASE) MCG/ACT inhaler Inhale 2 puffs into the lungs every 6 (six) hours as needed for wheezing or shortness of breath.   4  . simvastatin (ZOCOR) 20 MG tablet Take 20 mg by mouth every evening.    . sucralfate (CARAFATE) 1 g tablet Take 1 g by mouth 3 (three) times daily.    Marland Kitchen trimethoprim (TRIMPEX) 100 MG tablet Take 100 mg  by mouth daily.     No current facility-administered medications for this visit.      Psychiatric Specialty Exam: Review of Systems  Cardiovascular: Negative for chest pain.  Musculoskeletal: Positive for back pain.  Psychiatric/Behavioral: Negative for depression and suicidal ideas.    There were no vitals taken for this visit.There is no height or weight on file to calculate BMI.  General Appearance:   Eye Contact:    Speech:  Normal Rate  Volume:  Decreased  Mood:somewhat subdued  Affect:  congruent  Thought Process:  Goal Directed  Orientation:  Full (Time, Place, and Person)  Thought Content:  Rumination  Suicidal Thoughts:  No  Homicidal Thoughts:  No  Memory:  Immediate;   Fair Recent;   Fair  Judgement:  Fair  Insight:  Fair  Psychomotor Activity:  Decreased  Concentration:  Concentration: Fair and Attention Span: Fair  Recall:  [employer] of Knowledge:Fair  Language: Fair  Akathisia:  Negative  Handed:  Right  AIMS (if indicated):     Assets:  Desire for Improvement  ADL's:  Intact  Cognition: WNL  Sleep:  Variable to fair    Treatment Plan Summary: Medication management and Plan as follows   Depression part of Mood disorder, relavant to pain. Somewhat subdued, continue lamictal. Also on gaba Will add small dose wellbutrin during the day  GAD:: fluctuates, not taking cymbalta, continue gaba also helps with anxiety. Discussed distractions Insomnia: irregular, reviewed sleep hygiene avoid naps during the day. Continue gaba  I discussed the assessment and treatment plan with the patient. The  patient was provided an opportunity to ask questions and all were answered. The patient agreed with the plan and demonstrated an understanding of the instructions.   The patient was advised to call back or seek an in-person evaluation if the symptoms worsen or if the condition fails to improve as anticipated. Fu 65m. Sent refills which were due Non face to face time spent: Thresa Ross, MD 3/15/20212:43 PM

## 2019-05-31 ENCOUNTER — Other Ambulatory Visit (HOSPITAL_COMMUNITY): Payer: Self-pay | Admitting: Psychiatry

## 2019-06-03 ENCOUNTER — Telehealth (HOSPITAL_COMMUNITY): Payer: Self-pay | Admitting: Psychiatry

## 2019-06-03 MED ORDER — BUSPIRONE HCL 30 MG PO TABS: ORAL_TABLET | ORAL | 0 refills | Status: DC

## 2019-06-03 MED ORDER — BUPROPION HCL ER (SR) 100 MG PO TB12: 100.0000 mg | ORAL_TABLET | Freq: Two times a day (BID) | ORAL | 1 refills | Status: DC

## 2019-06-03 MED ORDER — LAMOTRIGINE 100 MG PO TABS: 100.0000 mg | ORAL_TABLET | Freq: Every day | ORAL | 0 refills | Status: DC

## 2019-06-03 NOTE — Telephone Encounter (Signed)
Spoke with patient and she is going to be using the mail order pharmacy [facility]. I resent medications to that pharmacy. Patient is no longer taking Cymbalta per last office visit note. Nothing further is needed at this time.

## 2019-06-03 NOTE — Telephone Encounter (Signed)
Kailyn from [facility] called. Pt needs refills on bupropion, buspar, lamotrigine, cymbalta.   Fax # [phone] Phone# [phone]

## 2019-07-13 ENCOUNTER — Other Ambulatory Visit (HOSPITAL_COMMUNITY): Payer: Self-pay | Admitting: Psychiatry

## 2019-07-16 ENCOUNTER — Other Ambulatory Visit (HOSPITAL_COMMUNITY): Payer: Self-pay | Admitting: Psychiatry

## 2019-08-14 ENCOUNTER — Other Ambulatory Visit (HOSPITAL_COMMUNITY): Payer: Self-pay | Admitting: Psychiatry

## 2019-08-21 ENCOUNTER — Other Ambulatory Visit (HOSPITAL_COMMUNITY): Payer: Self-pay | Admitting: Psychiatry

## 2019-08-23 ENCOUNTER — Other Ambulatory Visit (HOSPITAL_COMMUNITY): Payer: Self-pay | Admitting: Psychiatry

## 2019-08-26 ENCOUNTER — Encounter (HOSPITAL_COMMUNITY): Payer: Self-pay | Admitting: Psychiatry

## 2019-08-26 ENCOUNTER — Telehealth (INDEPENDENT_AMBULATORY_CARE_PROVIDER_SITE_OTHER): Payer: Medicare Other | Admitting: Psychiatry

## 2019-08-26 DIAGNOSIS — G894 Chronic pain syndrome: Secondary | ICD-10-CM | POA: Diagnosis not present

## 2019-08-26 DIAGNOSIS — F331 Major depressive disorder, recurrent, moderate: Secondary | ICD-10-CM | POA: Diagnosis not present

## 2019-08-26 DIAGNOSIS — F411 Generalized anxiety disorder: Secondary | ICD-10-CM | POA: Diagnosis not present

## 2019-08-26 NOTE — Progress Notes (Signed)
[facility] Outpatient Follow up visit   Patient Identification: Chelsey Castro MRN:  [id number] Date of Evaluation:  08/26/2019 Referral Source: Dr/ Jobe Igo.  Chief Complaint:    depression follow up  Visit Diagnosis:    ICD-10-CM   1. Moderate episode of recurrent major depressive disorder (HCC)  F33.1   2. GAD (generalized anxiety disorder)  F41.1   3. Chronic pain syndrome  G89.4         I connected with [name] on 08/26/19 at  8:30 AM EDT by telephone and verified that I am speaking with the correct person using two identifiers.   Patient location : home Provider location : home office   I discussed the limitations, risks, security and privacy concerns of performing an evaluation and management service by telephone and the availability of in person appointments. I also discussed with the patient that there may be a patient responsible charge related to this service. The patient expressed understanding and agreed to proceed.   History of Present Illness:  64  years old currently single Caucasian female living with her ex-husband initialy referred to primary care physician. She has seen Dr. Elvin So for psychiatry in the past     Has multiple back surgeries in  Past. On oxycontin that helps, otherwise  Pain effects mood, also on gaba   Last visit wellbutrin was added, has helped depression, feeling better energy wise as well    Anxiety :fluctuates but not worse Her aggravating factors relocation, mom death,pain  Past Medical History:  Past Medical History:  Diagnosis Date  . Chronic back pain   . Depression   . Diabetes ([place])   . GERD (gastroesophageal reflux disease)   . Hyperlipemia   . OSA (obstructive sleep apnea)     Past Surgical History:  Procedure Laterality Date  . ANKLE SURGERY    . appenedectomy    . BACK SURGERY    . BREAST LUMPECTOMY     bilateral  . GALLBLADDER SURGERY    . hysterectomy    . KNEE SURGERY      Family Psychiatric History: not  know or denies  Family History:  Family History  Problem Relation Age of Onset  . Diabetes Father   . High Cholesterol Father   . Heart disease Father   . Atrial fibrillation Father   . Atrial fibrillation Mother   . Alzheimer's disease Mother   . Cancer Sister   . Heart failure Sister     Social History:   Social History   Socioeconomic History  . Marital status: Single    Spouse name: Not on file  . Number of children: Not on file  . Years of education: Not on file  . Highest education level: Not on file  Occupational History  . Occupation: disabled  Tobacco Use  . Smoking status: Former Smoker    Packs/day: 0.10    Years: 20.00    Pack years: 2.00    Types: Cigarettes  . Smokeless tobacco: Never Used  . Tobacco comment: quit 3 months ago  Vaping Use  . Vaping Use: Never used  Substance and Sexual Activity  . Alcohol use: No  . Drug use: No  . Sexual activity: Not on file  Other Topics Concern  . Not on file  Social History Narrative  . Not on file   Social Determinants of Health   Financial Resource Strain:   . Difficulty of Paying Living Expenses:   Food Insecurity:   . Worried About  Running Out of Food in the Last Year:   . Ran Out of Food in the Last Year:   Transportation Needs:   . Lack of Transportation (Medical):   Marland Kitchen Lack of Transportation (Non-Medical):   Physical Activity:   . Days of Exercise per Week:   . Minutes of Exercise per Session:   Stress:   . Feeling of Stress :   Social Connections:   . Frequency of Communication with Friends and Family:   . Frequency of Social Gatherings with Friends and Family:   . Attends Religious Services:   . Active Member of Clubs or Organizations:   . Attends Banker Meetings:   Marland Kitchen Marital Status:      Allergies:   Allergies  Allergen Reactions  . Bee Venom Anaphylaxis and Swelling  . Erythromycin Hives and Shortness Of Breath  . Lyrica [Pregabalin] Nausea And Vomiting and Rash  .  Sulfa Antibiotics Rash  . Tramadol Rash    Metabolic Disorder Labs: No results found for: HGBA1C, MPG No results found for: PROLACTIN No results found for: CHOL, TRIG, HDL, CHOLHDL, VLDL, LDLCALC   Current Medications: Current Outpatient Medications  Medication Sig Dispense Refill  . buPROPion (WELLBUTRIN SR) 100 MG 12 hr tablet TAKE 1 TABLET BY MOUTH TWICE A DAY 30 tablet 0  . busPIRone (BUSPAR) 30 MG tablet TAKE 1/3 TABLET BY MOUTH THREE TIMES A DAY. 90 tablet 0  . Cyanocobalamin (RA VITAMIN B-12 TR) 1000 MCG TBCR Take 1,000 mcg by mouth daily.    . cycloSPORINE (RESTASIS) 0.05 % ophthalmic emulsion Place 1 drop into both eyes 2 (two) times daily.    Marland Kitchen dexlansoprazole (DEXILANT) 60 MG capsule Take 60 mg by mouth every morning.     . diphenoxylate-atropine (LOMOTIL) 2.5-0.025 MG per tablet Take 2 tablets by mouth 4 (four) times daily as needed for diarrhea or loose stools. 20 tablet 0  . EPIPEN 2-PAK 0.3 MG/0.3ML SOAJ injection Inject 0.3 mg into the muscle once. for allergic reaction  1  . famotidine (PEPCID) 40 MG tablet Take 40 mg by mouth at bedtime.  0  . fluticasone (FLONASE) 50 MCG/ACT nasal spray Place 1 spray into both nostrils daily.    Marland Kitchen gabapentin (NEURONTIN) 300 MG capsule Take 300 mg by mouth 3 (three) times daily.    Marland Kitchen HYDROcodone-acetaminophen (NORCO/VICODIN) 5-325 MG per tablet 1 to 2 tabs every 4 to 6 hours as needed for pain. 20 tablet 0  . lamoTRIgine (LAMICTAL) 100 MG tablet TAKE 1 TABLET BY MOUTH EVERY DAY 90 tablet 0  . Linaclotide (LINZESS) 145 MCG CAPS Take 145 mcg by mouth daily.    . meloxicam (MOBIC) 15 MG tablet Take 15 mg by mouth daily.    . methocarbamol (ROBAXIN-750) 750 MG tablet Take 1 tablet (750 mg total) by mouth 4 (four) times daily. 30 tablet 0  . oxyCODONE-acetaminophen (PERCOCET) 7.5-325 MG tablet     . oxyCODONE-acetaminophen (PERCOCET/ROXICET) 5-325 MG per tablet Take 2 tablets by mouth every 4 (four) hours as needed for severe pain. 15 tablet  0  . PROAIR HFA 108 (90 BASE) MCG/ACT inhaler Inhale 2 puffs into the lungs every 6 (six) hours as needed for wheezing or shortness of breath.   4  . simvastatin (ZOCOR) 20 MG tablet Take 20 mg by mouth every evening.    . sucralfate (CARAFATE) 1 g tablet Take 1 g by mouth 3 (three) times daily.    Marland Kitchen trimethoprim (TRIMPEX) 100 MG tablet Take 100  mg by mouth daily.     No current facility-administered medications for this visit.      Psychiatric Specialty Exam: Review of Systems  Cardiovascular: Negative for chest pain.  Musculoskeletal: Positive for back pain.  Psychiatric/Behavioral: Negative for depression and suicidal ideas.    There were no vitals taken for this visit.There is no height or weight on file to calculate BMI.  General Appearance:   Eye Contact:    Speech:  Normal Rate  Volume:  Decreased  Mood:better  Affect:  congruent  Thought Process:  Goal Directed  Orientation:  Full (Time, Place, and Person)  Thought Content:  Rumination  Suicidal Thoughts:  No  Homicidal Thoughts:  No  Memory:  Immediate;   Fair Recent;   Fair  Judgement:  Fair  Insight:  Fair  Psychomotor Activity:  Decreased  Concentration:  Concentration: Fair and Attention Span: Fair  Recall:  [employer] of Knowledge:Fair  Language: Fair  Akathisia:  Negative  Handed:  Right  AIMS (if indicated):     Assets:  Desire for Improvement  ADL's:  Intact  Cognition: WNL  Sleep:  Variable to fair    Treatment Plan Summary: Medication management and Plan as follows   Depression part of Mood disorder, relavant to pain. Improved, continue wellbutrin lamictal No rash  GAD:: not worse, continue gaba she is taking for pain. Also on buspar  Insomnia: irregular, reviewed sleep hygiene avoid naps during the day. Continue gaba  I discussed the assessment and treatment plan with the patient. The patient was provided an opportunity to ask questions and all were answered. The patient agreed with the  plan and demonstrated an understanding of the instructions.   The patient was advised to call back or seek an in-person evaluation if the symptoms worsen or if the condition fails to improve as anticipated. Fu 60m. Refills sent 2 weeks ago. Non face to face time spent: 15 min Thresa Ross, MD 6/15/20218:38 AM

## 2019-08-27 ENCOUNTER — Other Ambulatory Visit (HOSPITAL_COMMUNITY): Payer: Self-pay | Admitting: Psychiatry

## 2019-09-14 ENCOUNTER — Other Ambulatory Visit (HOSPITAL_COMMUNITY): Payer: Self-pay | Admitting: Psychiatry

## 2019-09-16 ENCOUNTER — Other Ambulatory Visit (HOSPITAL_COMMUNITY): Payer: Self-pay

## 2019-09-16 MED ORDER — BUPROPION HCL ER (SR) 100 MG PO TB12: 100.0000 mg | ORAL_TABLET | Freq: Two times a day (BID) | ORAL | 0 refills | Status: DC

## 2019-09-22 ENCOUNTER — Other Ambulatory Visit (HOSPITAL_COMMUNITY): Payer: Self-pay | Admitting: Psychiatry

## 2019-09-29 ENCOUNTER — Telehealth (HOSPITAL_COMMUNITY): Payer: Self-pay | Admitting: Psychiatry

## 2019-09-29 NOTE — Telephone Encounter (Signed)
She is on multiple medications with sedative effect. Risky to put any for sleep Work on sleep hygiene

## 2019-09-29 NOTE — Telephone Encounter (Signed)
Pt needs something to help her sleep .   Please send to [facility]

## 2019-09-29 NOTE — Telephone Encounter (Signed)
Informed patient of what Dr. Gilmore Laroche stated in previous message and she stated that she will try to work on sleep hygiene. Nothing further is needed at this time.

## 2019-10-14 ENCOUNTER — Other Ambulatory Visit (HOSPITAL_COMMUNITY): Payer: Self-pay | Admitting: Psychiatry

## 2019-10-20 ENCOUNTER — Other Ambulatory Visit (HOSPITAL_COMMUNITY): Payer: Self-pay | Admitting: Psychiatry

## 2019-10-20 ENCOUNTER — Other Ambulatory Visit (HOSPITAL_COMMUNITY): Payer: Self-pay

## 2019-10-20 MED ORDER — BUSPIRONE HCL 30 MG PO TABS: ORAL_TABLET | ORAL | 0 refills | Status: DC

## 2019-10-22 ENCOUNTER — Other Ambulatory Visit (HOSPITAL_COMMUNITY): Payer: Self-pay | Admitting: Psychiatry

## 2019-11-22 ENCOUNTER — Other Ambulatory Visit (HOSPITAL_COMMUNITY): Payer: Self-pay | Admitting: Psychiatry

## 2019-11-26 ENCOUNTER — Telehealth (INDEPENDENT_AMBULATORY_CARE_PROVIDER_SITE_OTHER): Payer: Medicare Other | Admitting: Psychiatry

## 2019-11-26 ENCOUNTER — Encounter (HOSPITAL_COMMUNITY): Payer: Self-pay | Admitting: Psychiatry

## 2019-11-26 DIAGNOSIS — F331 Major depressive disorder, recurrent, moderate: Secondary | ICD-10-CM

## 2019-11-26 DIAGNOSIS — F411 Generalized anxiety disorder: Secondary | ICD-10-CM | POA: Diagnosis not present

## 2019-11-26 DIAGNOSIS — G894 Chronic pain syndrome: Secondary | ICD-10-CM

## 2019-11-26 MED ORDER — BUSPIRONE HCL 30 MG PO TABS: ORAL_TABLET | ORAL | 0 refills | Status: DC

## 2019-11-26 MED ORDER — LAMOTRIGINE 100 MG PO TABS: 100.0000 mg | ORAL_TABLET | Freq: Every day | ORAL | 0 refills | Status: DC

## 2019-11-26 MED ORDER — BUPROPION HCL ER (SR) 100 MG PO TB12: 100.0000 mg | ORAL_TABLET | Freq: Two times a day (BID) | ORAL | 1 refills | Status: DC

## 2019-11-26 NOTE — Progress Notes (Signed)
[facility] Outpatient Follow up visit   Patient Identification: Genea Rheaume MRN:  [id number] Date of Evaluation:  11/26/2019 Referral Source: Dr/ [name].  Chief Complaint:    depression follow up  Visit Diagnosis:    ICD-10-CM   1. Moderate episode of recurrent major depressive disorder (HCC)  F33.1   2. GAD (generalized anxiety disorder)  F41.1   3. Chronic pain syndrome  G89.4      I connected with [name] on 11/26/19 at 10:30 AM EDT by telephone and verified that I am speaking with the correct person using two identifiers.   Patient location : home Provider location : home office   I discussed the limitations, risks, security and privacy concerns of performing an evaluation and management service by telephone and the availability of in person appointments. I also discussed with the patient that there may be a patient responsible charge related to this service. The patient expressed understanding and agreed to proceed.   History of Present Illness:  64  years old currently single Caucasian female living with her ex-husband initialy referred to primary care physician. She has seen Dr. Baldemar Friday for psychiatry in the past     Has multiple back surgeries in  Past. On oxycontin that helps, otherwise  Pain effects mood, also on gaba   Adding wellbutrin has helped depression and tiredness  Duration 5 plus years Severity fluctuates and timing relevant to pain  Anxiety :fluctuates but buspar helps, tries to keep engaged Her aggravating factors:  relocation, mom death, pain  Past Medical History:  Past Medical History:  Diagnosis Date  . Chronic back pain   . Depression   . Diabetes (HCC)   . GERD (gastroesophageal reflux disease)   . Hyperlipemia   . OSA (obstructive sleep apnea)     Past Surgical History:  Procedure Laterality Date  . ANKLE SURGERY    . appenedectomy    . BACK SURGERY    . BREAST LUMPECTOMY     bilateral  . GALLBLADDER SURGERY    . hysterectomy     . KNEE SURGERY      Family Psychiatric History: not know or denies  Family History:  Family History  Problem Relation Age of Onset  . Diabetes Father   . High Cholesterol Father   . Heart disease Father   . Atrial fibrillation Father   . Atrial fibrillation Mother   . Alzheimer's disease Mother   . Cancer Sister   . Heart failure Sister     Social History:   Social History   Socioeconomic History  . Marital status: Single    Spouse name: Not on file  . Number of children: Not on file  . Years of education: Not on file  . Highest education level: Not on file  Occupational History  . Occupation: disabled  Tobacco Use  . Smoking status: Former Smoker    Packs/day: 0.10    Years: 20.00    Pack years: 2.00    Types: Cigarettes  . Smokeless tobacco: Never Used  . Tobacco comment: quit 3 months ago  Vaping Use  . Vaping Use: Never used  Substance and Sexual Activity  . Alcohol use: No  . Drug use: No  . Sexual activity: Not on file  Other Topics Concern  . Not on file  Social History Narrative  . Not on file   Social Determinants of Health   Financial Resource Strain:   . Difficulty of Paying Living Expenses: Not on file  Food Insecurity:   . Worried About Programme researcher, broadcasting/film/video in the Last Year: Not on file  . Ran Out of Food in the Last Year: Not on file  Transportation Needs:   . Lack of Transportation (Medical): Not on file  . Lack of Transportation (Non-Medical): Not on file  Physical Activity:   . Days of Exercise per Week: Not on file  . Minutes of Exercise per Session: Not on file  Stress:   . Feeling of Stress : Not on file  Social Connections:   . Frequency of Communication with Friends and Family: Not on file  . Frequency of Social Gatherings with Friends and Family: Not on file  . Attends Religious Services: Not on file  . Active Member of Clubs or Organizations: Not on file  . Attends Banker Meetings: Not on file  . Marital  Status: Not on file     Allergies:   Allergies  Allergen Reactions  . Bee Venom Anaphylaxis and Swelling  . Erythromycin Hives and Shortness Of Breath  . Lyrica [Pregabalin] Nausea And Vomiting and Rash  . Sulfa Antibiotics Rash  . Tramadol Rash    Metabolic Disorder Labs: No results found for: HGBA1C, MPG No results found for: PROLACTIN No results found for: CHOL, TRIG, HDL, CHOLHDL, VLDL, LDLCALC   Current Medications: Current Outpatient Medications  Medication Sig Dispense Refill  . buPROPion (WELLBUTRIN SR) 100 MG 12 hr tablet Take 1 tablet (100 mg total) by mouth 2 (two) times daily. 60 tablet 1  . busPIRone (BUSPAR) 30 MG tablet TAKE 1/3 TABLET BY MOUTH THREE TIMES A DAY. 90 tablet 0  . Cyanocobalamin (RA VITAMIN B-12 TR) 1000 MCG TBCR Take 1,000 mcg by mouth daily.    . cycloSPORINE (RESTASIS) 0.05 % ophthalmic emulsion Place 1 drop into both eyes 2 (two) times daily.    Marland Kitchen dexlansoprazole (DEXILANT) 60 MG capsule Take 60 mg by mouth every morning.     . diphenoxylate-atropine (LOMOTIL) 2.5-0.025 MG per tablet Take 2 tablets by mouth 4 (four) times daily as needed for diarrhea or loose stools. 20 tablet 0  . EPIPEN 2-PAK 0.3 MG/0.3ML SOAJ injection Inject 0.3 mg into the muscle once. for allergic reaction  1  . famotidine (PEPCID) 40 MG tablet Take 40 mg by mouth at bedtime.  0  . fluticasone (FLONASE) 50 MCG/ACT nasal spray Place 1 spray into both nostrils daily.    Marland Kitchen gabapentin (NEURONTIN) 300 MG capsule Take 300 mg by mouth 3 (three) times daily.    Marland Kitchen HYDROcodone-acetaminophen (NORCO/VICODIN) 5-325 MG per tablet 1 to 2 tabs every 4 to 6 hours as needed for pain. 20 tablet 0  . lamoTRIgine (LAMICTAL) 100 MG tablet Take 1 tablet (100 mg total) by mouth daily. 90 tablet 0  . Linaclotide (LINZESS) 145 MCG CAPS Take 145 mcg by mouth daily.    . meloxicam (MOBIC) 15 MG tablet Take 15 mg by mouth daily.    . methocarbamol (ROBAXIN-750) 750 MG tablet Take 1 tablet (750 mg total)  by mouth 4 (four) times daily. 30 tablet 0  . oxyCODONE-acetaminophen (PERCOCET) 7.5-325 MG tablet     . oxyCODONE-acetaminophen (PERCOCET/ROXICET) 5-325 MG per tablet Take 2 tablets by mouth every 4 (four) hours as needed for severe pain. 15 tablet 0  . PROAIR HFA 108 (90 BASE) MCG/ACT inhaler Inhale 2 puffs into the lungs every 6 (six) hours as needed for wheezing or shortness of breath.   4  . simvastatin (ZOCOR)  20 MG tablet Take 20 mg by mouth every evening.    . sucralfate (CARAFATE) 1 g tablet Take 1 g by mouth 3 (three) times daily.    . traZODone (DESYREL) 100 MG tablet TAKE 1 TABLET (100 MG TOTAL) BY MOUTH AT BEDTIME AS NEEDED FOR SLEEP. 30 tablet 5  . trimethoprim (TRIMPEX) 100 MG tablet Take 100 mg by mouth daily.     No current facility-administered medications for this visit.      Psychiatric Specialty Exam: Review of Systems  Cardiovascular: Negative for chest pain.  Musculoskeletal: Positive for back pain.  Psychiatric/Behavioral: Negative for depression and suicidal ideas.    There were no vitals taken for this visit.There is no height or weight on file to calculate BMI.  General Appearance:   Eye Contact:    Speech:  Normal Rate  Volume:  Decreased  Mood:fair  Affect:  congruent  Thought Process:  Goal Directed  Orientation:  Full (Time, Place, and Person)  Thought Content:  Rumination  Suicidal Thoughts:  No  Homicidal Thoughts:  No  Memory:  Immediate;   Fair Recent;   Fair  Judgement:  Fair  Insight:  Fair  Psychomotor Activity:  Decreased  Concentration:  Concentration: Fair and Attention Span: Fair  Recall:  [employer] of Knowledge:Fair  Language: Fair  Akathisia:  Negative  Handed:  Right  AIMS (if indicated):     Assets:  Desire for Improvement  ADL's:  Intact  Cognition: WNL  Sleep:  Variable to fair    Treatment Plan Summary: Medication management and Plan as follows   Depression part of Mood disorder, relevant to pain but better,  continue wellbutrin GAD::manageable, continue buspar, also on gaba for pain that may help anxiety Insomnia: fluctuates, not worse, continue work on sleep hygiene  I discussed the assessment and treatment plan with the patient. The patient was provided an opportunity to ask questions and all were answered. The patient agreed with the plan and demonstrated an understanding of the instructions.   The patient was advised to call back or seek an in-person evaluation if the symptoms worsen or if the condition fails to improve as anticipated. Fu 42m. Non face to face time spent: 15 min Thresa Ross, MD 9/15/202110:38 AM

## 2020-01-18 ENCOUNTER — Other Ambulatory Visit (HOSPITAL_COMMUNITY): Payer: Self-pay | Admitting: Psychiatry

## 2020-01-19 ENCOUNTER — Other Ambulatory Visit (HOSPITAL_COMMUNITY): Payer: Self-pay

## 2020-01-19 MED ORDER — BUPROPION HCL ER (SR) 100 MG PO TB12: 100.0000 mg | ORAL_TABLET | Freq: Two times a day (BID) | ORAL | 1 refills | Status: DC

## 2020-02-13 ENCOUNTER — Other Ambulatory Visit (HOSPITAL_COMMUNITY): Payer: Self-pay | Admitting: Psychiatry

## 2020-02-16 ENCOUNTER — Other Ambulatory Visit (HOSPITAL_COMMUNITY): Payer: Self-pay

## 2020-02-16 MED ORDER — LAMOTRIGINE 100 MG PO TABS: 100.0000 mg | ORAL_TABLET | Freq: Every day | ORAL | 0 refills | Status: DC

## 2020-03-01 ENCOUNTER — Telehealth (INDEPENDENT_AMBULATORY_CARE_PROVIDER_SITE_OTHER): Payer: Medicare Other | Admitting: Psychiatry

## 2020-03-01 ENCOUNTER — Encounter (HOSPITAL_COMMUNITY): Payer: Self-pay | Admitting: Psychiatry

## 2020-03-01 DIAGNOSIS — F411 Generalized anxiety disorder: Secondary | ICD-10-CM | POA: Diagnosis not present

## 2020-03-01 DIAGNOSIS — F331 Major depressive disorder, recurrent, moderate: Secondary | ICD-10-CM

## 2020-03-01 DIAGNOSIS — F5102 Adjustment insomnia: Secondary | ICD-10-CM

## 2020-03-01 NOTE — Progress Notes (Signed)
[facility] Outpatient Follow up visit   Patient Identification: Chelsey Castro MRN:  [id number] Date of Evaluation:  03/01/2020 Referral Source: Dr/ [name].  Chief Complaint:    depression follow up  Visit Diagnosis:    ICD-10-CM   1. Moderate episode of recurrent major depressive disorder (HCC)  F33.1   2. GAD (generalized anxiety disorder)  F41.1   3. Adjustment insomnia  F51.02        I connected with Chelsey Castro on 03/01/20 at  1:00 PM EST by telephone and verified that I am speaking with the correct person using two identifiers.  I discussed the limitations, risks, security and privacy concerns of performing an evaluation and management service by telephone and the availability of in person appointments. I also discussed with the patient that there may be a patient responsible charge related to this service. The patient expressed understanding and agreed to proceed.  Location patient home Provider home office  History of Present Illness:  64 years old currently single Caucasian female living with her ex-husband initialy referred to primary care physician. She has seen Dr. Baldemar Friday for psychiatry in the past     Pain effects mood, multiple surgeries in the past  wellbutrin has helped winter depression Not on cymbalta now  Severity not worse in regard to depression   Anxiety :fluctuates but takes buspar Her aggravating factors relocation, mom death, pain    Past Medical History:  Past Medical History:  Diagnosis Date  . Chronic back pain   . Depression   . Diabetes (HCC)   . GERD (gastroesophageal reflux disease)   . Hyperlipemia   . OSA (obstructive sleep apnea)     Past Surgical History:  Procedure Laterality Date  . ANKLE SURGERY    . appenedectomy    . BACK SURGERY    . BREAST LUMPECTOMY     bilateral  . GALLBLADDER SURGERY    . hysterectomy    . KNEE SURGERY      Family Psychiatric History: not know or denies  Family History:  Family History   Problem Relation Age of Onset  . Diabetes Father   . High Cholesterol Father   . Heart disease Father   . Atrial fibrillation Father   . Atrial fibrillation Mother   . Alzheimer's disease Mother   . Cancer Sister   . Heart failure Sister     Social History:   Social History   Socioeconomic History  . Marital status: Single    Spouse name: Not on file  . Number of children: Not on file  . Years of education: Not on file  . Highest education level: Not on file  Occupational History  . Occupation: disabled  Tobacco Use  . Smoking status: Former Smoker    Packs/day: 0.10    Years: 20.00    Pack years: 2.00    Types: Cigarettes  . Smokeless tobacco: Never Used  . Tobacco comment: quit 3 months ago  Vaping Use  . Vaping Use: Never used  Substance and Sexual Activity  . Alcohol use: No  . Drug use: No  . Sexual activity: Not on file  Other Topics Concern  . Not on file  Social History Narrative  . Not on file   Social Determinants of Health   Financial Resource Strain: Not on file  Food Insecurity: Not on file  Transportation Needs: Not on file  Physical Activity: Not on file  Stress: Not on file  Social Connections: Not on file  Allergies:   Allergies  Allergen Reactions  . Bee Venom Anaphylaxis and Swelling  . Erythromycin Hives and Shortness Of Breath  . Lyrica [Pregabalin] Nausea And Vomiting and Rash  . Sulfa Antibiotics Rash  . Tramadol Rash    Metabolic Disorder Labs: No results found for: HGBA1C, MPG No results found for: PROLACTIN No results found for: CHOL, TRIG, HDL, CHOLHDL, VLDL, LDLCALC   Current Medications: Current Outpatient Medications  Medication Sig Dispense Refill  . buPROPion (WELLBUTRIN SR) 100 MG 12 hr tablet TAKE 1 TABLET BY MOUTH TWICE A DAY 60 tablet 1  . busPIRone (BUSPAR) 30 MG tablet TAKE 1/3 TABLET BY MOUTH THREE TIMES A DAY. 30 tablet 2  . Cyanocobalamin (RA VITAMIN B-12 TR) 1000 MCG TBCR Take 1,000 mcg by mouth  daily.    . cycloSPORINE (RESTASIS) 0.05 % ophthalmic emulsion Place 1 drop into both eyes 2 (two) times daily.    Marland Kitchen dexlansoprazole (DEXILANT) 60 MG capsule Take 60 mg by mouth every morning.     . diphenoxylate-atropine (LOMOTIL) 2.5-0.025 MG per tablet Take 2 tablets by mouth 4 (four) times daily as needed for diarrhea or loose stools. 20 tablet 0  . EPIPEN 2-PAK 0.3 MG/0.3ML SOAJ injection Inject 0.3 mg into the muscle once. for allergic reaction  1  . famotidine (PEPCID) 40 MG tablet Take 40 mg by mouth at bedtime.  0  . fluticasone (FLONASE) 50 MCG/ACT nasal spray Place 1 spray into both nostrils daily.    Marland Kitchen gabapentin (NEURONTIN) 300 MG capsule Take 300 mg by mouth 3 (three) times daily.    Marland Kitchen HYDROcodone-acetaminophen (NORCO/VICODIN) 5-325 MG per tablet 1 to 2 tabs every 4 to 6 hours as needed for pain. 20 tablet 0  . lamoTRIgine (LAMICTAL) 100 MG tablet Take 1 tablet (100 mg total) by mouth daily. 90 tablet 0  . Linaclotide (LINZESS) 145 MCG CAPS Take 145 mcg by mouth daily.    . meloxicam (MOBIC) 15 MG tablet Take 15 mg by mouth daily.    . methocarbamol (ROBAXIN-750) 750 MG tablet Take 1 tablet (750 mg total) by mouth 4 (four) times daily. 30 tablet 0  . oxyCODONE-acetaminophen (PERCOCET) 7.5-325 MG tablet     . oxyCODONE-acetaminophen (PERCOCET/ROXICET) 5-325 MG per tablet Take 2 tablets by mouth every 4 (four) hours as needed for severe pain. 15 tablet 0  . PROAIR HFA 108 (90 BASE) MCG/ACT inhaler Inhale 2 puffs into the lungs every 6 (six) hours as needed for wheezing or shortness of breath.   4  . simvastatin (ZOCOR) 20 MG tablet Take 20 mg by mouth every evening.    . sucralfate (CARAFATE) 1 g tablet Take 1 g by mouth 3 (three) times daily.    . traZODone (DESYREL) 100 MG tablet TAKE 1 TABLET (100 MG TOTAL) BY MOUTH AT BEDTIME AS NEEDED FOR SLEEP. 30 tablet 5  . trimethoprim (TRIMPEX) 100 MG tablet Take 100 mg by mouth daily.     No current facility-administered medications for  this visit.      Psychiatric Specialty Exam: Review of Systems  Cardiovascular: Negative for chest pain.  Musculoskeletal: Positive for back pain.  Psychiatric/Behavioral: Negative for depression and suicidal ideas.    There were no vitals taken for this visit.There is no height or weight on file to calculate BMI.  General Appearance:   Eye Contact:    Speech:  Normal Rate  Volume:  Decreased  Mood:fair  Affect:  congruent  Thought Process:  Goal Directed  Orientation:  Full (Time, Place, and Person)  Thought Content:  Rumination  Suicidal Thoughts:  No  Homicidal Thoughts:  No  Memory:  Immediate;   Fair Recent;   Fair  Judgement:  Fair  Insight:  Fair  Psychomotor Activity:  Decreased  Concentration:  Concentration: Fair and Attention Span: Fair  Recall:  [employer] of Knowledge:Fair  Language: Fair  Akathisia:  Negative  Handed:  Right  AIMS (if indicated):     Assets:  Desire for Improvement  ADL's:  Intact  Cognition: WNL  Sleep:  Variable to fair    Treatment Plan Summary: Medication management and Plan as follows   Depression part of Mood disorder,  relavant to pain, adding wellbutrin has helped, will continue  GAD:: some better with buspar, also on gaba' Insomnia: irregular, reviewed sleep hygiene avoid naps during the day. Continue gaba  I discussed the assessment and treatment plan with the patient. The patient was provided an opportunity to ask questions and all were answered. The patient agreed with the plan and demonstrated an understanding of the instructions.   The patient was advised to call back or seek an in-person evaluation if the symptoms worsen or if the condition fails to improve as anticipated. Fu 3 m. Sent refills which were due Non face to face time spent: 15 min Thresa Ross, MD 12/20/20211:19 PM

## 2020-04-10 ENCOUNTER — Other Ambulatory Visit (HOSPITAL_COMMUNITY): Payer: Self-pay | Admitting: Psychiatry

## 2020-04-12 ENCOUNTER — Other Ambulatory Visit (HOSPITAL_COMMUNITY): Payer: Self-pay

## 2020-04-12 MED ORDER — BUPROPION HCL ER (SR) 100 MG PO TB12: 100.0000 mg | ORAL_TABLET | Freq: Two times a day (BID) | ORAL | 3 refills | Status: DC

## 2020-04-13 ENCOUNTER — Other Ambulatory Visit (HOSPITAL_COMMUNITY): Payer: Self-pay

## 2020-04-13 MED ORDER — BUSPIRONE HCL 30 MG PO TABS: ORAL_TABLET | ORAL | 5 refills | Status: DC

## 2020-05-07 ENCOUNTER — Other Ambulatory Visit (HOSPITAL_COMMUNITY): Payer: Self-pay | Admitting: Psychiatry

## 2020-05-11 ENCOUNTER — Other Ambulatory Visit (HOSPITAL_COMMUNITY): Payer: Self-pay | Admitting: Psychiatry

## 2020-06-02 ENCOUNTER — Other Ambulatory Visit (HOSPITAL_COMMUNITY): Payer: Self-pay | Admitting: Psychiatry

## 2020-08-06 ENCOUNTER — Other Ambulatory Visit (HOSPITAL_COMMUNITY): Payer: Self-pay | Admitting: Psychiatry

## 2020-10-12 ENCOUNTER — Telehealth (HOSPITAL_COMMUNITY): Payer: Self-pay

## 2020-10-12 DIAGNOSIS — F411 Generalized anxiety disorder: Secondary | ICD-10-CM

## 2020-10-12 MED ORDER — BUSPIRONE HCL 30 MG PO TABS: ORAL_TABLET | ORAL | 0 refills | Status: DC

## 2020-10-12 NOTE — Telephone Encounter (Signed)
A new Buspirone 30 mg, 1/3 tablet 3 times a day, #30 e-scribed to patient's [facility] as verbally approved by Dr.Akhtar this date.

## 2020-10-12 NOTE — Telephone Encounter (Signed)
Medication refill - Fax requesting a refill of pt's prescribed Buspirone, last ordered 04/13/20 + 5 refills and pt next scheduled on 11/01/20

## 2020-11-01 ENCOUNTER — Encounter (HOSPITAL_COMMUNITY): Payer: Self-pay | Admitting: Psychiatry

## 2020-11-01 ENCOUNTER — Telehealth (INDEPENDENT_AMBULATORY_CARE_PROVIDER_SITE_OTHER): Payer: Medicare Other | Admitting: Psychiatry

## 2020-11-01 ENCOUNTER — Other Ambulatory Visit (HOSPITAL_COMMUNITY): Payer: Self-pay | Admitting: Psychiatry

## 2020-11-01 DIAGNOSIS — F331 Major depressive disorder, recurrent, moderate: Secondary | ICD-10-CM | POA: Diagnosis not present

## 2020-11-01 DIAGNOSIS — F411 Generalized anxiety disorder: Secondary | ICD-10-CM

## 2020-11-01 DIAGNOSIS — F5102 Adjustment insomnia: Secondary | ICD-10-CM | POA: Diagnosis not present

## 2020-11-01 MED ORDER — LAMOTRIGINE 100 MG PO TABS: 100.0000 mg | ORAL_TABLET | Freq: Every day | ORAL | 2 refills | Status: DC

## 2020-11-01 MED ORDER — BUSPIRONE HCL 30 MG PO TABS: ORAL_TABLET | ORAL | 0 refills | Status: DC

## 2020-11-01 MED ORDER — BUPROPION HCL ER (SR) 100 MG PO TB12: 100.0000 mg | ORAL_TABLET | Freq: Two times a day (BID) | ORAL | 3 refills | Status: DC

## 2020-11-01 NOTE — Progress Notes (Signed)
[facility] Outpatient Follow up visit   Patient Identification: Chelsey Castro MRN:  [id number] Date of Evaluation:  11/01/2020 Referral Source: Dr/ [name].  Chief Complaint:    depression follow up  Visit Diagnosis:    ICD-10-CM   1. GAD (generalized anxiety disorder)  F41.1 busPIRone (BUSPAR) 30 MG tablet    2. Moderate episode of recurrent major depressive disorder (HCC)  F33.1     3. Adjustment insomnia  F51.02       Virtual Visit via Video Note  I connected with Chelsey Castro on 11/01/20 at 10:30 AM EDT by a video enabled telemedicine application and verified that I am speaking with the correct person using two identifiers.  Location: Patient: home Provider: home office   I discussed the limitations of evaluation and management by telemedicine and the availability of in person appointments. The patient expressed understanding and agreed to proceed.     I discussed the assessment and treatment plan with the patient. The patient was provided an opportunity to ask questions and all were answered. The patient agreed with the plan and demonstrated an understanding of the instructions.   The patient was advised to call back or seek an in-person evaluation if the symptoms worsen or if the condition fails to improve as anticipated.  I provided 15 minutes of non-face-to-face time during this encounter.   Thresa Ross, MD     History of Present Illness:  65 years old currently single Caucasian female living with her ex-husband initialy referred to primary care physician. She has seen Dr. Baldemar Friday for psychiatry in the past     Pain effects mood, multiple surgeries in the past Overall medication keep some balance when Wellbutrin helps with the winter depression discussed briefly regarding pain she is on pain medications sometimes she has a bad day but overall she does have better days as well  There is no rash or side effects reported on Lamictal   Anxiety :fluctuates but takes  buspar Her aggravating factors : Relocation mom's death, pain    Past Medical History:  Past Medical History:  Diagnosis Date   Chronic back pain    Depression    Diabetes (HCC)    GERD (gastroesophageal reflux disease)    Hyperlipemia    OSA (obstructive sleep apnea)     Past Surgical History:  Procedure Laterality Date   ANKLE SURGERY     appenedectomy     BACK SURGERY     BREAST LUMPECTOMY     bilateral   GALLBLADDER SURGERY     hysterectomy     KNEE SURGERY      Family Psychiatric History: not know or denies  Family History:  Family History  Problem Relation Age of Onset   Diabetes Father    High Cholesterol Father    Heart disease Father    Atrial fibrillation Father    Atrial fibrillation Mother    Alzheimer's disease Mother    Cancer Sister    Heart failure Sister     Social History:   Social History   Socioeconomic History   Marital status: Single    Spouse name: Not on file   Number of children: Not on file   Years of education: Not on file   Highest education level: Not on file  Occupational History   Occupation: disabled  Tobacco Use   Smoking status: Former    Packs/day: 0.10    Years: 20.00    Pack years: 2.00    Types: Cigarettes  Smokeless tobacco: Never   Tobacco comments:    quit 3 months ago  Vaping Use   Vaping Use: Never used  Substance and Sexual Activity   Alcohol use: No   Drug use: No   Sexual activity: Not on file  Other Topics Concern   Not on file  Social History Narrative   Not on file   Social Determinants of Health   Financial Resource Strain: Not on file  Food Insecurity: Not on file  Transportation Needs: Not on file  Physical Activity: Not on file  Stress: Not on file  Social Connections: Not on file     Allergies:   Allergies  Allergen Reactions   Bee Venom Anaphylaxis and Swelling   Erythromycin Hives and Shortness Of Breath   Lyrica [Pregabalin] Nausea And Vomiting and Rash   Sulfa  Antibiotics Rash   Tramadol Rash    Metabolic Disorder Labs: No results found for: HGBA1C, MPG No results found for: PROLACTIN No results found for: CHOL, TRIG, HDL, CHOLHDL, VLDL, LDLCALC   Current Medications: Current Outpatient Medications  Medication Sig Dispense Refill   buPROPion ER (WELLBUTRIN SR) 100 MG 12 hr tablet Take 1 tablet (100 mg total) by mouth 2 (two) times daily. 60 tablet 3   busPIRone (BUSPAR) 30 MG tablet Take 1/3 tablet by mouth 3 times daily 30 tablet 0   Cyanocobalamin (RA VITAMIN B-12 TR) 1000 MCG TBCR Take 1,000 mcg by mouth daily.     cycloSPORINE (RESTASIS) 0.05 % ophthalmic emulsion Place 1 drop into both eyes 2 (two) times daily.     dexlansoprazole (DEXILANT) 60 MG capsule Take 60 mg by mouth every morning.      diphenoxylate-atropine (LOMOTIL) 2.5-0.025 MG per tablet Take 2 tablets by mouth 4 (four) times daily as needed for diarrhea or loose stools. 20 tablet 0   EPIPEN 2-PAK 0.3 MG/0.3ML SOAJ injection Inject 0.3 mg into the muscle once. for allergic reaction  1   famotidine (PEPCID) 40 MG tablet Take 40 mg by mouth at bedtime.  0   fluticasone (FLONASE) 50 MCG/ACT nasal spray Place 1 spray into both nostrils daily.     gabapentin (NEURONTIN) 300 MG capsule Take 300 mg by mouth 3 (three) times daily.     HYDROcodone-acetaminophen (NORCO/VICODIN) 5-325 MG per tablet 1 to 2 tabs every 4 to 6 hours as needed for pain. 20 tablet 0   lamoTRIgine (LAMICTAL) 100 MG tablet Take 1 tablet (100 mg total) by mouth daily. 30 tablet 2   Linaclotide (LINZESS) 145 MCG CAPS Take 145 mcg by mouth daily.     meloxicam (MOBIC) 15 MG tablet Take 15 mg by mouth daily.     methocarbamol (ROBAXIN-750) 750 MG tablet Take 1 tablet (750 mg total) by mouth 4 (four) times daily. 30 tablet 0   oxyCODONE-acetaminophen (PERCOCET) 7.5-325 MG tablet      oxyCODONE-acetaminophen (PERCOCET/ROXICET) 5-325 MG per tablet Take 2 tablets by mouth every 4 (four) hours as needed for severe pain.  15 tablet 0   PROAIR HFA 108 (90 BASE) MCG/ACT inhaler Inhale 2 puffs into the lungs every 6 (six) hours as needed for wheezing or shortness of breath.   4   simvastatin (ZOCOR) 20 MG tablet Take 20 mg by mouth every evening.     sucralfate (CARAFATE) 1 g tablet Take 1 g by mouth 3 (three) times daily.     traZODone (DESYREL) 100 MG tablet TAKE 1 TABLET (100 MG TOTAL) BY MOUTH AT BEDTIME AS  NEEDED FOR SLEEP. 30 tablet 5   trimethoprim (TRIMPEX) 100 MG tablet Take 100 mg by mouth daily.     No current facility-administered medications for this visit.      Psychiatric Specialty Exam: Review of Systems  Cardiovascular:  Negative for chest pain.  Musculoskeletal:  Positive for back pain.  Psychiatric/Behavioral:  Negative for depression and suicidal ideas.    There were no vitals taken for this visit.There is no height or weight on file to calculate BMI.  General Appearance: Casual  Eye Contact: Fair  Speech:  Normal Rate  Volume:  Decreased  Mood:fair  Affect:  congruent  Thought Process:  Goal Directed  Orientation:  Full (Time, Place, and Person)  Thought Content:  Rumination  Suicidal Thoughts:  No  Homicidal Thoughts:  No  Memory:  Immediate;   Fair Recent;   Fair  Judgement:  Fair  Insight:  Fair  Psychomotor Activity:  Decreased  Concentration:  Concentration: Fair and Attention Span: Fair  Recall:  [employer] of Knowledge:Fair  Language: Fair  Akathisia:  Negative  Handed:  Right  AIMS (if indicated):     Assets:  Desire for Improvement  ADL's:  Intact  Cognition: WNL  Sleep:  Variable to fair    Treatment Plan Summary: Medication management and Plan as follows   Prior documentation reviewed Depression part of Mood disorder, relevant to pain continue following up with providers.  Continue BuSpar and Wellbutrin, Lamictal  GAD::  Fluctuates but BuSpar does help also on gabapentin   Discussed distraction from negative thoughts Reviewed medication follow-up in  3 to 4 months or earlier if needed Thresa Ross, MD 8/22/202210:42 AM

## 2020-12-09 ENCOUNTER — Other Ambulatory Visit (HOSPITAL_COMMUNITY): Payer: Self-pay | Admitting: Psychiatry

## 2020-12-09 DIAGNOSIS — F411 Generalized anxiety disorder: Secondary | ICD-10-CM

## 2021-01-08 ENCOUNTER — Other Ambulatory Visit (HOSPITAL_COMMUNITY): Payer: Self-pay | Admitting: Psychiatry

## 2021-01-08 DIAGNOSIS — F411 Generalized anxiety disorder: Secondary | ICD-10-CM

## 2021-01-27 ENCOUNTER — Encounter (HOSPITAL_COMMUNITY): Payer: Self-pay | Admitting: Psychiatry

## 2021-01-27 ENCOUNTER — Telehealth (INDEPENDENT_AMBULATORY_CARE_PROVIDER_SITE_OTHER): Payer: Medicare Other | Admitting: Psychiatry

## 2021-01-27 DIAGNOSIS — F5102 Adjustment insomnia: Secondary | ICD-10-CM

## 2021-01-27 DIAGNOSIS — F331 Major depressive disorder, recurrent, moderate: Secondary | ICD-10-CM | POA: Diagnosis not present

## 2021-01-27 DIAGNOSIS — G894 Chronic pain syndrome: Secondary | ICD-10-CM

## 2021-01-27 DIAGNOSIS — F411 Generalized anxiety disorder: Secondary | ICD-10-CM | POA: Diagnosis not present

## 2021-01-27 MED ORDER — LAMOTRIGINE 100 MG PO TABS: 100.0000 mg | ORAL_TABLET | Freq: Every day | ORAL | 2 refills | Status: DC

## 2021-01-27 MED ORDER — TRAZODONE HCL 50 MG PO TABS: 50.0000 mg | ORAL_TABLET | Freq: Every evening | ORAL | 1 refills | Status: DC | PRN

## 2021-01-27 MED ORDER — BUPROPION HCL ER (SR) 100 MG PO TB12: 100.0000 mg | ORAL_TABLET | Freq: Two times a day (BID) | ORAL | 3 refills | Status: DC

## 2021-01-27 NOTE — Progress Notes (Signed)
[facility] Outpatient Follow up visit   Patient Identification: Chelsey Castro MRN:  [id number] Date of Evaluation:  01/27/2021 Referral Source: Dr/ [name].  Chief Complaint:    depression follow up  Visit Diagnosis:    ICD-10-CM   1. GAD (generalized anxiety disorder)  F41.1     2. Moderate episode of recurrent major depressive disorder (HCC)  F33.1     3. Chronic pain syndrome  G89.4     4. Adjustment insomnia  F51.02      Virtual Visit via Telephone Note  I connected with Chelsey Castro on 01/27/21 at 10:30 AM EST by telephone and verified that I am speaking with the correct person using two identifiers.  Location: Patient: home Provider: offic3   I discussed the limitations, risks, security and privacy concerns of performing an evaluation and management service by telephone and the availability of in person appointments. I also discussed with the patient that there may be a patient responsible charge related to this service. The patient expressed understanding and agreed to proceed.      I discussed the assessment and treatment plan with the patient. The patient was provided an opportunity to ask questions and all were answered. The patient agreed with the plan and demonstrated an understanding of the instructions.   The patient was advised to call back or seek an in-person evaluation if the symptoms worsen or if the condition fails to improve as anticipated.  I provided 11 minutes of non-face-to-face time during this encounter.    History of Present Illness:  65 years old currently single Caucasian female living with her ex-husband initialy referred to primary care physician. She has seen Dr. Baldemar Friday for psychiatry in the past     Pain effects mood, multiple surgeries in the past Feels wellbutrin, lamictal keeps some balance Sleep concerns without trazadone X husband is supportiv There is no rash or side effects reported on Lamictal  Her aggravating factors :  Relocation mom's death, pain    Past Medical History:  Past Medical History:  Diagnosis Date   Chronic back pain    Depression    Diabetes (HCC)    GERD (gastroesophageal reflux disease)    Hyperlipemia    OSA (obstructive sleep apnea)     Past Surgical History:  Procedure Laterality Date   ANKLE SURGERY     appenedectomy     BACK SURGERY     BREAST LUMPECTOMY     bilateral   GALLBLADDER SURGERY     hysterectomy     KNEE SURGERY      Family Psychiatric History: not know or denies  Family History:  Family History  Problem Relation Age of Onset   Diabetes Father    High Cholesterol Father    Heart disease Father    Atrial fibrillation Father    Atrial fibrillation Mother    Alzheimer's disease Mother    Cancer Sister    Heart failure Sister     Social History:   Social History   Socioeconomic History   Marital status: Single    Spouse name: Not on file   Number of children: Not on file   Years of education: Not on file   Highest education level: Not on file  Occupational History   Occupation: disabled  Tobacco Use   Smoking status: Former    Packs/day: 0.10    Years: 20.00    Pack years: 2.00    Types: Cigarettes   Smokeless tobacco: Never   Tobacco comments:  quit 3 months ago  Vaping Use   Vaping Use: Never used  Substance and Sexual Activity   Alcohol use: No   Drug use: No   Sexual activity: Not on file  Other Topics Concern   Not on file  Social History Narrative   Not on file   Social Determinants of Health   Financial Resource Strain: Not on file  Food Insecurity: Not on file  Transportation Needs: Not on file  Physical Activity: Not on file  Stress: Not on file  Social Connections: Not on file     Allergies:   Allergies  Allergen Reactions   Bee Venom Anaphylaxis and Swelling   Erythromycin Hives and Shortness Of Breath   Lyrica [Pregabalin] Nausea And Vomiting and Rash   Sulfa Antibiotics Rash   Tramadol Rash     Metabolic Disorder Labs: No results found for: HGBA1C, MPG No results found for: PROLACTIN No results found for: CHOL, TRIG, HDL, CHOLHDL, VLDL, LDLCALC   Current Medications: Current Outpatient Medications  Medication Sig Dispense Refill   buPROPion ER (WELLBUTRIN SR) 100 MG 12 hr tablet Take 1 tablet (100 mg total) by mouth 2 (two) times daily. 60 tablet 3   busPIRone (BUSPAR) 30 MG tablet TAKE 1/3 TABLET BY MOUTH 3 TIMES DAILY 30 tablet 1   Cyanocobalamin (RA VITAMIN B-12 TR) 1000 MCG TBCR Take 1,000 mcg by mouth daily.     cycloSPORINE (RESTASIS) 0.05 % ophthalmic emulsion Place 1 drop into both eyes 2 (two) times daily.     dexlansoprazole (DEXILANT) 60 MG capsule Take 60 mg by mouth every morning.      diphenoxylate-atropine (LOMOTIL) 2.5-0.025 MG per tablet Take 2 tablets by mouth 4 (four) times daily as needed for diarrhea or loose stools. 20 tablet 0   EPIPEN 2-PAK 0.3 MG/0.3ML SOAJ injection Inject 0.3 mg into the muscle once. for allergic reaction  1   famotidine (PEPCID) 40 MG tablet Take 40 mg by mouth at bedtime.  0   fluticasone (FLONASE) 50 MCG/ACT nasal spray Place 1 spray into both nostrils daily.     gabapentin (NEURONTIN) 300 MG capsule Take 300 mg by mouth 3 (three) times daily.     HYDROcodone-acetaminophen (NORCO/VICODIN) 5-325 MG per tablet 1 to 2 tabs every 4 to 6 hours as needed for pain. 20 tablet 0   lamoTRIgine (LAMICTAL) 100 MG tablet Take 1 tablet (100 mg total) by mouth daily. 30 tablet 2   Linaclotide (LINZESS) 145 MCG CAPS Take 145 mcg by mouth daily.     meloxicam (MOBIC) 15 MG tablet Take 15 mg by mouth daily.     methocarbamol (ROBAXIN-750) 750 MG tablet Take 1 tablet (750 mg total) by mouth 4 (four) times daily. 30 tablet 0   oxyCODONE-acetaminophen (PERCOCET) 7.5-325 MG tablet      oxyCODONE-acetaminophen (PERCOCET/ROXICET) 5-325 MG per tablet Take 2 tablets by mouth every 4 (four) hours as needed for severe pain. 15 tablet 0   PROAIR HFA 108 (90  BASE) MCG/ACT inhaler Inhale 2 puffs into the lungs every 6 (six) hours as needed for wheezing or shortness of breath.   4   simvastatin (ZOCOR) 20 MG tablet Take 20 mg by mouth every evening.     sucralfate (CARAFATE) 1 g tablet Take 1 g by mouth 3 (three) times daily.     traZODone (DESYREL) 50 MG tablet Take 1 tablet (50 mg total) by mouth at bedtime as needed for sleep. 30 tablet 1   trimethoprim (TRIMPEX)  100 MG tablet Take 100 mg by mouth daily.     No current facility-administered medications for this visit.      Psychiatric Specialty Exam: Review of Systems  Cardiovascular:  Negative for chest pain.  Musculoskeletal:  Positive for back pain.  Psychiatric/Behavioral:  Negative for depression and suicidal ideas.    There were no vitals taken for this visit.There is no height or weight on file to calculate BMI.  General Appearance:   Eye Contact:  Speech:  Normal Rate  Volume:  Decreased  Mood:fair  Affect:   Thought Process:  Goal Directed  Orientation:  Full (Time, Place, and Person)  Thought Content:  Rumination  Suicidal Thoughts:  No  Homicidal Thoughts:  No  Memory:  Immediate;   Fair Recent;   Fair  Judgement:  Fair  Insight:  Fair  Psychomotor Activity:  Decreased  Concentration:  Concentration: Fair and Attention Span: Fair  Recall:  [employer] of Knowledge:Fair  Language: Fair  Akathisia:  Negative  Handed:  Right  AIMS (if indicated):     Assets:  Desire for Improvement  ADL's:  Intact  Cognition: WNL  Sleep:  Variable to fair    Treatment Plan Summary: Medication management and Plan as follows   Prior documentation reviewed  Depression part of Mood disorder,  relavant to pain and stressors: continue lamictal, wellbutrin follow up with providers GAD: Fluctuates, continue buspar, also on gaba, work on distraction from negative thoughts  Insomnia: reviewed sleep hygiene; wants to get back on trazadone was on 100mg  will start 50mg  qhs  Reviewed  medication follow-up in 3 to 4 months or earlier if needed , next visit in office , MD 11/17/202210:42 AM

## 2021-02-07 ENCOUNTER — Other Ambulatory Visit (HOSPITAL_COMMUNITY): Payer: Self-pay | Admitting: Psychiatry

## 2021-03-09 ENCOUNTER — Other Ambulatory Visit (HOSPITAL_COMMUNITY): Payer: Self-pay | Admitting: Psychiatry

## 2021-03-09 DIAGNOSIS — F411 Generalized anxiety disorder: Secondary | ICD-10-CM

## 2021-05-05 ENCOUNTER — Ambulatory Visit (INDEPENDENT_AMBULATORY_CARE_PROVIDER_SITE_OTHER): Payer: Medicare Other | Admitting: Psychiatry

## 2021-05-05 ENCOUNTER — Encounter (HOSPITAL_COMMUNITY): Payer: Self-pay | Admitting: Psychiatry

## 2021-05-05 VITALS — BP 128/78 | Temp 98.4°F | Ht 61.0 in | Wt 163.0 lb

## 2021-05-05 DIAGNOSIS — F331 Major depressive disorder, recurrent, moderate: Secondary | ICD-10-CM

## 2021-05-05 DIAGNOSIS — F411 Generalized anxiety disorder: Secondary | ICD-10-CM

## 2021-05-05 DIAGNOSIS — G894 Chronic pain syndrome: Secondary | ICD-10-CM | POA: Diagnosis not present

## 2021-05-05 MED ORDER — BUSPIRONE HCL 30 MG PO TABS: ORAL_TABLET | ORAL | 1 refills | Status: DC

## 2021-05-05 MED ORDER — LAMOTRIGINE 100 MG PO TABS
100.0000 mg | ORAL_TABLET | Freq: Every day | ORAL | 2 refills | Status: DC
Start: 2021-05-05 — End: 2021-08-24

## 2021-05-05 MED ORDER — TRAZODONE HCL 50 MG PO TABS: 50.0000 mg | ORAL_TABLET | Freq: Every evening | ORAL | 1 refills | Status: DC | PRN

## 2021-05-05 MED ORDER — BUPROPION HCL ER (SR) 100 MG PO TB12: 100.0000 mg | ORAL_TABLET | Freq: Two times a day (BID) | ORAL | 1 refills | Status: DC

## 2021-05-05 NOTE — Progress Notes (Signed)
[facility] Outpatient Follow up visit   Patient Identification: Chelsey Castro MRN:  [id number] Date of Evaluation:  05/05/2021 Referral Source: Dr/ [name].  Chief Complaint:    depression follow up  Visit Diagnosis:    ICD-10-CM   1. Moderate episode of recurrent major depressive disorder (HCC)  F33.1     2. GAD (generalized anxiety disorder)  F41.1 busPIRone (BUSPAR) 30 MG tablet    3. Chronic pain syndrome  G89.4          History of Present Illness:  66years old currently single Caucasian female living with her ex-husband initialy referred to primary care physician. She has seen Dr. Baldemar Friday for psychiatry in the past   Was doing fair, suffered constipation had to get admitted, apparently psych meds were not given there and she got upset and depressed Now back on meds, doing fai. Pain limits her movement and has support from x husband Tolerating meds including lamictal, mood is fair   There is no rash or side effects reported on Lamictal  Her aggravating factors :pain, mom.s death Severity manageable except pain flucutates her mood and sleep    Past Medical History:  Past Medical History:  Diagnosis Date   Chronic back pain    Depression    Diabetes (HCC)    GERD (gastroesophageal reflux disease)    Hyperlipemia    OSA (obstructive sleep apnea)     Past Surgical History:  Procedure Laterality Date   ANKLE SURGERY     appenedectomy     BACK SURGERY     BREAST LUMPECTOMY     bilateral   GALLBLADDER SURGERY     hysterectomy     KNEE SURGERY      Family Psychiatric History: not know or denies  Family History:  Family History  Problem Relation Age of Onset   Diabetes Father    High Cholesterol Father    Heart disease Father    Atrial fibrillation Father    Atrial fibrillation Mother    Alzheimer's disease Mother    Cancer Sister    Heart failure Sister     Social History:   Social History   Socioeconomic History   Marital status: Single    Spouse name:  Not on file   Number of children: Not on file   Years of education: Not on file   Highest education level: Not on file  Occupational History   Occupation: disabled  Tobacco Use   Smoking status: Former    Packs/day: 0.10    Years: 20.00    Pack years: 2.00    Types: Cigarettes   Smokeless tobacco: Never   Tobacco comments:    quit 3 months ago  Vaping Use   Vaping Use: Never used  Substance and Sexual Activity   Alcohol use: No   Drug use: No   Sexual activity: Not on file  Other Topics Concern   Not on file  Social History Narrative   Not on file   Social Determinants of Health   Financial Resource Strain: Not on file  Food Insecurity: Not on file  Transportation Needs: Not on file  Physical Activity: Not on file  Stress: Not on file  Social Connections: Not on file     Allergies:   Allergies  Allergen Reactions   Bee Venom Anaphylaxis and Swelling   Erythromycin Hives and Shortness Of Breath   Lyrica [Pregabalin] Nausea And Vomiting and Rash   Sulfa Antibiotics Rash   Tramadol Rash  Metabolic Disorder Labs: No results found for: HGBA1C, MPG No results found for: PROLACTIN No results found for: CHOL, TRIG, HDL, CHOLHDL, VLDL, LDLCALC   Current Medications: Current Outpatient Medications  Medication Sig Dispense Refill   buPROPion ER (WELLBUTRIN SR) 100 MG 12 hr tablet Take 1 tablet (100 mg total) by mouth 2 (two) times daily. 60 tablet 1   busPIRone (BUSPAR) 30 MG tablet Take 1/3 three times a day 30 tablet 1   Cyanocobalamin (RA VITAMIN B-12 TR) 1000 MCG TBCR Take 1,000 mcg by mouth daily.     cycloSPORINE (RESTASIS) 0.05 % ophthalmic emulsion Place 1 drop into both eyes 2 (two) times daily.     dexlansoprazole (DEXILANT) 60 MG capsule Take 60 mg by mouth every morning.      diphenoxylate-atropine (LOMOTIL) 2.5-0.025 MG per tablet Take 2 tablets by mouth 4 (four) times daily as needed for diarrhea or loose stools. 20 tablet 0   EPIPEN 2-PAK 0.3  MG/0.3ML SOAJ injection Inject 0.3 mg into the muscle once. for allergic reaction  1   famotidine (PEPCID) 40 MG tablet Take 40 mg by mouth at bedtime.  0   fluticasone (FLONASE) 50 MCG/ACT nasal spray Place 1 spray into both nostrils daily.     gabapentin (NEURONTIN) 300 MG capsule Take 300 mg by mouth 3 (three) times daily.     HYDROcodone-acetaminophen (NORCO/VICODIN) 5-325 MG per tablet 1 to 2 tabs every 4 to 6 hours as needed for pain. 20 tablet 0   lamoTRIgine (LAMICTAL) 100 MG tablet Take 1 tablet (100 mg total) by mouth daily. 30 tablet 2   Linaclotide (LINZESS) 145 MCG CAPS Take 145 mcg by mouth daily.     meloxicam (MOBIC) 15 MG tablet Take 15 mg by mouth daily.     methocarbamol (ROBAXIN-750) 750 MG tablet Take 1 tablet (750 mg total) by mouth 4 (four) times daily. 30 tablet 0   oxyCODONE-acetaminophen (PERCOCET) 7.5-325 MG tablet      oxyCODONE-acetaminophen (PERCOCET/ROXICET) 5-325 MG per tablet Take 2 tablets by mouth every 4 (four) hours as needed for severe pain. 15 tablet 0   PROAIR HFA 108 (90 BASE) MCG/ACT inhaler Inhale 2 puffs into the lungs every 6 (six) hours as needed for wheezing or shortness of breath.   4   simvastatin (ZOCOR) 20 MG tablet Take 20 mg by mouth every evening.     sucralfate (CARAFATE) 1 g tablet Take 1 g by mouth 3 (three) times daily.     traZODone (DESYREL) 50 MG tablet Take 1 tablet (50 mg total) by mouth at bedtime as needed. for sleep 30 tablet 1   trimethoprim (TRIMPEX) 100 MG tablet Take 100 mg by mouth daily.     No current facility-administered medications for this visit.      Psychiatric Specialty Exam: Review of Systems  Cardiovascular:  Negative for chest pain.  Musculoskeletal:  Positive for back pain.  Neurological:  Negative for tremors.  Psychiatric/Behavioral:  Negative for suicidal ideas.    Blood pressure 128/78, temperature 98.4 F (36.9 C), height 5\' 1"  (1.549 m), weight 163 lb (73.9 kg).Body mass index is 30.8 kg/m.   General Appearance: casual on wheelchair  Eye Contact:fair  Speech:  Normal Rate  Volume:  Decreased  Mood:fair  Affect: congruent  Thought Process:  Goal Directed  Orientation:  Full (Time, Place, and Person)  Thought Content:  Rumination  Suicidal Thoughts:  No  Homicidal Thoughts:  No  Memory:  Immediate;   Fair Recent;  Fair  Judgement:  Fair  Insight:  Fair  Psychomotor Activity:  Decreased  Concentration:  Concentration: Fair and Attention Span: Fair  Recall:  [employer] of Knowledge:Fair  Language: Fair  Akathisia:  Negative  Handed:  Right  AIMS (if indicated):     Assets:  Desire for Improvement  ADL's:  Intact  Cognition: WNL  Sleep:  Variable to fair    Treatment Plan Summary: Medication management and Plan as follows    Prior documentation reviewsed  Depression part of Mood disorder,  relavant to pain : doing fair pain can effect mood Continue wellbutrin, lamictal  GAD: fluctuates, cotninue buspar 10mg  tid or 30mg  qd work on distraction    Insomnia:reviewed sleep hgyien, continue trazadone at night Collaboartion: reviewed meds she is on hydrocodone for pain  Fu 71m Direct care time spent in office including face to face 20 min Thresa Ross, MD 2/23/202310:38 AM

## 2021-05-19 ENCOUNTER — Other Ambulatory Visit (HOSPITAL_COMMUNITY): Payer: Self-pay | Admitting: Psychiatry

## 2021-05-19 DIAGNOSIS — F411 Generalized anxiety disorder: Secondary | ICD-10-CM

## 2021-08-09 ENCOUNTER — Other Ambulatory Visit (HOSPITAL_COMMUNITY): Payer: Self-pay

## 2021-08-09 MED ORDER — TRAZODONE HCL 50 MG PO TABS: 50.0000 mg | ORAL_TABLET | Freq: Every evening | ORAL | 0 refills | Status: DC | PRN

## 2021-08-09 MED ORDER — BUPROPION HCL ER (SR) 100 MG PO TB12: 100.0000 mg | ORAL_TABLET | Freq: Two times a day (BID) | ORAL | 0 refills | Status: DC

## 2021-08-15 ENCOUNTER — Encounter (HOSPITAL_COMMUNITY): Payer: Self-pay

## 2021-08-15 NOTE — Telephone Encounter (Signed)
Opened in error

## 2021-08-24 ENCOUNTER — Other Ambulatory Visit (HOSPITAL_COMMUNITY): Payer: Self-pay

## 2021-08-24 MED ORDER — LAMOTRIGINE 100 MG PO TABS: 100.0000 mg | ORAL_TABLET | Freq: Every day | ORAL | 0 refills | Status: DC

## 2021-08-26 ENCOUNTER — Other Ambulatory Visit (HOSPITAL_COMMUNITY): Payer: Self-pay | Admitting: Psychiatry

## 2021-08-29 ENCOUNTER — Telehealth (HOSPITAL_COMMUNITY): Payer: Self-pay | Admitting: Psychiatry

## 2021-08-29 ENCOUNTER — Encounter (HOSPITAL_COMMUNITY): Payer: Self-pay

## 2021-09-02 ENCOUNTER — Telehealth (INDEPENDENT_AMBULATORY_CARE_PROVIDER_SITE_OTHER): Payer: Medicare Other | Admitting: Psychiatry

## 2021-09-02 ENCOUNTER — Encounter (HOSPITAL_COMMUNITY): Payer: Self-pay | Admitting: Psychiatry

## 2021-09-02 DIAGNOSIS — F331 Major depressive disorder, recurrent, moderate: Secondary | ICD-10-CM | POA: Diagnosis not present

## 2021-09-02 DIAGNOSIS — F411 Generalized anxiety disorder: Secondary | ICD-10-CM

## 2021-09-02 DIAGNOSIS — G894 Chronic pain syndrome: Secondary | ICD-10-CM | POA: Diagnosis not present

## 2021-09-02 DIAGNOSIS — F5102 Adjustment insomnia: Secondary | ICD-10-CM

## 2021-09-02 MED ORDER — BUPROPION HCL ER (SR) 100 MG PO TB12: 100.0000 mg | ORAL_TABLET | Freq: Two times a day (BID) | ORAL | 1 refills | Status: DC

## 2021-09-02 MED ORDER — LAMOTRIGINE 100 MG PO TABS: 100.0000 mg | ORAL_TABLET | Freq: Every day | ORAL | 1 refills | Status: DC

## 2021-09-02 MED ORDER — BUSPIRONE HCL 30 MG PO TABS: ORAL_TABLET | ORAL | 1 refills | Status: DC

## 2021-09-02 MED ORDER — TRAZODONE HCL 50 MG PO TABS: 50.0000 mg | ORAL_TABLET | Freq: Every evening | ORAL | 0 refills | Status: DC | PRN

## 2021-09-24 ENCOUNTER — Other Ambulatory Visit (HOSPITAL_COMMUNITY): Payer: Self-pay | Admitting: Psychiatry

## 2021-09-24 DIAGNOSIS — F411 Generalized anxiety disorder: Secondary | ICD-10-CM

## 2021-09-26 ENCOUNTER — Other Ambulatory Visit (HOSPITAL_COMMUNITY): Payer: Self-pay

## 2021-09-26 MED ORDER — TRAZODONE HCL 50 MG PO TABS: 50.0000 mg | ORAL_TABLET | Freq: Every evening | ORAL | 0 refills | Status: DC | PRN

## 2021-10-13 ENCOUNTER — Other Ambulatory Visit (HOSPITAL_COMMUNITY): Payer: Self-pay | Admitting: *Deleted

## 2021-10-13 MED ORDER — BUPROPION HCL ER (SR) 100 MG PO TB12: 100.0000 mg | ORAL_TABLET | Freq: Two times a day (BID) | ORAL | 1 refills | Status: DC

## 2021-10-17 ENCOUNTER — Telehealth (HOSPITAL_COMMUNITY): Payer: Self-pay

## 2021-10-17 MED ORDER — BUPROPION HCL ER (SR) 100 MG PO TB12: 100.0000 mg | ORAL_TABLET | Freq: Two times a day (BID) | ORAL | 0 refills | Status: DC

## 2021-10-17 NOTE — Telephone Encounter (Signed)
Medication refill - Fax from patient's [facility] requesting a 90 day order for her prescribed Bupropion SR 100 mg, one twice a day.  Last ordered 10/13/21 for 30 days +1 refill and pt to return 12/16/21.

## 2021-10-23 ENCOUNTER — Other Ambulatory Visit (HOSPITAL_COMMUNITY): Payer: Self-pay | Admitting: Psychiatry

## 2021-10-23 DIAGNOSIS — F411 Generalized anxiety disorder: Secondary | ICD-10-CM

## 2021-12-16 ENCOUNTER — Encounter (HOSPITAL_COMMUNITY): Payer: Self-pay | Admitting: Psychiatry

## 2021-12-16 ENCOUNTER — Telehealth (INDEPENDENT_AMBULATORY_CARE_PROVIDER_SITE_OTHER): Payer: Medicare Other | Admitting: Psychiatry

## 2021-12-16 DIAGNOSIS — G894 Chronic pain syndrome: Secondary | ICD-10-CM | POA: Diagnosis not present

## 2021-12-16 DIAGNOSIS — F331 Major depressive disorder, recurrent, moderate: Secondary | ICD-10-CM | POA: Diagnosis not present

## 2021-12-16 DIAGNOSIS — F411 Generalized anxiety disorder: Secondary | ICD-10-CM | POA: Diagnosis not present

## 2021-12-16 DIAGNOSIS — F5102 Adjustment insomnia: Secondary | ICD-10-CM

## 2021-12-16 MED ORDER — BUPROPION HCL ER (SR) 100 MG PO TB12
100.0000 mg | ORAL_TABLET | Freq: Two times a day (BID) | ORAL | 0 refills | Status: DC
Start: 2021-12-16 — End: 2022-04-03

## 2021-12-16 MED ORDER — LAMOTRIGINE 100 MG PO TABS: 100.0000 mg | ORAL_TABLET | Freq: Every day | ORAL | 1 refills | Status: DC

## 2021-12-16 MED ORDER — TRAZODONE HCL 50 MG PO TABS: 75.0000 mg | ORAL_TABLET | Freq: Every evening | ORAL | 0 refills | Status: DC | PRN

## 2021-12-16 MED ORDER — BUSPIRONE HCL 30 MG PO TABS: ORAL_TABLET | ORAL | 2 refills | Status: DC

## 2021-12-16 NOTE — Progress Notes (Signed)
[facility] Outpatient Follow up visit   Patient Identification: Chelsey Castro MRN:  [id number] Date of Evaluation:  12/16/2021 Referral Source: Dr/ [name].  Chief Complaint:    depression follow up , sleep issues Visit Diagnosis:    ICD-10-CM   1. Moderate episode of recurrent major depressive disorder (HCC)  F33.1     2. GAD (generalized anxiety disorder)  F41.1 busPIRone (BUSPAR) 30 MG tablet    3. Chronic pain syndrome  G89.4     4. Adjustment insomnia  F51.02      Virtual Visit via Video Note  I connected with Chelsey Castro on 12/16/21 at 10:30 AM EDT by a video enabled telemedicine application and verified that I am speaking with the correct person using two identifiers.  Location: Patient: home Provider: home office   I discussed the limitations of evaluation and management by telemedicine and the availability of in person appointments. The patient expressed understanding and agreed to proceed.      I discussed the assessment and treatment plan with the patient. The patient was provided an opportunity to ask questions and all were answered. The patient agreed with the plan and demonstrated an understanding of the instructions.   The patient was advised to call back or seek an in-person evaluation if the symptoms worsen or if the condition fails to improve as anticipated.  I provided 20 minutes plus of non-face-to-face time during this encounter.      History of Present Illness:  66years old currently single Caucasian female living with her ex-husband initialy referred to primary care physician. She has seen Dr. Baldemar Friday for psychiatry in the past    Pain effects mood, now on patch and follows with providers  Mood is fair but difficulty ambulation effects mood Sleep remains concerns despite on trazadone, understands to not lie in bed during the day  No rash on lamictal Modifying factor: x husband Her aggravating factors : mom's death Severity manageable , but poor  sleep   Past Medical History:  Past Medical History:  Diagnosis Date   Chronic back pain    Depression    Diabetes (HCC)    GERD (gastroesophageal reflux disease)    Hyperlipemia    OSA (obstructive sleep apnea)     Past Surgical History:  Procedure Laterality Date   ANKLE SURGERY     appenedectomy     BACK SURGERY     BREAST LUMPECTOMY     bilateral   GALLBLADDER SURGERY     hysterectomy     KNEE SURGERY      Family Psychiatric History: not know or denies  Family History:  Family History  Problem Relation Age of Onset   Diabetes Father    High Cholesterol Father    Heart disease Father    Atrial fibrillation Father    Atrial fibrillation Mother    Alzheimer's disease Mother    Cancer Sister    Heart failure Sister     Social History:   Social History   Socioeconomic History   Marital status: Single    Spouse name: Not on file   Number of children: Not on file   Years of education: Not on file   Highest education level: Not on file  Occupational History   Occupation: disabled  Tobacco Use   Smoking status: Former    Packs/day: 0.10    Years: 20.00    Total pack years: 2.00    Types: Cigarettes   Smokeless tobacco: Never   Tobacco comments:  quit 3 months ago  Vaping Use   Vaping Use: Never used  Substance and Sexual Activity   Alcohol use: No   Drug use: No   Sexual activity: Not on file  Other Topics Concern   Not on file  Social History Narrative   Not on file   Social Determinants of Health   Financial Resource Strain: Not on file  Food Insecurity: Not on file  Transportation Needs: Not on file  Physical Activity: Not on file  Stress: Not on file  Social Connections: Not on file     Allergies:   Allergies  Allergen Reactions   Bee Venom Anaphylaxis and Swelling   Erythromycin Hives and Shortness Of Breath   Lyrica [Pregabalin] Nausea And Vomiting and Rash   Sulfa Antibiotics Rash   Tramadol Rash    Metabolic Disorder  Labs: No results found for: "HGBA1C", "MPG" No results found for: "PROLACTIN" No results found for: "CHOL", "TRIG", "HDL", "CHOLHDL", "VLDL", "LDLCALC"   Current Medications: Current Outpatient Medications  Medication Sig Dispense Refill   buPROPion ER (WELLBUTRIN SR) 100 MG 12 hr tablet Take 1 tablet (100 mg total) by mouth 2 (two) times daily. 180 tablet 0   busPIRone (BUSPAR) 30 MG tablet TAKE 1/3 TABLET BY MOUTH THREE TIMES A DAY 90 tablet 2   Cyanocobalamin (RA VITAMIN B-12 TR) 1000 MCG TBCR Take 1,000 mcg by mouth daily.     cycloSPORINE (RESTASIS) 0.05 % ophthalmic emulsion Place 1 drop into both eyes 2 (two) times daily.     dexlansoprazole (DEXILANT) 60 MG capsule Take 60 mg by mouth every morning.      diphenoxylate-atropine (LOMOTIL) 2.5-0.025 MG per tablet Take 2 tablets by mouth 4 (four) times daily as needed for diarrhea or loose stools. 20 tablet 0   EPIPEN 2-PAK 0.3 MG/0.3ML SOAJ injection Inject 0.3 mg into the muscle once. for allergic reaction  1   famotidine (PEPCID) 40 MG tablet Take 40 mg by mouth at bedtime.  0   fluticasone (FLONASE) 50 MCG/ACT nasal spray Place 1 spray into both nostrils daily.     gabapentin (NEURONTIN) 300 MG capsule Take 300 mg by mouth 3 (three) times daily.     HYDROcodone-acetaminophen (NORCO/VICODIN) 5-325 MG per tablet 1 to 2 tabs every 4 to 6 hours as needed for pain. 20 tablet 0   lamoTRIgine (LAMICTAL) 100 MG tablet Take 1 tablet (100 mg total) by mouth daily. 30 tablet 1   Linaclotide (LINZESS) 145 MCG CAPS Take 145 mcg by mouth daily.     meloxicam (MOBIC) 15 MG tablet Take 15 mg by mouth daily.     methocarbamol (ROBAXIN-750) 750 MG tablet Take 1 tablet (750 mg total) by mouth 4 (four) times daily. 30 tablet 0   PROAIR HFA 108 (90 BASE) MCG/ACT inhaler Inhale 2 puffs into the lungs every 6 (six) hours as needed for wheezing or shortness of breath.   4   simvastatin (ZOCOR) 20 MG tablet Take 20 mg by mouth every evening.     sucralfate  (CARAFATE) 1 g tablet Take 1 g by mouth 3 (three) times daily.     traZODone (DESYREL) 50 MG tablet Take 1.5 tablets (75 mg total) by mouth at bedtime as needed. for sleep 135 tablet 0   trimethoprim (TRIMPEX) 100 MG tablet Take 100 mg by mouth daily.     No current facility-administered medications for this visit.      Psychiatric Specialty Exam: Review of Systems  Cardiovascular:  Negative for chest pain.  Musculoskeletal:  Positive for back pain.  Neurological:  Negative for tremors.  Psychiatric/Behavioral:  Negative for suicidal ideas. The patient has insomnia.     There were no vitals taken for this visit.There is no height or weight on file to calculate BMI.  General Appearance: casual on wheelchair  Eye Contact:fair  Speech:  Normal Rate  Volume:  Decreased  Mood: fair  Affect: congruent  Thought Process:  Goal Directed  Orientation:  Full (Time, Place, and Person)  Thought Content:  Rumination  Suicidal Thoughts:  No  Homicidal Thoughts:  No  Memory:  Immediate;   Fair Recent;   Fair  Judgement:  Fair  Insight:  Fair  Psychomotor Activity:  Decreased  Concentration:  Concentration: Fair and Attention Span: Fair  Recall:  [employer] of Knowledge:Fair  Language: Fair  Akathisia:  Negative  Handed:  Right  AIMS (if indicated):     Assets:  Desire for Improvement  ADL's:  Intact  Cognition: WNL  Sleep:  Variable to fair    Treatment Plan Summary: Medication management and Plan as follows   Prior documentation reviewed  Depression part of Mood disorder,  relavant to pain, discussed follow with providers, continue lamictal, wellbutrin  ONG:EXBM on divided 30mg  but discussed if feel dizzy to tae 1/3rd bid instead of tid  Insomnia:reivewed sleep hygiene, and not to lie in bed during the day, can increase trazadone to 75mg  qhs  Meds due were sent  Fu 43m  Merian Capron, MD 10/6/202310:49 AM

## 2022-03-11 ENCOUNTER — Other Ambulatory Visit (HOSPITAL_COMMUNITY): Payer: Self-pay | Admitting: Psychiatry

## 2022-03-28 ENCOUNTER — Other Ambulatory Visit (HOSPITAL_COMMUNITY): Payer: Self-pay | Admitting: Psychiatry

## 2022-04-03 ENCOUNTER — Other Ambulatory Visit (HOSPITAL_COMMUNITY): Payer: Self-pay

## 2022-04-03 MED ORDER — BUPROPION HCL ER (SR) 100 MG PO TB12: 100.0000 mg | ORAL_TABLET | Freq: Two times a day (BID) | ORAL | 0 refills | Status: DC

## 2022-04-21 ENCOUNTER — Encounter (HOSPITAL_COMMUNITY): Payer: Self-pay | Admitting: Psychiatry

## 2022-04-21 ENCOUNTER — Telehealth (INDEPENDENT_AMBULATORY_CARE_PROVIDER_SITE_OTHER): Payer: 59 | Admitting: Psychiatry

## 2022-04-21 DIAGNOSIS — F331 Major depressive disorder, recurrent, moderate: Secondary | ICD-10-CM | POA: Diagnosis not present

## 2022-04-21 DIAGNOSIS — G894 Chronic pain syndrome: Secondary | ICD-10-CM | POA: Diagnosis not present

## 2022-04-21 DIAGNOSIS — F411 Generalized anxiety disorder: Secondary | ICD-10-CM | POA: Diagnosis not present

## 2022-04-21 DIAGNOSIS — F5102 Adjustment insomnia: Secondary | ICD-10-CM

## 2022-04-21 NOTE — Progress Notes (Signed)
[facility] Outpatient Follow up visit   Patient Identification: Chelsey Castro MRN:  [id number] Date of Evaluation:  04/21/2022 Referral Source: Dr/ Jobe Igo.  Chief Complaint:    depression follow up , sleep issues Visit Diagnosis:    ICD-10-CM   1. Moderate episode of recurrent major depressive disorder (HCC)  F33.1     2. GAD (generalized anxiety disorder)  F41.1     3. Chronic pain syndrome  G89.4     4. Adjustment insomnia  F51.02      Virtual Visit via Video Note  I connected with Chelsey Castro on 04/21/22 at 12:00 PM EST by a video enabled telemedicine application and verified that I am speaking with the correct person using two identifiers.  Location: Patient: home Provider: home office   I discussed the limitations of evaluation and management by telemedicine and the availability of in person appointments. The patient expressed understanding and agreed to proceed.     I discussed the assessment and treatment plan with the patient. The patient was provided an opportunity to ask questions and all were answered. The patient agreed with the plan and demonstrated an understanding of the instructions.   The patient was advised to call back or seek an in-person evaluation if the symptoms worsen or if the condition fails to improve as anticipated.  I provided 15  minutes of non-face-to-face time during this encounter.          History of Present Illness:  67 years old currently single Caucasian female living with her ex-husband initialy referred to primary care physician. She has seen Dr. Elvin So for psychiatry in the past    Pain effects mood, now on sublingual pain med X husband supportive  Mood wise doing manageable and fluctuates anxiety depending on pain and day structure  No rash on lamictal Modifying factor: x husband Her aggravating factors : mom's death Severity manageable , but poor sleep   Past Medical History:  Past Medical History:  Diagnosis Date    Chronic back pain    Depression    Diabetes (HCC)    GERD (gastroesophageal reflux disease)    Hyperlipemia    OSA (obstructive sleep apnea)     Past Surgical History:  Procedure Laterality Date   ANKLE SURGERY     appenedectomy     BACK SURGERY     BREAST LUMPECTOMY     bilateral   GALLBLADDER SURGERY     hysterectomy     KNEE SURGERY      Family Psychiatric History: not know or denies  Family History:  Family History  Problem Relation Age of Onset   Diabetes Father    High Cholesterol Father    Heart disease Father    Atrial fibrillation Father    Atrial fibrillation Mother    Alzheimer's disease Mother    Cancer Sister    Heart failure Sister     Social History:   Social History   Socioeconomic History   Marital status: Single    Spouse name: Not on file   Number of children: Not on file   Years of education: Not on file   Highest education level: Not on file  Occupational History   Occupation: disabled  Tobacco Use   Smoking status: Former    Packs/day: 0.10    Years: 20.00    Total pack years: 2.00    Types: Cigarettes   Smokeless tobacco: Never   Tobacco comments:    quit 3 months ago  Vaping  Use   Vaping Use: Never used  Substance and Sexual Activity   Alcohol use: No   Drug use: No   Sexual activity: Not on file  Other Topics Concern   Not on file  Social History Narrative   Not on file   Social Determinants of Health   Financial Resource Strain: Not on file  Food Insecurity: Not on file  Transportation Needs: Not on file  Physical Activity: Not on file  Stress: Not on file  Social Connections: Not on file     Allergies:   Allergies  Allergen Reactions   Bee Venom Anaphylaxis and Swelling   Erythromycin Hives and Shortness Of Breath   Lyrica [Pregabalin] Nausea And Vomiting and Rash   Sulfa Antibiotics Rash   Tramadol Rash    Metabolic Disorder Labs: No results found for: "HGBA1C", "MPG" No results found for:  "PROLACTIN" No results found for: "CHOL", "TRIG", "HDL", "CHOLHDL", "VLDL", "LDLCALC"   Current Medications: Current Outpatient Medications  Medication Sig Dispense Refill   buPROPion ER (WELLBUTRIN SR) 100 MG 12 hr tablet Take 1 tablet (100 mg total) by mouth 2 (two) times daily. 180 tablet 0   busPIRone (BUSPAR) 30 MG tablet TAKE 1/3 TABLET BY MOUTH THREE TIMES A DAY 90 tablet 2   Cyanocobalamin (RA VITAMIN B-12 TR) 1000 MCG TBCR Take 1,000 mcg by mouth daily.     cycloSPORINE (RESTASIS) 0.05 % ophthalmic emulsion Place 1 drop into both eyes 2 (two) times daily.     dexlansoprazole (DEXILANT) 60 MG capsule Take 60 mg by mouth every morning.      diphenoxylate-atropine (LOMOTIL) 2.5-0.025 MG per tablet Take 2 tablets by mouth 4 (four) times daily as needed for diarrhea or loose stools. 20 tablet 0   EPIPEN 2-PAK 0.3 MG/0.3ML SOAJ injection Inject 0.3 mg into the muscle once. for allergic reaction  1   famotidine (PEPCID) 40 MG tablet Take 40 mg by mouth at bedtime.  0   fluticasone (FLONASE) 50 MCG/ACT nasal spray Place 1 spray into both nostrils daily.     gabapentin (NEURONTIN) 300 MG capsule Take 300 mg by mouth 3 (three) times daily.     HYDROcodone-acetaminophen (NORCO/VICODIN) 5-325 MG per tablet 1 to 2 tabs every 4 to 6 hours as needed for pain. 20 tablet 0   lamoTRIgine (LAMICTAL) 100 MG tablet TAKE 1 TABLET BY MOUTH EVERY DAY 30 tablet 1   Linaclotide (LINZESS) 145 MCG CAPS Take 145 mcg by mouth daily.     meloxicam (MOBIC) 15 MG tablet Take 15 mg by mouth daily.     methocarbamol (ROBAXIN-750) 750 MG tablet Take 1 tablet (750 mg total) by mouth 4 (four) times daily. 30 tablet 0   PROAIR HFA 108 (90 BASE) MCG/ACT inhaler Inhale 2 puffs into the lungs every 6 (six) hours as needed for wheezing or shortness of breath.   4   simvastatin (ZOCOR) 20 MG tablet Take 20 mg by mouth every evening.     sucralfate (CARAFATE) 1 g tablet Take 1 g by mouth 3 (three) times daily.     traZODone  (DESYREL) 50 MG tablet TAKE 1.5 TABLETS (75 MG TOTAL) BY MOUTH AT BEDTIME AS NEEDED FOR SLEEP 135 tablet 0   trimethoprim (TRIMPEX) 100 MG tablet Take 100 mg by mouth daily.     No current facility-administered medications for this visit.      Psychiatric Specialty Exam: Review of Systems  Cardiovascular:  Negative for chest pain.  Musculoskeletal:  Positive  for back pain.  Neurological:  Negative for tremors.  Psychiatric/Behavioral:  Negative for suicidal ideas.     There were no vitals taken for this visit.There is no height or weight on file to calculate BMI.  General Appearance: casual on wheelchair  Eye Contact:fair  Speech:  Normal Rate  Volume:  Decreased  Mood: fair  Affect: congruent  Thought Process:  Goal Directed  Orientation:  Full (Time, Place, and Person)  Thought Content:  Rumination  Suicidal Thoughts:  No  Homicidal Thoughts:  No  Memory:  Immediate;   Fair Recent;   Fair  Judgement:  Fair  Insight:  Fair  Psychomotor Activity:  Decreased  Concentration:  Concentration: Fair and Attention Span: Fair  Recall:  [employer] of Knowledge:Fair  Language: Fair  Akathisia:  Negative  Handed:  Right  AIMS (if indicated):     Assets:  Desire for Improvement  ADL's:  Intact  Cognition: WNL  Sleep:  Variable to fair    Treatment Plan Summary: Medication management and Plan as follows    Prior documentation reviewed  Depression part of Mood disorder, relavant to pain, continue current meds including wellbutrin, lamictal  GAD: manageable on buspar 74m divided per day Insomnia:fair on trazadone, avoid day time sleep, discussed to add activitites  Meds due were sent  Fu 421mNaMerian CapronMD 2/9/202412:02 PM

## 2022-05-22 ENCOUNTER — Other Ambulatory Visit (HOSPITAL_COMMUNITY): Payer: Self-pay | Admitting: Psychiatry

## 2022-06-09 ENCOUNTER — Other Ambulatory Visit (HOSPITAL_COMMUNITY): Payer: Self-pay | Admitting: Psychiatry

## 2022-06-29 ENCOUNTER — Other Ambulatory Visit (HOSPITAL_COMMUNITY): Payer: Self-pay | Admitting: Psychiatry

## 2022-07-15 ENCOUNTER — Other Ambulatory Visit (HOSPITAL_COMMUNITY): Payer: Self-pay | Admitting: Psychiatry

## 2022-08-24 ENCOUNTER — Encounter (HOSPITAL_COMMUNITY): Payer: Self-pay | Admitting: Psychiatry

## 2022-08-24 ENCOUNTER — Telehealth (INDEPENDENT_AMBULATORY_CARE_PROVIDER_SITE_OTHER): Payer: 59 | Admitting: Psychiatry

## 2022-08-24 DIAGNOSIS — F411 Generalized anxiety disorder: Secondary | ICD-10-CM | POA: Diagnosis not present

## 2022-08-24 DIAGNOSIS — F5102 Adjustment insomnia: Secondary | ICD-10-CM | POA: Diagnosis not present

## 2022-08-24 DIAGNOSIS — F331 Major depressive disorder, recurrent, moderate: Secondary | ICD-10-CM | POA: Diagnosis not present

## 2022-08-24 MED ORDER — TRAZODONE HCL 50 MG PO TABS: ORAL_TABLET | ORAL | 0 refills | Status: DC

## 2022-08-24 MED ORDER — LAMOTRIGINE 100 MG PO TABS: 100.0000 mg | ORAL_TABLET | Freq: Every day | ORAL | 1 refills | Status: DC

## 2022-08-24 MED ORDER — BUSPIRONE HCL 30 MG PO TABS: ORAL_TABLET | ORAL | 2 refills | Status: DC

## 2022-08-24 MED ORDER — BUPROPION HCL ER (SR) 100 MG PO TB12: 100.0000 mg | ORAL_TABLET | Freq: Two times a day (BID) | ORAL | 0 refills | Status: DC

## 2022-08-24 NOTE — Progress Notes (Signed)
[facility] Outpatient Follow up visit   Patient Identification: Chelsey Castro MRN:  [id number] Date of Evaluation:  08/24/2022 Referral Source: Dr/ [name].  Chief Complaint:    depression follow up , sleep issues Visit Diagnosis:    ICD-10-CM   1. Moderate episode of recurrent major depressive disorder (HCC)  F33.1     2. GAD (generalized anxiety disorder)  F41.1 busPIRone (BUSPAR) 30 MG tablet    3. Adjustment insomnia  F51.02      Virtual Visit via Video Note  I connected with Chelsey Castro on 08/24/22 at 12:00 PM EDT by a video enabled telemedicine application and verified that I am speaking with the correct person using two identifiers.  Location: Patient: home Provider: office   I discussed the limitations of evaluation and management by telemedicine and the availability of in person appointments. The patient expressed understanding and agreed to proceed.    I discussed the assessment and treatment plan with the patient. The patient was provided an opportunity to ask questions and all were answered. The patient agreed with the plan and demonstrated an understanding of the instructions.   The patient was advised to call back or seek an in-person evaluation if the symptoms worsen or if the condition fails to improve as anticipated.  I provided 15 minutes of non-face-to-face time during this encounter including chart review, documentation      History of Present Illness:  67 years old currently single Caucasian female living with her ex-husband initialy referred to primary care physician. She has seen Dr. Baldemar Friday for psychiatry in the past    Sister had relapse of leukemia so upset about it, but she is taking treatment Overall meds keep balance but pain can effect sleep and mood No rash on lamictal  Modifying factor: x husband Her aggravating factors :  mom's death Severity fair   Past Medical History:  Past Medical History:  Diagnosis Date   Chronic back pain     Depression    Diabetes (HCC)    GERD (gastroesophageal reflux disease)    Hyperlipemia    OSA (obstructive sleep apnea)     Past Surgical History:  Procedure Laterality Date   ANKLE SURGERY     appenedectomy     BACK SURGERY     BREAST LUMPECTOMY     bilateral   GALLBLADDER SURGERY     hysterectomy     KNEE SURGERY      Family Psychiatric History: not know or denies  Family History:  Family History  Problem Relation Age of Onset   Diabetes Father    High Cholesterol Father    Heart disease Father    Atrial fibrillation Father    Atrial fibrillation Mother    Alzheimer's disease Mother    Cancer Sister    Heart failure Sister     Social History:   Social History   Socioeconomic History   Marital status: Single    Spouse name: Not on file   Number of children: Not on file   Years of education: Not on file   Highest education level: Not on file  Occupational History   Occupation: disabled  Tobacco Use   Smoking status: Former    Packs/day: 0.10    Years: 20.00    Additional pack years: 0.00    Total pack years: 2.00    Types: Cigarettes   Smokeless tobacco: Never   Tobacco comments:    quit 3 months ago  Vaping Use   Vaping Use:  Never used  Substance and Sexual Activity   Alcohol use: No   Drug use: No   Sexual activity: Not on file  Other Topics Concern   Not on file  Social History Narrative   Not on file   Social Determinants of Health   Financial Resource Strain: Not on file  Food Insecurity: Not on file  Transportation Needs: Not on file  Physical Activity: Not on file  Stress: Not on file  Social Connections: Not on file     Allergies:   Allergies  Allergen Reactions   Bee Venom Anaphylaxis and Swelling   Erythromycin Hives and Shortness Of Breath   Lyrica [Pregabalin] Nausea And Vomiting and Rash   Sulfa Antibiotics Rash   Tramadol Rash    Metabolic Disorder Labs: No results found for: "HGBA1C", "MPG" No results found for:  "PROLACTIN" No results found for: "CHOL", "TRIG", "HDL", "CHOLHDL", "VLDL", "LDLCALC"   Current Medications: Current Outpatient Medications  Medication Sig Dispense Refill   buPROPion ER (WELLBUTRIN SR) 100 MG 12 hr tablet Take 1 tablet (100 mg total) by mouth 2 (two) times daily. 180 tablet 0   busPIRone (BUSPAR) 30 MG tablet TAKE 1/3 TABLET BY MOUTH THREE TIMES A DAY 90 tablet 2   Cyanocobalamin (RA VITAMIN B-12 TR) 1000 MCG TBCR Take 1,000 mcg by mouth daily.     cycloSPORINE (RESTASIS) 0.05 % ophthalmic emulsion Place 1 drop into both eyes 2 (two) times daily.     dexlansoprazole (DEXILANT) 60 MG capsule Take 60 mg by mouth every morning.      diphenoxylate-atropine (LOMOTIL) 2.5-0.025 MG per tablet Take 2 tablets by mouth 4 (four) times daily as needed for diarrhea or loose stools. 20 tablet 0   EPIPEN 2-PAK 0.3 MG/0.3ML SOAJ injection Inject 0.3 mg into the muscle once. for allergic reaction  1   famotidine (PEPCID) 40 MG tablet Take 40 mg by mouth at bedtime.  0   fluticasone (FLONASE) 50 MCG/ACT nasal spray Place 1 spray into both nostrils daily.     gabapentin (NEURONTIN) 300 MG capsule Take 300 mg by mouth 3 (three) times daily.     HYDROcodone-acetaminophen (NORCO/VICODIN) 5-325 MG per tablet 1 to 2 tabs every 4 to 6 hours as needed for pain. 20 tablet 0   lamoTRIgine (LAMICTAL) 100 MG tablet Take 1 tablet (100 mg total) by mouth daily. 30 tablet 1   Linaclotide (LINZESS) 145 MCG CAPS Take 145 mcg by mouth daily.     meloxicam (MOBIC) 15 MG tablet Take 15 mg by mouth daily.     methocarbamol (ROBAXIN-750) 750 MG tablet Take 1 tablet (750 mg total) by mouth 4 (four) times daily. 30 tablet 0   PROAIR HFA 108 (90 BASE) MCG/ACT inhaler Inhale 2 puffs into the lungs every 6 (six) hours as needed for wheezing or shortness of breath.   4   simvastatin (ZOCOR) 20 MG tablet Take 20 mg by mouth every evening.     sucralfate (CARAFATE) 1 g tablet Take 1 g by mouth 3 (three) times daily.      traZODone (DESYREL) 50 MG tablet TAKE 1 AND 1/2 TABLETS (75 MG TOTAL) BY MOUTH AT BEDTIME AS NEEDED FOR SLEEP 135 tablet 0   trimethoprim (TRIMPEX) 100 MG tablet Take 100 mg by mouth daily.     No current facility-administered medications for this visit.      Psychiatric Specialty Exam: Review of Systems  Cardiovascular:  Negative for chest pain.  Musculoskeletal:  Positive for  back pain.  Neurological:  Negative for tremors.  Psychiatric/Behavioral:  Negative for suicidal ideas.     There were no vitals taken for this visit.There is no height or weight on file to calculate BMI.  General Appearance: casual on wheelchair  Eye Contact:fair  Speech:  Normal Rate  Volume:  Decreased  Mood: fair  Affect: congruent  Thought Process:  Goal Directed  Orientation:  Full (Time, Place, and Person)  Thought Content:  Rumination  Suicidal Thoughts:  No  Homicidal Thoughts:  No  Memory:  Immediate;   Fair Recent;   Fair  Judgement:  Fair  Insight:  Fair  Psychomotor Activity:  Decreased  Concentration:  Concentration: Fair and Attention Span: Fair  Recall:  [employer] of Knowledge:Fair  Language: Fair  Akathisia:  Negative  Handed:  Right  AIMS (if indicated):     Assets:  Desire for Improvement  ADL's:  Intact  Cognition: WNL  Sleep:  Variable to fair    Treatment Plan Summary: Medication management and Plan as follows    Prior documentation reviewed  Depression part of Mood disorder, ; relavant to pain and circumstances. Continue lamictal, wellbutrin and medications   GAD: fair continue 30mg  divided per day. Add activities to distract from worries  Insomnia: fair on trazadone, pain can effect mood or sleep , continue sleep hygiene  Meds refill sent and questions addressed  Fu 48m  Thresa Ross, MD 6/13/202411:58 AM

## 2022-08-25 ENCOUNTER — Telehealth (HOSPITAL_COMMUNITY): Payer: 59 | Admitting: Psychiatry

## 2022-10-04 ENCOUNTER — Other Ambulatory Visit (HOSPITAL_COMMUNITY): Payer: Self-pay | Admitting: Psychiatry

## 2022-11-27 ENCOUNTER — Encounter (HOSPITAL_COMMUNITY): Payer: Self-pay | Admitting: Psychiatry

## 2022-11-27 ENCOUNTER — Telehealth (INDEPENDENT_AMBULATORY_CARE_PROVIDER_SITE_OTHER): Payer: 59 | Admitting: Psychiatry

## 2022-11-27 DIAGNOSIS — G894 Chronic pain syndrome: Secondary | ICD-10-CM

## 2022-11-27 DIAGNOSIS — F411 Generalized anxiety disorder: Secondary | ICD-10-CM

## 2022-11-27 DIAGNOSIS — F5102 Adjustment insomnia: Secondary | ICD-10-CM

## 2022-11-27 DIAGNOSIS — F331 Major depressive disorder, recurrent, moderate: Secondary | ICD-10-CM

## 2022-11-27 MED ORDER — LAMOTRIGINE 150 MG PO TABS: 150.0000 mg | ORAL_TABLET | Freq: Every day | ORAL | 0 refills | Status: DC

## 2022-11-27 MED ORDER — TRAZODONE HCL 50 MG PO TABS: ORAL_TABLET | ORAL | 0 refills | Status: DC

## 2022-11-27 NOTE — Progress Notes (Signed)
[facility] Outpatient Follow up visit   Patient Identification: Chelsey Castro MRN:  [id number] Date of Evaluation:  11/27/2022 Referral Source: Dr/ [name].  Chief Complaint:    depression follow up , sleep issues Visit Diagnosis:    ICD-10-CM   1. GAD (generalized anxiety disorder)  F41.1     2. Moderate episode of recurrent major depressive disorder (HCC)  F33.1     3. Adjustment insomnia  F51.02     4. Chronic pain syndrome  G89.4     Virtual Visit via Video Note  I connected with Chelsey Castro on 11/27/22 at 10:00 AM EDT by a video enabled telemedicine application and verified that I am speaking with the correct person using two identifiers.  Location: Patient: home Provider: home office   I discussed the limitations of evaluation and management by telemedicine and the availability of in person appointments. The patient expressed understanding and agreed to proceed.      I discussed the assessment and treatment plan with the patient. The patient was provided an opportunity to ask questions and all were answered. The patient agreed with the plan and demonstrated an understanding of the instructions.   The patient was advised to call back or seek an in-person evaluation if the symptoms worsen or if the condition fails to improve as anticipated.  I provided 20 minutes of non-face-to-face time during this encounter.       History of Present Illness:  67 years old currently single Caucasian female living with her ex-husband initialy referred to primary care physician. She has seen Dr. Baldemar Friday for psychiatry in the past    Sister had relapse of leukemia so upset about it, but she is taking treatment Has been doing fair, gets upset or edgy at times, dog is sick   Modifying factor: x husand Her aggravating factors :  mom's death Severity gets edgy   Past Medical History:  Past Medical History:  Diagnosis Date   Chronic back pain    Depression    Diabetes (HCC)    GERD  (gastroesophageal reflux disease)    Hyperlipemia    OSA (obstructive sleep apnea)     Past Surgical History:  Procedure Laterality Date   ANKLE SURGERY     appenedectomy     BACK SURGERY     BREAST LUMPECTOMY     bilateral   GALLBLADDER SURGERY     hysterectomy     KNEE SURGERY      Family Psychiatric History: not know or denies  Family History:  Family History  Problem Relation Age of Onset   Diabetes Father    High Cholesterol Father    Heart disease Father    Atrial fibrillation Father    Atrial fibrillation Mother    Alzheimer's disease Mother    Cancer Sister    Heart failure Sister     Social History:   Social History   Socioeconomic History   Marital status: Single    Spouse name: Not on file   Number of children: Not on file   Years of education: Not on file   Highest education level: Not on file  Occupational History   Occupation: disabled  Tobacco Use   Smoking status: Former    Current packs/day: 0.10    Average packs/day: 0.1 packs/day for 20.0 years (2.0 ttl pk-yrs)    Types: Cigarettes   Smokeless tobacco: Never   Tobacco comments:    quit 3 months ago  Vaping Use   Vaping status:  Never Used  Substance and Sexual Activity   Alcohol use: No   Drug use: No   Sexual activity: Not on file  Other Topics Concern   Not on file  Social History Narrative   Not on file   Social Determinants of Health   Financial Resource Strain: Low Risk  (10/02/2022)   Received from [facility]   Overall Financial Resource Strain (CARDIA)    Difficulty of Paying Living Expenses: Not very hard  Food Insecurity: No Food Insecurity (10/02/2022)   [facility]   Hunger Vital Sign    Worried About Running Out of Food in the Last Year: Never true    Ran Out of Food in the Last Year: Never true  Transportation Needs: No Transportation Needs (10/02/2022)   Received from [facility] - Transportation    Lack of Transportation (Medical):  No    Lack of Transportation (Non-Medical): No  Physical Activity: Inactive (10/02/2022)   Received from [facility]   Exercise Vital Sign    Days of Exercise per Week: 0 days    Minutes of Exercise per Session: 20 min  Stress: No Stress Concern Present (10/02/2022)   Received from [facility] - Occupational Stress Questionnaire    Feeling of Stress : Not at all  Social Connections: Socially Integrated (10/02/2022)   Received from [facility]   Social Network    How would you rate your social network (family, work, friends)?: Good participation with social networks     Allergies:   Allergies  Allergen Reactions   Bee Venom Anaphylaxis and Swelling   Erythromycin Hives and Shortness Of Breath   Lyrica [Pregabalin] Nausea And Vomiting and Rash   Sulfa Antibiotics Rash   Tramadol Rash    Metabolic Disorder Labs: No results found for: "HGBA1C", "MPG" No results found for: "PROLACTIN" No results found for: "CHOL", "TRIG", "HDL", "CHOLHDL", "VLDL", "LDLCALC"   Current Medications: Current Outpatient Medications  Medication Sig Dispense Refill   buPROPion ER (WELLBUTRIN SR) 100 MG 12 hr tablet Take 1 tablet (100 mg total) by mouth 2 (two) times daily. 180 tablet 0   busPIRone (BUSPAR) 30 MG tablet TAKE 1/3 TABLET BY MOUTH THREE TIMES A DAY 90 tablet 2   Cyanocobalamin (RA VITAMIN B-12 TR) 1000 MCG TBCR Take 1,000 mcg by mouth daily.     cycloSPORINE (RESTASIS) 0.05 % ophthalmic emulsion Place 1 drop into both eyes 2 (two) times daily.     dexlansoprazole (DEXILANT) 60 MG capsule Take 60 mg by mouth every morning.      diphenoxylate-atropine (LOMOTIL) 2.5-0.025 MG per tablet Take 2 tablets by mouth 4 (four) times daily as needed for diarrhea or loose stools. 20 tablet 0   EPIPEN 2-PAK 0.3 MG/0.3ML SOAJ injection Inject 0.3 mg into the muscle once. for allergic reaction  1   famotidine (PEPCID) 40 MG tablet Take 40 mg by mouth at  bedtime.  0   fluticasone (FLONASE) 50 MCG/ACT nasal spray Place 1 spray into both nostrils daily.     gabapentin (NEURONTIN) 300 MG capsule Take 300 mg by mouth 3 (three) times daily.     HYDROcodone-acetaminophen (NORCO/VICODIN) 5-325 MG per tablet 1 to 2 tabs every 4 to 6 hours as needed for pain. 20 tablet 0   lamoTRIgine (LAMICTAL) 150 MG tablet Take 1 tablet (150 mg total) by mouth daily. 30 tablet 0   Linaclotide (LINZESS) 145 MCG CAPS Take 145  mcg by mouth daily.     meloxicam (MOBIC) 15 MG tablet Take 15 mg by mouth daily.     methocarbamol (ROBAXIN-750) 750 MG tablet Take 1 tablet (750 mg total) by mouth 4 (four) times daily. 30 tablet 0   PROAIR HFA 108 (90 BASE) MCG/ACT inhaler Inhale 2 puffs into the lungs every 6 (six) hours as needed for wheezing or shortness of breath.   4   simvastatin (ZOCOR) 20 MG tablet Take 20 mg by mouth every evening.     sucralfate (CARAFATE) 1 g tablet Take 1 g by mouth 3 (three) times daily.     traZODone (DESYREL) 50 MG tablet TAKE 1 AND 1/2 TABLETS (75 MG TOTAL) BY MOUTH AT BEDTIME AS NEEDED FOR SLEEP 135 tablet 0   trimethoprim (TRIMPEX) 100 MG tablet Take 100 mg by mouth daily.     No current facility-administered medications for this visit.      Psychiatric Specialty Exam: Review of Systems  Cardiovascular:  Negative for chest pain.  Musculoskeletal:  Positive for back pain.  Neurological:  Negative for tremors.  Psychiatric/Behavioral:  Negative for suicidal ideas.     There were no vitals taken for this visit.There is no height or weight on file to calculate BMI.  General Appearance: casual on wheelchair  Eye Contact:fair  Speech:  Normal Rate  Volume:  Decreased  Mood: fair  Affect: congruent  Thought Process:  Goal Directed  Orientation:  Full (Time, Place, and Person)  Thought Content:  Rumination  Suicidal Thoughts:  No  Homicidal Thoughts:  No  Memory:  Immediate;   Fair Recent;   Fair  Judgement:  Fair  Insight:  Fair   Psychomotor Activity:  Decreased  Concentration:  Concentration: Fair and Attention Span: Fair  Recall:  [employer] of Knowledge:Fair  Language: Fair  Akathisia:  Negative  Handed:  Right  AIMS (if indicated):     Assets:  Desire for Improvement  ADL's:  Intact  Cognition: WNL  Sleep:  Variable to fair    Treatment Plan Summary: Medication management and Plan as follows    Prior documentation reviewed  Depression part of Mood disorder, ; relavant to pain and circumstances.  Gets upset easily, will increase lamictal to 150mg , no rash continue other meds  WGN:FAOZ stressed out, continue buspar, consider therapy  Insomnia: fair on trazadone, continue sleep hygiene    Meds refill sent and questions addressed  Fu 47m.   Thresa Ross, MD 9/16/202410:05 AM

## 2022-12-20 ENCOUNTER — Other Ambulatory Visit (HOSPITAL_COMMUNITY): Payer: Self-pay | Admitting: Psychiatry

## 2022-12-31 ENCOUNTER — Other Ambulatory Visit (HOSPITAL_COMMUNITY): Payer: Self-pay | Admitting: Psychiatry

## 2023-01-01 ENCOUNTER — Other Ambulatory Visit (HOSPITAL_COMMUNITY): Payer: Self-pay | Admitting: Psychiatry

## 2023-01-11 ENCOUNTER — Other Ambulatory Visit (HOSPITAL_COMMUNITY): Payer: Self-pay | Admitting: Psychiatry

## 2023-02-21 ENCOUNTER — Encounter (HOSPITAL_COMMUNITY): Payer: Self-pay | Admitting: Psychiatry

## 2023-02-21 ENCOUNTER — Telehealth (HOSPITAL_COMMUNITY): Payer: 59 | Admitting: Psychiatry

## 2023-02-21 DIAGNOSIS — F5102 Adjustment insomnia: Secondary | ICD-10-CM

## 2023-02-21 DIAGNOSIS — F331 Major depressive disorder, recurrent, moderate: Secondary | ICD-10-CM

## 2023-02-21 DIAGNOSIS — F411 Generalized anxiety disorder: Secondary | ICD-10-CM | POA: Diagnosis not present

## 2023-02-21 MED ORDER — TRAZODONE HCL 50 MG PO TABS: ORAL_TABLET | ORAL | 0 refills | Status: DC

## 2023-02-21 MED ORDER — LAMOTRIGINE 150 MG PO TABS: 150.0000 mg | ORAL_TABLET | Freq: Every day | ORAL | 0 refills | Status: DC

## 2023-02-21 MED ORDER — BUSPIRONE HCL 30 MG PO TABS
ORAL_TABLET | ORAL | 2 refills | Status: DC
Start: 2023-02-21 — End: 2023-09-19

## 2023-02-21 NOTE — Progress Notes (Signed)
[facility] Outpatient Follow up visit   Patient Identification: Chelsey Castro MRN:  [id number] Date of Evaluation:  02/21/2023 Referral Source: Dr/ [name].  Chief Complaint:    depression follow up , sleep issues Visit Diagnosis:    ICD-10-CM   1. Moderate episode of recurrent major depressive disorder (HCC)  F33.1     2. GAD (generalized anxiety disorder)  F41.1 busPIRone (BUSPAR) 30 MG tablet    3. Adjustment insomnia  F51.02     Virtual Visit via Video Note  I connected with Chelsey Castro on 02/21/23 at  9:00 AM EST by a video enabled telemedicine application and verified that I am speaking with the correct person using two identifiers.  Location: Patient: home Provider: home office   I discussed the limitations of evaluation and management by telemedicine and the availability of in person appointments. The patient expressed understanding and agreed to proceed.      I discussed the assessment and treatment plan with the patient. The patient was provided an opportunity to ask questions and all were answered. The patient agreed with the plan and demonstrated an understanding of the instructions.   The patient was advised to call back or seek an in-person evaluation if the symptoms worsen or if the condition fails to improve as anticipated.  I provided 20 minutes of non-face-to-face time during this encounter.      History of Present Illness:  67 years old currently single Caucasian female living with her ex-husband initialy referred to primary care physician. She has seen Dr. Baldemar Friday for psychiatry in the past    Sister had relapse of leukemia was upset about it last visit, now doing some better Also patient feels less edgy and increased lamictal has helped, no rash    Modifying factor: x husband Her aggravating factors :  moms death Severity better   Past Medical History:  Past Medical History:  Diagnosis Date   Chronic back pain    Depression    Diabetes (HCC)     GERD (gastroesophageal reflux disease)    Hyperlipemia    OSA (obstructive sleep apnea)     Past Surgical History:  Procedure Laterality Date   ANKLE SURGERY     appenedectomy     BACK SURGERY     BREAST LUMPECTOMY     bilateral   GALLBLADDER SURGERY     hysterectomy     KNEE SURGERY      Family Psychiatric History: not know or denies  Family History:  Family History  Problem Relation Age of Onset   Diabetes Father    High Cholesterol Father    Heart disease Father    Atrial fibrillation Father    Atrial fibrillation Mother    Alzheimer's disease Mother    Cancer Sister    Heart failure Sister     Social History:   Social History   Socioeconomic History   Marital status: Single    Spouse name: Not on file   Number of children: Not on file   Years of education: Not on file   Highest education level: Not on file  Occupational History   Occupation: disabled  Tobacco Use   Smoking status: Former    Current packs/day: 0.10    Average packs/day: 0.1 packs/day for 20.0 years (2.0 ttl pk-yrs)    Types: Cigarettes   Smokeless tobacco: Never   Tobacco comments:    quit 3 months ago  Vaping Use   Vaping status: Never Used  Substance and Sexual  Activity   Alcohol use: No   Drug use: No   Sexual activity: Not on file  Other Topics Concern   Not on file  Social History Narrative   Not on file   Social Determinants of Health   Financial Resource Strain: Low Risk  (10/02/2022)   Received from [facility]   Overall Financial Resource Strain (CARDIA)    Difficulty of Paying Living Expenses: Not very hard  Food Insecurity: No Food Insecurity (10/02/2022)   Received from [facility]   Hunger Vital Sign    Worried About Running Out of Food in the Last Year: Never true    Ran Out of Food in the Last Year: Never true  Transportation Needs: No Transportation Needs (10/02/2022)   Received from [facility] - Transportation    Lack of Transportation  (Medical): No    Lack of Transportation (Non-Medical): No  Physical Activity: Inactive (10/02/2022)   Received from [facility]   Exercise Vital Sign    Days of Exercise per Week: 0 days    Minutes of Exercise per Session: 20 min  Stress: No Stress Concern Present (10/02/2022)   Received from [facility] of Occupational Health - Occupational Stress Questionnaire    Feeling of Stress : Not at all  Social Connections: Socially Integrated (10/02/2022)   Received from [facility]   Social Network    How would you rate your social network (family, work, friends)?: Good participation with social networks     Allergies:   Allergies  Allergen Reactions   Bee Venom Anaphylaxis and Swelling   Erythromycin Hives and Shortness Of Breath   Lyrica [Pregabalin] Nausea And Vomiting and Rash   Sulfa Antibiotics Rash   Tramadol Rash    Metabolic Disorder Labs: No results found for: "HGBA1C", "MPG" No results found for: "PROLACTIN" No results found for: "CHOL", "TRIG", "HDL", "CHOLHDL", "VLDL", "LDLCALC"   Current Medications: Current Outpatient Medications  Medication Sig Dispense Refill   buPROPion ER (WELLBUTRIN SR) 100 MG 12 hr tablet TAKE 1 TABLET BY MOUTH TWICE A DAY 180 tablet 0   busPIRone (BUSPAR) 30 MG tablet TAKE 1/3 TABLET BY MOUTH THREE TIMES A DAY 90 tablet 2   Cyanocobalamin (RA VITAMIN B-12 TR) 1000 MCG TBCR Take 1,000 mcg by mouth daily.     cycloSPORINE (RESTASIS) 0.05 % ophthalmic emulsion Place 1 drop into both eyes 2 (two) times daily.     dexlansoprazole (DEXILANT) 60 MG capsule Take 60 mg by mouth every morning.      diphenoxylate-atropine (LOMOTIL) 2.5-0.025 MG per tablet Take 2 tablets by mouth 4 (four) times daily as needed for diarrhea or loose stools. 20 tablet 0   EPIPEN 2-PAK 0.3 MG/0.3ML SOAJ injection Inject 0.3 mg into the muscle once. for allergic reaction  1   famotidine (PEPCID) 40 MG tablet Take 40 mg by mouth at bedtime.  0    fluticasone (FLONASE) 50 MCG/ACT nasal spray Place 1 spray into both nostrils daily.     gabapentin (NEURONTIN) 300 MG capsule Take 300 mg by mouth 3 (three) times daily.     HYDROcodone-acetaminophen (NORCO/VICODIN) 5-325 MG per tablet 1 to 2 tabs every 4 to 6 hours as needed for pain. 20 tablet 0   lamoTRIgine (LAMICTAL) 150 MG tablet Take 1 tablet (150 mg total) by mouth daily. 30 tablet 0   lamoTRIgine (LAMICTAL) 150 MG tablet Take 1 tablet (150 mg total) by mouth daily. 90 tablet 0  Linaclotide (LINZESS) 145 MCG CAPS Take 145 mcg by mouth daily.     meloxicam (MOBIC) 15 MG tablet Take 15 mg by mouth daily.     methocarbamol (ROBAXIN-750) 750 MG tablet Take 1 tablet (750 mg total) by mouth 4 (four) times daily. 30 tablet 0   PROAIR HFA 108 (90 BASE) MCG/ACT inhaler Inhale 2 puffs into the lungs every 6 (six) hours as needed for wheezing or shortness of breath.   4   simvastatin (ZOCOR) 20 MG tablet Take 20 mg by mouth every evening.     sucralfate (CARAFATE) 1 g tablet Take 1 g by mouth 3 (three) times daily.     traZODone (DESYREL) 50 MG tablet TAKE 1 AND 1/2 TABLETS (75 MG TOTAL) BY MOUTH AT BEDTIME AS NEEDED FOR SLEEP 135 tablet 0   trimethoprim (TRIMPEX) 100 MG tablet Take 100 mg by mouth daily.     No current facility-administered medications for this visit.      Psychiatric Specialty Exam: Review of Systems  Cardiovascular:  Negative for chest pain.  Musculoskeletal:  Positive for back pain.  Neurological:  Negative for tremors.  Psychiatric/Behavioral:  Negative for suicidal ideas.     There were no vitals taken for this visit.There is no height or weight on file to calculate BMI.  General Appearance: casual on wheelchair  Eye Contact:fair  Speech:  Normal Rate  Volume:  Decreased  Mood: fair  Affect: congruent  Thought Process:  Goal Directed  Orientation:  Full (Time, Place, and Person)  Thought Content:  Rumination  Suicidal Thoughts:  No  Homicidal Thoughts:  No   Memory:  Immediate;   Fair Recent;   Fair  Judgement:  Fair  Insight:  Fair  Psychomotor Activity:  Decreased  Concentration:  Concentration: Fair and Attention Span: Fair  Recall:  [employer] of Knowledge:Fair  Language: Fair  Akathisia:  Negative  Handed:  Right  AIMS (if indicated):     Assets:  Desire for Improvement  ADL's:  Intact  Cognition: WNL  Sleep:  Variable to fair    Treatment Plan Summary: Medication management and Plan as follows    Prior documentation reviewed  Depression part of Mood disorder, ; relavant to pain and circumstances.  Overall doing better, continue lamictal for mood and depression  GAD: less edgy, gets anxious, but buspar helps will continue  Insomnia: manageable on trazadone, will continue   Meds refill sent and questions addressed  Fu 49m.   Thresa Ross, MD 12/11/20249:05 AM

## 2023-02-22 ENCOUNTER — Telehealth (HOSPITAL_COMMUNITY): Payer: 59 | Admitting: Psychiatry

## 2023-04-27 ENCOUNTER — Other Ambulatory Visit (HOSPITAL_COMMUNITY): Payer: Self-pay | Admitting: Psychiatry

## 2023-04-28 ENCOUNTER — Encounter (HOSPITAL_COMMUNITY): Payer: Self-pay

## 2023-04-30 ENCOUNTER — Other Ambulatory Visit (HOSPITAL_COMMUNITY): Payer: Self-pay | Admitting: *Deleted

## 2023-04-30 MED ORDER — BUPROPION HCL ER (SR) 100 MG PO TB12: 100.0000 mg | ORAL_TABLET | Freq: Two times a day (BID) | ORAL | 0 refills | Status: DC

## 2023-04-30 NOTE — Telephone Encounter (Signed)
Next Appt 05/23/23  Wellbutrin sent Co sign

## 2023-05-20 ENCOUNTER — Other Ambulatory Visit (HOSPITAL_COMMUNITY): Payer: Self-pay | Admitting: Psychiatry

## 2023-05-23 ENCOUNTER — Telehealth (INDEPENDENT_AMBULATORY_CARE_PROVIDER_SITE_OTHER): Payer: 59 | Admitting: Psychiatry

## 2023-05-23 ENCOUNTER — Encounter (HOSPITAL_COMMUNITY): Payer: Self-pay | Admitting: Psychiatry

## 2023-05-23 DIAGNOSIS — F411 Generalized anxiety disorder: Secondary | ICD-10-CM | POA: Diagnosis not present

## 2023-05-23 DIAGNOSIS — F5102 Adjustment insomnia: Secondary | ICD-10-CM | POA: Diagnosis not present

## 2023-05-23 DIAGNOSIS — F331 Major depressive disorder, recurrent, moderate: Secondary | ICD-10-CM

## 2023-05-23 MED ORDER — LAMOTRIGINE 150 MG PO TABS: 150.0000 mg | ORAL_TABLET | Freq: Every day | ORAL | 0 refills | Status: DC

## 2023-05-23 NOTE — Progress Notes (Signed)
 [facility] Outpatient Follow up visit   Patient Identification: Israa Caban MRN:  [id number] Date of Evaluation:  05/23/2023 Referral Source: Dr/ [name].  Chief Complaint:    depression follow up , sleep issues Visit Diagnosis:    ICD-10-CM   1. Moderate episode of recurrent major depressive disorder (HCC)  F33.1     2. GAD (generalized anxiety disorder)  F41.1     3. Adjustment insomnia  F51.02     Virtual Visit via Video Note  I connected with [name] on 05/23/23 at 10:30 AM EDT by a video enabled telemedicine application and verified that I am speaking with the correct person using two identifiers.  Location: Patient: home Provider: home office   I discussed the limitations of evaluation and management by telemedicine and the availability of in person appointments. The patient expressed understanding and agreed to proceed.     I discussed the assessment and treatment plan with the patient. The patient was provided an opportunity to ask questions and all were answered. The patient agreed with the plan and demonstrated an understanding of the instructions.   The patient was advised to call back or seek an in-person evaluation if the symptoms worsen or if the condition fails to improve as anticipated.  I provided 18 minutes of non-face-to-face time during this encounter.    History of Present Illness:  68 years old currently single Caucasian female living with her ex-husband initialy referred to primary care physician. She has seen Dr. Baldemar Friday for psychiatry in the past    On eval doing fair, talked of death of a family member due to opiods. Handling it fair No rash on meds anxiety is manageable  Modifying factor: x husband Her aggravating factors :  moms death Severity better   Past Medical History:  Past Medical History:  Diagnosis Date   Chronic back pain    Depression    Diabetes (HCC)    GERD (gastroesophageal reflux disease)    Hyperlipemia    OSA  (obstructive sleep apnea)     Past Surgical History:  Procedure Laterality Date   ANKLE SURGERY     appenedectomy     BACK SURGERY     BREAST LUMPECTOMY     bilateral   GALLBLADDER SURGERY     hysterectomy     KNEE SURGERY      Family Psychiatric History: not know or denies  Family History:  Family History  Problem Relation Age of Onset   Diabetes Father    High Cholesterol Father    Heart disease Father    Atrial fibrillation Father    Atrial fibrillation Mother    Alzheimer's disease Mother    Cancer Sister    Heart failure Sister     Social History:   Social History   Socioeconomic History   Marital status: Single    Spouse name: Not on file   Number of children: Not on file   Years of education: Not on file   Highest education level: Not on file  Occupational History   Occupation: disabled  Tobacco Use   Smoking status: Former    Current packs/day: 0.10    Average packs/day: 0.1 packs/day for 20.0 years (2.0 ttl pk-yrs)    Types: Cigarettes   Smokeless tobacco: Never   Tobacco comments:    quit 3 months ago  Vaping Use   Vaping status: Never Used  Substance and Sexual Activity   Alcohol use: No   Drug use: No  Sexual activity: Not on file  Other Topics Concern   Not on file  Social History Narrative   Not on file   Social Drivers of Health   Financial Resource Strain: Low Risk  (04/13/2023)   Received from [facility]   Overall Financial Resource Strain (CARDIA)    Difficulty of Paying Living Expenses: Not hard at all  Food Insecurity: No Food Insecurity (04/13/2023)   Received from [facility]   Hunger Vital Sign    Worried About Running Out of Food in the Last Year: Never true    Ran Out of Food in the Last Year: Never true  Transportation Needs: No Transportation Needs (04/13/2023)   Received [facility] - Transportation    Lack of Transportation (Medical): No    Lack of Transportation (Non-Medical): No  Physical  Activity: Inactive (10/02/2022)   Received from [facility]   Exercise Vital Sign    Days of Exercise per Week: 0 days    Minutes of Exercise per Session: 20 min  Stress: No Stress Concern Present (10/02/2022)   Received from [facility] of Occupational Health - Occupational Stress Questionnaire    Feeling of Stress : Not at all  Social Connections: Socially Integrated (10/02/2022)   Received from [facility]   Social Network    How would you rate your social network (family, work, friends)?: Good participation with social networks     Allergies:   Allergies  Allergen Reactions   Bee Venom Anaphylaxis and Swelling   Erythromycin Hives and Shortness Of Breath   Lyrica [Pregabalin] Nausea And Vomiting and Rash   Sulfa Antibiotics Rash   Tramadol Rash    Metabolic Disorder Labs: No results found for: "HGBA1C", "MPG" No results found for: "PROLACTIN" No results found for: "CHOL", "TRIG", "HDL", "CHOLHDL", "VLDL", "LDLCALC"   Current Medications: Current Outpatient Medications  Medication Sig Dispense Refill   buPROPion ER (WELLBUTRIN SR) 100 MG 12 hr tablet Take 1 tablet (100 mg total) by mouth 2 (two) times daily. 180 tablet 0   busPIRone (BUSPAR) 30 MG tablet TAKE 1/3 TABLET BY MOUTH THREE TIMES A DAY 90 tablet 2   Cyanocobalamin (RA VITAMIN B-12 TR) 1000 MCG TBCR Take 1,000 mcg by mouth daily.     cycloSPORINE (RESTASIS) 0.05 % ophthalmic emulsion Place 1 drop into both eyes 2 (two) times daily.     dexlansoprazole (DEXILANT) 60 MG capsule Take 60 mg by mouth every morning.      diphenoxylate-atropine (LOMOTIL) 2.5-0.025 MG per tablet Take 2 tablets by mouth 4 (four) times daily as needed for diarrhea or loose stools. 20 tablet 0   EPIPEN 2-PAK 0.3 MG/0.3ML SOAJ injection Inject 0.3 mg into the muscle once. for allergic reaction  1   famotidine (PEPCID) 40 MG tablet Take 40 mg by mouth at bedtime.  0   fluticasone (FLONASE) 50 MCG/ACT nasal spray Place  1 spray into both nostrils daily.     gabapentin (NEURONTIN) 300 MG capsule Take 300 mg by mouth 3 (three) times daily.     HYDROcodone-acetaminophen (NORCO/VICODIN) 5-325 MG per tablet 1 to 2 tabs every 4 to 6 hours as needed for pain. 20 tablet 0   lamoTRIgine (LAMICTAL) 150 MG tablet Take 1 tablet (150 mg total) by mouth daily. 30 tablet 0   lamoTRIgine (LAMICTAL) 150 MG tablet Take 1 tablet (150 mg total) by mouth daily. 90 tablet 0   Linaclotide (LINZESS) 145 MCG CAPS Take  145 mcg by mouth daily.     meloxicam (MOBIC) 15 MG tablet Take 15 mg by mouth daily.     methocarbamol (ROBAXIN-750) 750 MG tablet Take 1 tablet (750 mg total) by mouth 4 (four) times daily. 30 tablet 0   PROAIR HFA 108 (90 BASE) MCG/ACT inhaler Inhale 2 puffs into the lungs every 6 (six) hours as needed for wheezing or shortness of breath.   4   simvastatin (ZOCOR) 20 MG tablet Take 20 mg by mouth every evening.     sucralfate (CARAFATE) 1 g tablet Take 1 g by mouth 3 (three) times daily.     traZODone (DESYREL) 50 MG tablet TAKE 1 AND 1/2 TABLETS (75 MG TOTAL) BY MOUTH AT BEDTIME AS NEEDED FOR SLEEP 135 tablet 0   trimethoprim (TRIMPEX) 100 MG tablet Take 100 mg by mouth daily.     No current facility-administered medications for this visit.      Psychiatric Specialty Exam: Review of Systems  Cardiovascular:  Negative for chest pain.  Musculoskeletal:  Positive for back pain.  Neurological:  Negative for tremors.  Psychiatric/Behavioral:  Negative for suicidal ideas.     There were no vitals taken for this visit.There is no height or weight on file to calculate BMI.  General Appearance: casual on wheelchair  Eye Contact:fair  Speech:  Normal Rate  Volume:  Decreased  Mood: fair  Affect: congruent  Thought Process:  Goal Directed  Orientation:  Full (Time, Place, and Person)  Thought Content:  Rumination  Suicidal Thoughts:  No  Homicidal Thoughts:  No  Memory:  Immediate;   Fair Recent;   Fair   Judgement:  Fair  Insight:  Fair  Psychomotor Activity:  Decreased  Concentration:  Concentration: Fair and Attention Span: Fair  Recall:  [employer] of Knowledge:Fair  Language: Fair  Akathisia:  Negative  Handed:  Right  AIMS (if indicated):     Assets:  Desire for Improvement  ADL's:  Intact  Cognition: WNL  Sleep:  Variable to fair    Treatment Plan Summary: Medication management and Plan as follows    Prior documentation reviewed   Depression part of Mood disorder, ; manageable continue lamictal, wellbutrin GAD: less edgy,  manageable anxiety on meds of buspar, will continue Insomnia: fair on trazadone, continue sleep hygiene  Meds refill sent and questions addressed  Fu 3 - 4 m.   Thresa Ross, MD 3/12/202510:34 AM

## 2023-07-26 ENCOUNTER — Other Ambulatory Visit (HOSPITAL_COMMUNITY): Payer: Self-pay | Admitting: Psychiatry

## 2023-08-16 ENCOUNTER — Other Ambulatory Visit (HOSPITAL_COMMUNITY): Payer: Self-pay | Admitting: *Deleted

## 2023-08-16 ENCOUNTER — Telehealth (HOSPITAL_COMMUNITY): Payer: Self-pay | Admitting: *Deleted

## 2023-08-16 MED ORDER — TRAZODONE HCL 50 MG PO TABS: ORAL_TABLET | ORAL | 0 refills | Status: DC

## 2023-08-16 NOTE — Telephone Encounter (Signed)
 Provider Authorized Refill traZODone  (DESYREL ) 50 MG tablet  SENT CO SIGN

## 2023-08-16 NOTE — Telephone Encounter (Signed)
 Rx  Request [facility] [phone] - [place] - [address]    traZODone  (DESYREL ) 50 MG tablet   NEXT APPT 09/19/23 LAST APPT  05/23/23

## 2023-08-19 ENCOUNTER — Other Ambulatory Visit (HOSPITAL_COMMUNITY): Payer: Self-pay | Admitting: Psychiatry

## 2023-09-19 ENCOUNTER — Encounter (HOSPITAL_COMMUNITY): Payer: Self-pay | Admitting: Psychiatry

## 2023-09-19 ENCOUNTER — Telehealth (HOSPITAL_COMMUNITY): Admitting: Psychiatry

## 2023-09-19 DIAGNOSIS — F5102 Adjustment insomnia: Secondary | ICD-10-CM | POA: Diagnosis not present

## 2023-09-19 DIAGNOSIS — F331 Major depressive disorder, recurrent, moderate: Secondary | ICD-10-CM

## 2023-09-19 DIAGNOSIS — F411 Generalized anxiety disorder: Secondary | ICD-10-CM | POA: Diagnosis not present

## 2023-09-19 MED ORDER — BUPROPION HCL ER (SR) 100 MG PO TB12: 100.0000 mg | ORAL_TABLET | Freq: Two times a day (BID) | ORAL | 0 refills | Status: DC

## 2023-09-19 MED ORDER — BUSPIRONE HCL 30 MG PO TABS: ORAL_TABLET | ORAL | 2 refills | Status: DC

## 2023-09-19 NOTE — Progress Notes (Signed)
 [facility] Outpatient Follow up visit   Patient Identification: Chelsey Castro MRN:  [id number] Date of Evaluation:  09/19/2023 Referral Source: Dr/ Marthann.  Chief Complaint:    depression follow up , sleep issues Visit Diagnosis:    ICD-10-CM   1. Moderate episode of recurrent major depressive disorder (HCC)  F33.1     2. GAD (generalized anxiety disorder)  F41.1 busPIRone  (BUSPAR ) 30 MG tablet    3. Adjustment insomnia  F51.02     Virtual Visit via Video Note  I connected with Chelsey Castro on 09/19/23 at 10:30 AM EDT by a video enabled telemedicine application and verified that I am speaking with the correct person using two identifiers.  Location: Patient: home Provider: home office   I discussed the limitations of evaluation and management by telemedicine and the availability of in person appointments. The patient expressed understanding and agreed to proceed.     I discussed the assessment and treatment plan with the patient. The patient was provided an opportunity to ask questions and all were answered. The patient agreed with the plan and demonstrated an understanding of the instructions.   The patient was advised to call back or seek an in-person evaluation if the symptoms worsen or if the condition fails to improve as anticipated.  I provided 18 minutes of non-face-to-face time during this encounter.      History of Present Illness:  68 years old currently single Caucasian female living with her ex-husband initialy referred to primary care physician. She has seen Chelsey Castro for psychiatry in the past   On evaluation doing fairly well tolerating medications.  Her stress level is manageable there is no reported side effects she has got a dog that keeps her busy  Modifying factor: x husband Her aggravating factors : Mom's that Severity better   Past Medical History:  Past Medical History:  Diagnosis Date   Chronic back pain    Depression    Diabetes (HCC)    GERD  (gastroesophageal reflux disease)    Hyperlipemia    OSA (obstructive sleep apnea)     Past Surgical History:  Procedure Laterality Date   ANKLE SURGERY     appenedectomy     BACK SURGERY     BREAST LUMPECTOMY     bilateral   GALLBLADDER SURGERY     hysterectomy     KNEE SURGERY      Family Psychiatric History: not know or denies  Family History:  Family History  Problem Relation Age of Onset   Diabetes Father    High Cholesterol Father    Heart disease Father    Atrial fibrillation Father    Atrial fibrillation Mother    Alzheimer's disease Mother    Cancer Sister    Heart failure Sister     Social History:   Social History   Socioeconomic History   Marital status: Single    Spouse name: Not on file   Number of children: Not on file   Years of education: Not on file   Highest education level: Not on file  Occupational History   Occupation: disabled  Tobacco Use   Smoking status: Former    Current packs/day: 0.10    Average packs/day: 0.1 packs/day for 20.0 years (2.0 ttl pk-yrs)    Types: Cigarettes   Smokeless tobacco: Never   Tobacco comments:    quit 3 months ago  Vaping Use   Vaping status: Never Used  Substance and Sexual Activity   Alcohol use:  No   Drug use: No   Sexual activity: Not on file  Other Topics Concern   Not on file  Social History Narrative   Not on file   Social Drivers of Health   Financial Resource Strain: Low Risk  (06/08/2023)   Received from [facility]   Overall Financial Resource Strain (CARDIA)    Difficulty of Paying Living Expenses: Not very hard  Food Insecurity: Patient Declined (06/08/2023)   Received from [facility]   Hunger Vital Sign    Worried About Running Out of Food in the Last Year: Patient declined    Ran Out of Food in the Last Year: Patient declined  Transportation Needs: No Transportation Needs (06/08/2023)   Received from [facility]   PRAPARE - Transportation    Lack of Transportation  (Medical): No    Lack of Transportation (Non-Medical): No  Physical Activity: Unknown (06/08/2023)   Received from [facility]   Exercise Vital Sign    Days of Exercise per Week: 0 days    Minutes of Exercise per Session: Not on file  Stress: No Stress Concern Present (06/08/2023)   Received from [facility] - Occupational Stress Questionnaire    Feeling of Stress : Not at all  Social Connections: Socially Integrated (06/08/2023)   Received from [facility]   Social Network    How would you rate your social network (family, work, friends)?: Good participation with social networks     Allergies:   Allergies  Allergen Reactions   Bee Venom Anaphylaxis and Swelling   Erythromycin Hives and Shortness Of Breath   Lyrica [Pregabalin] Nausea And Vomiting and Rash   Sulfa Antibiotics Rash   Tramadol Rash    Metabolic Disorder Labs: No results found for: HGBA1C, MPG No results found for: PROLACTIN No results found for: CHOL, TRIG, HDL, CHOLHDL, VLDL, LDLCALC   Current Medications: Current Outpatient Medications  Medication Sig Dispense Refill   buPROPion  ER (WELLBUTRIN  SR) 100 MG 12 hr tablet Take 1 tablet (100 mg total) by mouth 2 (two) times daily. 180 tablet 0   busPIRone  (BUSPAR ) 30 MG tablet TAKE 1/3 TABLET BY MOUTH THREE TIMES A DAY 90 tablet 2   Cyanocobalamin (RA VITAMIN B-12 TR) 1000 MCG TBCR Take 1,000 mcg by mouth daily.     cycloSPORINE (RESTASIS) 0.05 % ophthalmic emulsion Place 1 drop into both eyes 2 (two) times daily.     dexlansoprazole (DEXILANT) 60 MG capsule Take 60 mg by mouth every morning.      diphenoxylate -atropine  (LOMOTIL ) 2.5-0.025 MG per tablet Take 2 tablets by mouth 4 (four) times daily as needed for diarrhea or loose stools. 20 tablet 0   EPIPEN 2-PAK 0.3 MG/0.3ML SOAJ injection Inject 0.3 mg into the muscle once. for allergic reaction  1   famotidine (PEPCID) 40 MG tablet Take 40 mg by  mouth at bedtime.  0   fluticasone (FLONASE) 50 MCG/ACT nasal spray Place 1 spray into both nostrils daily.     gabapentin (NEURONTIN) 300 MG capsule Take 300 mg by mouth 3 (three) times daily.     HYDROcodone -acetaminophen  (NORCO/VICODIN) 5-325 MG per tablet 1 to 2 tabs every 4 to 6 hours as needed for pain. 20 tablet 0   lamoTRIgine  (LAMICTAL ) 150 MG tablet Take 1 tablet (150 mg total) by mouth daily. 30 tablet 0   lamoTRIgine  (LAMICTAL ) 150 MG tablet TAKE 1 TABLET BY MOUTH EVERY DAY 90 tablet 0   Linaclotide (  LINZESS) 145 MCG CAPS Take 145 mcg by mouth daily.     meloxicam (MOBIC) 15 MG tablet Take 15 mg by mouth daily.     methocarbamol  (ROBAXIN -750) 750 MG tablet Take 1 tablet (750 mg total) by mouth 4 (four) times daily. 30 tablet 0   PROAIR HFA 108 (90 BASE) MCG/ACT inhaler Inhale 2 puffs into the lungs every 6 (six) hours as needed for wheezing or shortness of breath.   4   simvastatin (ZOCOR) 20 MG tablet Take 20 mg by mouth every evening.     sucralfate (CARAFATE) 1 g tablet Take 1 g by mouth 3 (three) times daily.     traZODone  (DESYREL ) 50 MG tablet TAKE 1 AND 1/2 TABLETS (75 MG TOTAL) BY MOUTH AT BEDTIME AS NEEDED FOR SLEEP 135 tablet 0   trimethoprim (TRIMPEX) 100 MG tablet Take 100 mg by mouth daily.     No current facility-administered medications for this visit.      Psychiatric Specialty Exam: Review of Systems  Cardiovascular:  Negative for chest pain.  Musculoskeletal:  Positive for back pain.  Neurological:  Negative for tremors.  Psychiatric/Behavioral:  Negative for suicidal ideas.     There were no vitals taken for this visit.There is no height or weight on file to calculate BMI.  General Appearance: casual on wheelchair  Eye Contact:fair  Speech:  Normal Rate  Volume:  Decreased  Mood: fair  Affect: congruent  Thought Process:  Goal Directed  Orientation:  Full (Time, Place, and Person)  Thought Content:  Rumination  Suicidal Thoughts:  No  Homicidal  Thoughts:  No  Memory:  Immediate;   Fair Recent;   Fair  Judgement:  Fair  Insight:  Fair  Psychomotor Activity:  Decreased  Concentration:  Concentration: Fair and Attention Span: Fair  Recall:  [employer] of Knowledge:Fair  Language: Fair  Akathisia:  Negative  Handed:  Right  AIMS (if indicated):     Assets:  Desire for Improvement  ADL's:  Intact  Cognition: WNL  Sleep:  Variable to fair    Treatment Plan Summary: Medication management and Plan as follows   Prior documentation reviewed   Depression part of Mood disorder, ; manageable continue Lamictal  and Wellbutrin  there is no rash reported  GAD: Manageable continue BuSpar  for anxiety and add activities during the day   insomnia: She believes her insomnia is better on trazodone  continue sleep hygiene and trazodone   Reviewed medications questions addressed refill sent if due Fu 3 - 4 m.   Jackey Flight, MD 7/9/202510:41 AM

## 2023-11-11 ENCOUNTER — Other Ambulatory Visit (HOSPITAL_COMMUNITY): Payer: Self-pay | Admitting: Psychiatry

## 2024-01-17 ENCOUNTER — Other Ambulatory Visit (HOSPITAL_COMMUNITY): Payer: Self-pay | Admitting: *Deleted

## 2024-01-17 MED ORDER — BUPROPION HCL ER (SR) 100 MG PO TB12: 100.0000 mg | ORAL_TABLET | Freq: Two times a day (BID) | ORAL | 0 refills | Status: DC

## 2024-01-23 ENCOUNTER — Telehealth (INDEPENDENT_AMBULATORY_CARE_PROVIDER_SITE_OTHER): Admitting: Psychiatry

## 2024-01-23 ENCOUNTER — Encounter (HOSPITAL_COMMUNITY): Payer: Self-pay | Admitting: Psychiatry

## 2024-01-23 DIAGNOSIS — F331 Major depressive disorder, recurrent, moderate: Secondary | ICD-10-CM | POA: Diagnosis not present

## 2024-01-23 DIAGNOSIS — F5102 Adjustment insomnia: Secondary | ICD-10-CM

## 2024-01-23 DIAGNOSIS — F411 Generalized anxiety disorder: Secondary | ICD-10-CM | POA: Diagnosis not present

## 2024-01-23 DIAGNOSIS — G894 Chronic pain syndrome: Secondary | ICD-10-CM | POA: Diagnosis not present

## 2024-01-23 MED ORDER — BUPROPION HCL ER (SR) 100 MG PO TB12: 100.0000 mg | ORAL_TABLET | Freq: Every day | ORAL | 1 refills | Status: DC

## 2024-01-23 MED ORDER — BUSPIRONE HCL 30 MG PO TABS
ORAL_TABLET | ORAL | 2 refills | Status: AC
Start: 2024-01-23 — End: ?

## 2024-01-23 MED ORDER — LAMOTRIGINE 150 MG PO TABS: 150.0000 mg | ORAL_TABLET | Freq: Every day | ORAL | 0 refills | Status: DC

## 2024-01-23 MED ORDER — TRAZODONE HCL 50 MG PO TABS
ORAL_TABLET | ORAL | 0 refills | Status: AC
Start: 2024-01-23 — End: ?

## 2024-01-23 NOTE — Progress Notes (Signed)
 [facility] Outpatient Follow up visit   Patient Identification: Chelsey Castro MRN:  [id number] Date of Evaluation:  01/23/2024 Referral Source: Dr/ Castro.  Chief Complaint:    depression follow up , sleep issues Visit Diagnosis:    ICD-10-CM   1. Moderate episode of recurrent major depressive disorder (HCC)  F33.1     2. GAD (generalized anxiety disorder)  F41.1 busPIRone  (BUSPAR ) 30 MG tablet    3. Adjustment insomnia  F51.02     4. Chronic pain syndrome  G89.4     Virtual Visit via Video Note  I connected with Chelsey Castro on 01/23/24 at 10:30 AM EST by a video enabled telemedicine application and verified that I am speaking with the correct person using two identifiers.  Location: Patient: home Provider: home office   I discussed the limitations of evaluation and management by telemedicine and the availability of in person appointments. The patient expressed understanding and agreed to proceed.      I discussed the assessment and treatment plan with the patient. The patient was provided an opportunity to ask questions and all were answered. The patient agreed with the plan and demonstrated an understanding of the instructions.   The patient was advised to call back or seek an in-person evaluation if the symptoms worsen or if the condition fails to improve as anticipated.  I provided 18 minutes of non-face-to-face time during this encounter.     History of Present Illness:  68 years old currently single Caucasian female living with her ex-husband initialy referred to primary care physician. She has seen Chelsey Castro for psychiatry in the past   On evaluation patient doing reasonable she is tolerating her medications she is taking BuSpar  3 times a day for anxiety also on Wellbutrin  during the daytime  Ex-husband is supportive.  Christmas is around anniversary of her dad's death so somewhat sad about that but she will try to do the best she can  She walks with her  dog  Modifying factor; ex-husband Her aggravating factors :  Severity better   Past Medical History:  Past Medical History:  Diagnosis Date   Chronic back pain    Depression    Diabetes (HCC)    GERD (gastroesophageal reflux disease)    Hyperlipemia    OSA (obstructive sleep apnea)     Past Surgical History:  Procedure Laterality Date   ANKLE SURGERY     appenedectomy     BACK SURGERY     BREAST LUMPECTOMY     bilateral   GALLBLADDER SURGERY     hysterectomy     KNEE SURGERY      Family Psychiatric History: not know or denies  Family History:  Family History  Problem Relation Age of Onset   Diabetes Father    High Cholesterol Father    Heart disease Father    Atrial fibrillation Father    Atrial fibrillation Mother    Alzheimer's disease Mother    Cancer Sister    Heart failure Sister     Social History:   Social History   Socioeconomic History   Marital status: Single    Spouse name: Not on file   Number of children: Not on file   Years of education: Not on file   Highest education level: Not on file  Occupational History   Occupation: disabled  Tobacco Use   Smoking status: Former    Current packs/day: 0.10    Average packs/day: 0.1 packs/day for 20.0 years (2.0 ttl  pk-yrs)    Types: Cigarettes   Smokeless tobacco: Never   Tobacco comments:    quit 3 months ago  Vaping Use   Vaping status: Never Used  Substance and Sexual Activity   Alcohol use: No   Drug use: No   Sexual activity: Not on file  Other Topics Concern   Not on file  Social History Narrative   Not on file   Social Drivers of Health   Financial Resource Strain: Low Risk  (11/20/2023)   Received from [facility]   Overall Financial Resource Strain (CARDIA)    How hard is it for you to pay for the very basics like food, housing, medical care, and heating?: Not hard at all  Food Insecurity: Unknown (11/20/2023)   Received from [facility]   Hunger Vital Sign    Within the  past 12 months, you worried that your food would run out before you got the money to buy more.: Never true    Within the past 12 months, the food you bought just didn't last and you didn't have money to get more.: Patient declined  Transportation Needs: No Transportation Needs (11/20/2023)   Received from [facility] - Transportation    In the past 12 months, has lack of transportation kept you from medical appointments or from getting medications?: No    In the past 12 months, has lack of transportation kept you from meetings, work, or from getting things needed for daily living?: No  Physical Activity: Inactive (11/20/2023)   Received from [facility]   Exercise Vital Sign    On average, how many days per week do you engage in moderate to strenuous exercise (like a brisk walk)?: 0 days    Minutes of Exercise per Session: Not on file  Stress: No Stress Concern Present (11/20/2023)   Received from [facility] - Occupational Stress Questionnaire    Do you feel stress - tense, restless, nervous, or anxious, or unable to sleep at night because your mind is troubled all the time - these days?: Not at all  Social Connections: Patient Declined (11/20/2023)   Received from [facility]   Social Network    How would you rate your social network (family, work, friends)?: Patient declined     Allergies:   Allergies  Allergen Reactions   Bee Venom Anaphylaxis and Swelling   Erythromycin Hives and Shortness Of Breath   Lyrica [Pregabalin] Nausea And Vomiting and Rash   Sulfa Antibiotics Rash   Tramadol Rash    Metabolic Disorder Labs: No results found for: HGBA1C, MPG No results found for: PROLACTIN No results found for: CHOL, TRIG, HDL, CHOLHDL, VLDL, LDLCALC   Current Medications: Current Outpatient Medications  Medication Sig Dispense Refill   buPROPion  ER (WELLBUTRIN  SR) 100 MG 12 hr tablet Take 1 tablet (100 mg  total) by mouth daily. 30 tablet 1   busPIRone  (BUSPAR ) 30 MG tablet TAKE 1/3 TABLET BY MOUTH THREE TIMES A DAY 90 tablet 2   Cyanocobalamin (RA VITAMIN B-12 TR) 1000 MCG TBCR Take 1,000 mcg by mouth daily.     cycloSPORINE (RESTASIS) 0.05 % ophthalmic emulsion Place 1 drop into both eyes 2 (two) times daily.     dexlansoprazole (DEXILANT) 60 MG capsule Take 60 mg by mouth every morning.      diphenoxylate -atropine  (LOMOTIL ) 2.5-0.025 MG per tablet Take 2 tablets by mouth 4 (four) times daily as needed for diarrhea  or loose stools. 20 tablet 0   EPIPEN 2-PAK 0.3 MG/0.3ML SOAJ injection Inject 0.3 mg into the muscle once. for allergic reaction  1   famotidine (PEPCID) 40 MG tablet Take 40 mg by mouth at bedtime.  0   fluticasone (FLONASE) 50 MCG/ACT nasal spray Place 1 spray into both nostrils daily.     gabapentin (NEURONTIN) 300 MG capsule Take 300 mg by mouth 3 (three) times daily.     HYDROcodone -acetaminophen  (NORCO/VICODIN) 5-325 MG per tablet 1 to 2 tabs every 4 to 6 hours as needed for pain. 20 tablet 0   lamoTRIgine  (LAMICTAL ) 150 MG tablet Take 1 tablet (150 mg total) by mouth daily. 30 tablet 0   lamoTRIgine  (LAMICTAL ) 150 MG tablet Take 1 tablet (150 mg total) by mouth daily. 90 tablet 0   Linaclotide (LINZESS) 145 MCG CAPS Take 145 mcg by mouth daily.     meloxicam (MOBIC) 15 MG tablet Take 15 mg by mouth daily.     methocarbamol  (ROBAXIN -750) 750 MG tablet Take 1 tablet (750 mg total) by mouth 4 (four) times daily. 30 tablet 0   PROAIR HFA 108 (90 BASE) MCG/ACT inhaler Inhale 2 puffs into the lungs every 6 (six) hours as needed for wheezing or shortness of breath.   4   simvastatin (ZOCOR) 20 MG tablet Take 20 mg by mouth every evening.     sucralfate (CARAFATE) 1 g tablet Take 1 g by mouth 3 (three) times daily.     traZODone  (DESYREL ) 50 MG tablet TAKE 1 AND 1/2 TABLETS (75 MG TOTAL) BY MOUTH AT BEDTIME AS NEEDED FOR SLEEP 135 tablet 0   trimethoprim (TRIMPEX) 100 MG tablet Take  100 mg by mouth daily.     No current facility-administered medications for this visit.      Psychiatric Specialty Exam: Review of Systems  Cardiovascular:  Negative for chest pain.  Musculoskeletal:  Positive for back pain.  Neurological:  Negative for tremors.  Psychiatric/Behavioral:  Negative for suicidal ideas.     There were no vitals taken for this visit.There is no height or weight on file to calculate BMI.  General Appearance: casual on wheelchair  Eye Contact:fair  Speech:  Normal Rate  Volume:  Decreased  Mood: fair  Affect: congruent  Thought Process:  Goal Directed  Orientation:  Full (Time, Place, and Person)  Thought Content:  Rumination  Suicidal Thoughts:  No  Homicidal Thoughts:  No  Memory:  Immediate;   Fair Recent;   Fair  Judgement:  Fair  Insight:  Fair  Psychomotor Activity:  Decreased  Concentration:  Concentration: Fair and Attention Span: Fair  Recall:  [employer] of Knowledge:Fair  Language: Fair  Akathisia:  Negative  Handed:  Right  AIMS (if indicated):     Assets:  Desire for Improvement  ADL's:  Intact  Cognition: WNL  Sleep:  Variable to fair    Treatment Plan Summary: Medication management and Plan as follows  Prior documentation reviewed   Depression part of Mood disorder, ; manageable continue Lamictal  there is no rash reported Wellbutrin  during the daytime GAD: Manageable continue BuSpar  and try to add activities during the daytime.  She is also on gabapentin  insomnia: Reviewed sleep hygiene continue trazodone  for sleep   Reviewed medication questions addressed follow-up in 4 to 6 months or earlier if needed   Jackey Flight, MD 11/12/202510:29 AM

## 2024-02-15 ENCOUNTER — Other Ambulatory Visit (HOSPITAL_COMMUNITY): Payer: Self-pay | Admitting: Psychiatry

## 2024-03-21 ENCOUNTER — Other Ambulatory Visit (HOSPITAL_COMMUNITY): Payer: Self-pay | Admitting: Psychiatry

## 2024-07-23 ENCOUNTER — Telehealth (HOSPITAL_COMMUNITY): Admitting: Psychiatry
